# Patient Record
Sex: Female | Born: 1995 | Race: Black or African American | Hispanic: No | Marital: Single | Smoking: Former smoker
Health system: Southern US, Community
[De-identification: ages and names within clinical notes are randomized; demographics above are authoritative.]

## PROBLEM LIST (undated history)

## (undated) ENCOUNTER — Inpatient Hospital Stay: Payer: Self-pay

## (undated) DIAGNOSIS — R519 Headache, unspecified: Secondary | ICD-10-CM

## (undated) DIAGNOSIS — R109 Unspecified abdominal pain: Secondary | ICD-10-CM

## (undated) DIAGNOSIS — J45909 Unspecified asthma, uncomplicated: Secondary | ICD-10-CM

## (undated) DIAGNOSIS — K5900 Constipation, unspecified: Secondary | ICD-10-CM

## (undated) DIAGNOSIS — Z8619 Personal history of other infectious and parasitic diseases: Secondary | ICD-10-CM

## (undated) DIAGNOSIS — K602 Anal fissure, unspecified: Secondary | ICD-10-CM

## (undated) DIAGNOSIS — R51 Headache: Secondary | ICD-10-CM

## (undated) HISTORY — DX: Constipation, unspecified: K59.00

## (undated) HISTORY — DX: Personal history of other infectious and parasitic diseases: Z86.19

## (undated) HISTORY — DX: Unspecified abdominal pain: R10.9

## (undated) HISTORY — DX: Anal fissure, unspecified: K60.2

## (undated) HISTORY — PX: KNEE SURGERY: SHX244

---
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
## 2016-01-08 ENCOUNTER — Ambulatory Visit (INDEPENDENT_AMBULATORY_CARE_PROVIDER_SITE_OTHER): Payer: BLUE CROSS/BLUE SHIELD

## 2016-01-08 ENCOUNTER — Encounter: Payer: BLUE CROSS/BLUE SHIELD | Admitting: Obstetrics and Gynecology

## 2016-01-08 DIAGNOSIS — Z3492 Encounter for supervision of normal pregnancy, unspecified, second trimester: Secondary | ICD-10-CM | POA: Diagnosis not present

## 2016-01-15 ENCOUNTER — Encounter: Payer: BLUE CROSS/BLUE SHIELD | Admitting: Obstetrics and Gynecology

## 2016-01-24 ENCOUNTER — Encounter: Payer: Self-pay | Admitting: Emergency Medicine

## 2016-01-24 ENCOUNTER — Emergency Department
Admission: EM | Admit: 2016-01-24 | Discharge: 2016-01-24 | Disposition: A | Discharge status: Home / Self Care | Payer: BLUE CROSS/BLUE SHIELD | Attending: Emergency Medicine | Admitting: Emergency Medicine

## 2016-01-24 DIAGNOSIS — Z79899 Other long term (current) drug therapy: Secondary | ICD-10-CM | POA: Diagnosis not present

## 2016-01-24 DIAGNOSIS — J45909 Unspecified asthma, uncomplicated: Secondary | ICD-10-CM | POA: Diagnosis not present

## 2016-01-24 DIAGNOSIS — R55 Syncope and collapse: Secondary | ICD-10-CM | POA: Diagnosis not present

## 2016-01-24 DIAGNOSIS — M545 Low back pain: Secondary | ICD-10-CM | POA: Insufficient documentation

## 2016-01-24 DIAGNOSIS — O26892 Other specified pregnancy related conditions, second trimester: Secondary | ICD-10-CM | POA: Diagnosis present

## 2016-01-24 DIAGNOSIS — Z3A21 21 weeks gestation of pregnancy: Secondary | ICD-10-CM | POA: Insufficient documentation

## 2016-01-24 LAB — BASIC METABOLIC PANEL
Anion gap: 7 (ref 5–15)
BUN: 7 mg/dL (ref 6–20)
CHLORIDE: 104 mmol/L (ref 101–111)
CO2: 23 mmol/L (ref 22–32)
CREATININE: 0.6 mg/dL (ref 0.44–1.00)
Calcium: 9.1 mg/dL (ref 8.9–10.3)
Glucose, Bld: 113 mg/dL — ABNORMAL HIGH (ref 65–99)
POTASSIUM: 3.8 mmol/L (ref 3.5–5.1)
SODIUM: 134 mmol/L — AB (ref 135–145)

## 2016-01-24 LAB — URINALYSIS COMPLETE WITH MICROSCOPIC (ARMC ONLY)
Bacteria, UA: NONE SEEN
Bilirubin Urine: NEGATIVE
Glucose, UA: NEGATIVE mg/dL
HGB URINE DIPSTICK: NEGATIVE
KETONES UR: NEGATIVE mg/dL
LEUKOCYTES UA: NEGATIVE
NITRITE: NEGATIVE
PH: 7 (ref 5.0–8.0)
PROTEIN: NEGATIVE mg/dL
RBC / HPF: NONE SEEN RBC/hpf (ref 0–5)
SPECIFIC GRAVITY, URINE: 1.001 — AB (ref 1.005–1.030)
SQUAMOUS EPITHELIAL / LPF: NONE SEEN
WBC, UA: NONE SEEN WBC/hpf (ref 0–5)

## 2016-01-24 LAB — CBC
HCT: 33.6 % — ABNORMAL LOW (ref 35.0–47.0)
Hemoglobin: 10.8 g/dL — ABNORMAL LOW (ref 12.0–16.0)
MCH: 23.3 pg — ABNORMAL LOW (ref 26.0–34.0)
MCHC: 32.1 g/dL (ref 32.0–36.0)
MCV: 72.7 fL — AB (ref 80.0–100.0)
PLATELETS: 275 10*3/uL (ref 150–440)
RBC: 4.62 MIL/uL (ref 3.80–5.20)
RDW: 17.1 % — ABNORMAL HIGH (ref 11.5–14.5)
WBC: 13.5 10*3/uL — AB (ref 3.6–11.0)

## 2016-01-24 NOTE — Discharge Instructions (Signed)
Please drink plenty of fluids. Please eat many small snacks throughout the day. Please follow-up with your OB/GYN as soon as possible for repeat evaluation. Return to the emergency department for any further syncopal events over (passing out) or if you develop any chest pain, trouble breathing, or any other symptom personally concerning to yourself.

## 2016-01-24 NOTE — ED Provider Notes (Addendum)
[facility] Emergency Department Provider Note  Time seen: 8:18 PM  I have reviewed the triage vital signs and the nursing notes.   HISTORY  Chief Complaint Loss of Consciousness    HPI [name] [name] is a 20 y.o. female G1 P0 at 21 weeks he presents the emergency departmentafter a syncopal episode. According to the patient she was at work around 5 PM she began feeling lightheaded and dizzy. She sat down and his symptoms seemed to go away. However approximately 10 minutes later after standing she once again felt lightheaded and dizzy and had a syncopal episode. Patient denies passing out in the past. Denies any chest pain or shortness of breath at any point. Denies any abdominal pain or vaginal bleeding or fluid leakage. Denies dysuria.  Past Medical History:  Diagnosis Date  . Abdominal pain   . Anal fissure   . Asthma   . Constipation   . H/O chlamydia infection 10/30/2015    Patient Active Problem List   Diagnosis Date Noted  . Positive urine drug screen 11/19/2015  . H/O chlamydia infection 10/30/2015  . Asthma affecting pregnancy, antepartum 10/30/2015  . Bilateral lower abdominal pain   . Constipation   . Anal fissure     Past Surgical History:  Procedure Laterality Date  . KNEE SURGERY Left 2014, 2015    Prior to Admission medications   Medication Sig Start Date End Date Taking? Authorizing Provider  acetaminophen (TYLENOL) 325 MG tablet Take 2 tablets (650 mg total) by mouth every 4 (four) hours as needed. 07/02/15 07/01/16  Jami L Hagler, PA-C  albuterol (PROVENTIL HFA;VENTOLIN HFA) 108 (90 Base) MCG/ACT inhaler Inhale 1-2 puffs into the lungs every 4 (four) hours as needed for wheezing or shortness of breath. 10/30/15   Hildred LaserAnika Cherry, MD  Prenat-FeFmCb-DSS-FA-DHA w/o A (CITRANATAL HARMONY) 27-1-260 MG CAPS Take 27 mg by mouth daily. 10/30/15   Hildred LaserAnika Cherry, MD    Allergies  Allergen Reactions  . Ibuprofen Nausea Only    Family History   Problem Relation Age of Onset  . Cancer Sister   . Seizures Sister   . Migraines Mother   . Diabetes Paternal Grandmother   . Celiac disease Neg Hx   . Cholelithiasis Neg Hx   . Ulcers Neg Hx     Social History Social History  Substance Use Topics  . Smoking status: Never Smoker  . Smokeless tobacco: Never Used  . Alcohol use No    Review of Systems Constitutional: Negative for fever Cardiovascular: Negative for chest pain. Respiratory: Negative for shortness of breath. Gastrointestinal: Negative for abdominal pain Genitourinary: Negative for dysuria. Musculoskeletal: Mild right lower back pain Neurological: Negative for headache 10-point ROS otherwise negative.  ____________________________________________   PHYSICAL EXAM:  VITAL SIGNS: ED Triage Vitals  Enc Vitals Group     BP 01/24/16 1852 (!) 122/58     Pulse Rate 01/24/16 1852 84     Resp 01/24/16 1852 15     Temp 01/24/16 1852 97.8 F (36.6 C)     Temp Source 01/24/16 1852 Oral     SpO2 01/24/16 1852 100 %     Weight 01/24/16 1853 150 lb (68 kg)     Height 01/24/16 1853 5\' 3"  (1.6 m)     Head Circumference --      Peak Flow --      Pain Score 01/24/16 1852 8     Pain Loc --      Pain Edu? --  Excl. in GC? --     Constitutional: Alert and oriented. Well appearing and in no distress. Eyes: Normal exam ENT   Head: Normocephalic and atraumatic.   Mouth/Throat: Mucous membranes are moist. Cardiovascular: Normal rate, regular rhythm. No murmur Respiratory: Normal respiratory effort without tachypnea nor retractions. Breath sounds are clear  Gastrointestinal: Soft and nontender. Gravid abdomen. Musculoskeletal: Mild tenderness palpation of patient's right lower back, mild erythema to believe she hit something when she passed out. No obvious ecchymosis. Minimal tenderness. Neurologic:  Normal speech and language. No gross focal neurologic deficits Skin:  Skin is warm, dry and intact.   Psychiatric: Mood and affect are normal. Speech and behavior are normal.   ____________________________________________  EKG reviewed and interpreted by myself shows normal sinus rhythm 87 bpm, narrow QRS, normal axis, normal intervals, no concerning ST changes.  INITIAL IMPRESSION / ASSESSMENT AND PLAN / ED COURSE  Pertinent labs & imaging results that were available during my care of the patient were reviewed by me and considered in my medical decision making (see chart for details).  Patient presents the emergency department after a syncopal episode. Patient's labs are largely at her baseline. Mild anemia, but again largely unchanged. Bedside ultrasound performed by myself shows a normal fetal heart rate of 150 bpm on M-mode. Good fetal movement, overall normal exam. We will check a urinalysis. I discussed with the patient importance of adequately hydrating during her second trimester. I also discussed the importance of sitting down if she feels lightheaded or dizzy. Patient is agreeable follow up with her OB.  Urinalysis is negative. Patient will be discharged home with OB follow-up. Patient and mother are agreeable.  ____________________________________________   FINAL CLINICAL IMPRESSION(S) / ED DIAGNOSES  Syncope    Minna AntisKevin Jasiel Belisle, MD 01/24/16 2050    Minna AntisKevin James Senn, MD 01/24/16 2053

## 2016-01-24 NOTE — ED Notes (Addendum)
Pt. States while standing and feeling light headed and weak, so sat and resting about 10 minutes, felt fine, got up and about 15 minutes later had a syncopal episode with no warning prior to as in the first episode.

## 2016-01-24 NOTE — ED Triage Notes (Signed)
Pt to triage via w/c with no distress noted; reports PTA while at work had syncopal episode x 2, 15min apart; c/o generalized HA at present; denies hx of same; [redacted]wks pregnant; G1, [facility]; denies any pregnancy c/o

## 2016-01-24 NOTE — ED Notes (Signed)
Pt. Verbalizes understanding of d/c instructions and follow-up. VS stable and pt denies pain.  Pt. In NAD at time of d/c and denies further concerns regarding this visit. Pt. Stable at the time of departure from the unit, departing unit by the safest and most appropriate manner per that pt condition and limitations. Pt advised to return to the ED at any time for emergent concerns, or for new/worsening symptoms.   

## 2016-01-25 ENCOUNTER — Ambulatory Visit (INDEPENDENT_AMBULATORY_CARE_PROVIDER_SITE_OTHER): Payer: BLUE CROSS/BLUE SHIELD | Admitting: Obstetrics and Gynecology

## 2016-01-25 VITALS — BP 121/71 | HR 93 | Wt 156.6 lb

## 2016-01-25 DIAGNOSIS — R51 Headache: Secondary | ICD-10-CM

## 2016-01-25 DIAGNOSIS — Z3492 Encounter for supervision of normal pregnancy, unspecified, second trimester: Secondary | ICD-10-CM

## 2016-01-25 DIAGNOSIS — O26892 Other specified pregnancy related conditions, second trimester: Secondary | ICD-10-CM

## 2016-01-25 DIAGNOSIS — R519 Headache, unspecified: Secondary | ICD-10-CM

## 2016-01-25 LAB — POCT URINALYSIS DIPSTICK
Bilirubin, UA: NEGATIVE
Glucose, UA: NEGATIVE
Ketones, UA: NEGATIVE
Leukocytes, UA: NEGATIVE
NITRITE UA: NEGATIVE
PH UA: 6.5
RBC UA: NEGATIVE
SPEC GRAV UA: 1.015
UROBILINOGEN UA: 0.2

## 2016-01-25 MED ORDER — BUTALBITAL-APAP-CAFFEINE 50-325-40 MG PO CAPS: 1.0000 | ORAL_CAPSULE | Freq: Four times a day (QID) | ORAL | 3 refills | Status: DC | PRN

## 2016-01-25 NOTE — Progress Notes (Signed)
ROB- reports feeling better since yesterday except for persistent HA, has been having them every other day in pregnancy- RX for Fioricet given. Reviewed normal anatomy scan.

## 2016-01-25 NOTE — Patient Instructions (Signed)

## 2016-01-25 NOTE — Progress Notes (Signed)
ROB-pt passed out yesterday @ work x 2 , went to ER, she is having some headaches

## 2016-02-29 ENCOUNTER — Ambulatory Visit (INDEPENDENT_AMBULATORY_CARE_PROVIDER_SITE_OTHER): Payer: BLUE CROSS/BLUE SHIELD | Admitting: Obstetrics and Gynecology

## 2016-02-29 VITALS — BP 118/70 | HR 92 | Wt 164.2 lb

## 2016-02-29 DIAGNOSIS — Z3492 Encounter for supervision of normal pregnancy, unspecified, second trimester: Secondary | ICD-10-CM

## 2016-02-29 LAB — POCT URINALYSIS DIPSTICK
Bilirubin, UA: NEGATIVE
Blood, UA: NEGATIVE
Glucose, UA: NEGATIVE
Ketones, UA: NEGATIVE
Nitrite, UA: NEGATIVE
PH UA: 6.5
SPEC GRAV UA: 1.015
UROBILINOGEN UA: 0.2

## 2016-02-29 NOTE — Progress Notes (Signed)
ROB- pt is doing well, having some low back pain

## 2016-02-29 NOTE — Progress Notes (Signed)
ROB-pt doing well. C/O of intermittent back pain. Discussed home care measures including abdominal support, proper body mechanics, and use of OTC medications. Glucola and H/H next visit, pt aware.

## 2016-03-14 ENCOUNTER — Other Ambulatory Visit: Payer: BLUE CROSS/BLUE SHIELD

## 2016-03-14 ENCOUNTER — Ambulatory Visit (INDEPENDENT_AMBULATORY_CARE_PROVIDER_SITE_OTHER): Payer: BLUE CROSS/BLUE SHIELD | Admitting: Certified Nurse Midwife

## 2016-03-14 VITALS — BP 121/64 | HR 82 | Wt 165.4 lb

## 2016-03-14 DIAGNOSIS — Z131 Encounter for screening for diabetes mellitus: Secondary | ICD-10-CM

## 2016-03-14 DIAGNOSIS — Z23 Encounter for immunization: Secondary | ICD-10-CM | POA: Diagnosis not present

## 2016-03-14 DIAGNOSIS — Z3493 Encounter for supervision of normal pregnancy, unspecified, third trimester: Secondary | ICD-10-CM

## 2016-03-14 NOTE — Progress Notes (Signed)
ROB-Pt doing well. C/o round ligament pain. Reviewed home care measures like proper body mechanics and abdominal support. Discussed daily kick counts, danger signs, and when to call. Class schedule and cord blood banking information given. Pt for A1C and H/H today. RTC x 2 weeks or sooner if needed.

## 2016-03-14 NOTE — Addendum Note (Signed)
Addended by: Brooke DareSICK, Lenia Housley L on: 03/14/2016 10:25 AM   Modules accepted: Orders

## 2016-03-14 NOTE — Patient Instructions (Signed)
Introduction Patient Name: ________________________________________________ Patient Due Date: ____________________ What is a fetal movement count? A fetal movement count is the number of times that you feel your baby move during a certain amount of time. This may also be called a fetal kick count. A fetal movement count is recommended for every pregnant woman. You may be asked to start counting fetal movements as early as week 28 of your pregnancy. Pay attention to when your baby is most active. You may notice your baby's sleep and wake cycles. You may also notice things that make your baby move more. You should do a fetal movement count:  When your baby is normally most active.  At the same time each day. A good time to count movements is while you are resting, after having something to eat and drink. How do I count fetal movements? 1. Find a quiet, comfortable area. Sit, or lie down on your side. 2. Write down the date, the start time and stop time, and the number of movements that you felt between those two times. Take this information with you to your health care visits. 3. For 2 hours, count kicks, flutters, swishes, rolls, and jabs. You should feel at least 10 movements during 2 hours. 4. You may stop counting after you have felt 10 movements. 5. If you do not feel 10 movements in 2 hours, have something to eat and drink. Then, keep resting and counting for 1 hour. If you feel at least 4 movements during that hour, you may stop counting. Contact a health care provider if:  You feel fewer than 4 movements in 2 hours.  Your baby is not moving like he or she usually does. Date: ____________ Start time: ____________ Stop time: ____________ Movements: ____________ Date: ____________ Start time: ____________ Stop time: ____________ Movements: ____________ Date: ____________ Start time: ____________ Stop time: ____________ Movements: ____________ Date: ____________ Start time: ____________  Stop time: ____________ Movements: ____________ Date: ____________ Start time: ____________ Stop time: ____________ Movements: ____________ Date: ____________ Start time: ____________ Stop time: ____________ Movements: ____________ Date: ____________ Start time: ____________ Stop time: ____________ Movements: ____________ Date: ____________ Start time: ____________ Stop time: ____________ Movements: ____________ Date: ____________ Start time: ____________ Stop time: ____________ Movements: ____________ This information is not intended to replace advice given to you by your health care provider. Make sure you discuss any questions you have with your health care provider. Document Released: 04/02/2006 Document Revised: 10/31/2015 Document Reviewed: 04/12/2015 Elsevier Interactive Patient Education  2017 [employer]    Preterm Labor and Birth Information Pregnancy normally lasts 39-41 weeks. Preterm labor is when labor starts early. It starts before you have been pregnant for 37 whole weeks. What are the risk factors for preterm labor? Preterm labor is more likely to occur in women who:  Have an infection while pregnant.  Have a cervix that is short.  Have gone into preterm labor before.  Have had surgery on their cervix.  Are younger than age 19.  Are older than age 435.  Are African American.  Are pregnant with two or more babies.  Take street drugs while pregnant.  Smoke while pregnant.  Do not gain enough weight while pregnant.  Got pregnant right after another pregnancy. What are the symptoms of preterm labor? Symptoms of preterm labor include:  Cramps. The cramps may feel like the cramps some women get during their period. The cramps may happen with watery poop (diarrhea).  Pain in the belly (abdomen).  Pain in the lower back.  Regular contractions or tightening. It may feel like your belly is getting tighter.  Pressure in the lower belly that seems to get  stronger.  More fluid (discharge) leaking from the vagina. The fluid may be watery or bloody.  Water breaking. Why is it important to notice signs of preterm labor? Babies who are born early may not be fully developed. They have a higher chance for:  Long-term heart problems.  Long-term lung problems.  Trouble controlling body systems, like breathing.  Bleeding in the brain.  A condition called cerebral palsy.  Learning difficulties.  Death. These risks are highest for babies who are born before 34 weeks of pregnancy. How is preterm labor treated? Treatment depends on:  How long you were pregnant.  Your condition.  The health of your baby. Treatment may involve:  Having a stitch (suture) placed in your cervix. When you give birth, your cervix opens so the baby can come out. The stitch keeps the cervix from opening too soon.  Staying at the hospital.  Taking or getting medicines, such as:  Hormone medicines.  Medicines to stop contractions.  Medicines to help the baby's lungs develop.  Medicines to prevent your baby from having cerebral palsy. What should I do if I am in preterm labor? If you think you are going into labor too soon, call your doctor right away. How can I prevent preterm labor?  Do not use any tobacco products.  Examples of these are cigarettes, chewing tobacco, and e-cigarettes.  If you need help quitting, ask your doctor.  Do not use street drugs.  Do not use any medicines unless you ask your doctor if they are safe for you.  Talk with your doctor before taking any herbal supplements.  Make sure you gain enough weight.  Watch for infection. If you think you might have an infection, get it checked right away.  If you have gone into preterm labor before, tell your doctor. This information is not intended to replace advice given to you by your health care provider. Make sure you discuss any questions you have with your health care  provider. Document Released: 05/30/2008 Document Revised: 08/14/2015 Document Reviewed: 07/25/2015 Elsevier Interactive Patient Education  2017 [employer]   Third Trimester of Pregnancy The third trimester is from week 29 through week 42, months 7 through 9. This trimester is when your unborn baby (fetus) is growing very fast. At the end of the ninth month, the unborn baby is about 20 inches in length. It weighs about 6-10 pounds. Follow these instructions at home:  Avoid all smoking, herbs, and alcohol. Avoid drugs not approved by your doctor.  Do not use any tobacco products, including cigarettes, chewing tobacco, and electronic cigarettes. If you need help quitting, ask your doctor. You may get counseling or other support to help you quit.  Only take medicine as told by your doctor. Some medicines are safe and some are not during pregnancy.  Exercise only as told by your doctor. Stop exercising if you start having cramps.  Eat regular, healthy meals.  Wear a good support bra if your breasts are tender.  Do not use hot tubs, steam rooms, or saunas.  Wear your seat belt when driving.  Avoid raw meat, uncooked cheese, and liter boxes and soil used by cats.  Take your prenatal vitamins.  Take 1500-2000 milligrams of calcium daily starting at the 20th week of pregnancy until you deliver your baby.  Try taking medicine that helps you poop (  stool softener) as needed, and if your doctor approves. Eat more fiber by eating fresh fruit, vegetables, and whole grains. Drink enough fluids to keep your pee (urine) clear or pale yellow.  Take warm water baths (sitz baths) to soothe pain or discomfort caused by hemorrhoids. Use hemorrhoid cream if your doctor approves.  If you have puffy, bulging veins (varicose veins), wear support hose. Raise (elevate) your feet for 15 minutes, 3-4 times a day. Limit salt in your diet.  Avoid heavy lifting, wear low heels, and sit up straight.  Rest  with your legs raised if you have leg cramps or low back pain.  Visit your dentist if you have not gone during your pregnancy. Use a soft toothbrush to brush your teeth. Be gentle when you floss.  You can have sex (intercourse) unless your doctor tells you not to.  Do not travel far distances unless you must. Only do so with your doctor's approval.  Take prenatal classes.  Practice driving to the hospital.  Pack your hospital bag.  Prepare the baby's room.  Go to your doctor visits. Get help if:  You are not sure if you are in labor or if your water has broken.  You are dizzy.  You have mild cramps or pressure in your lower belly (abdominal).  You have a nagging pain in your belly area.  You continue to feel sick to your stomach (nauseous), throw up (vomit), or have watery poop (diarrhea).  You have bad smelling fluid coming from your vagina.  You have pain with peeing (urination). Get help right away if:  You have a fever.  You are leaking fluid from your vagina.  You are spotting or bleeding from your vagina.  You have severe belly cramping or pain.  You lose or gain weight rapidly.  You have trouble catching your breath and have chest pain.  You notice sudden or extreme puffiness (swelling) of your face, hands, ankles, feet, or legs.  You have not felt the baby move in over an hour.  You have severe headaches that do not go away with medicine.  You have vision changes. This information is not intended to replace advice given to you by your health care provider. Make sure you discuss any questions you have with your health care provider. Document Released: 05/28/2009 Document Revised: 08/09/2015 Document Reviewed: 05/04/2012 Elsevier Interactive Patient Education  2017 [employer].

## 2016-03-15 LAB — HEMOGLOBIN AND HEMATOCRIT, BLOOD
Hematocrit: 38.7 % (ref 34.0–46.6)
Hemoglobin: 13.6 g/dL (ref 11.1–15.9)

## 2016-03-15 LAB — GLUCOSE, 1 HOUR GESTATIONAL: Gestational Diabetes Screen: 111 mg/dL (ref 65–139)

## 2016-03-17 NOTE — L&D Delivery Note (Signed)
Delivery Note   Pt complete and ready to push. Coached maternal pushing efforts effective.   At 3:44 AM a viable female was delivered. Infant placed immediately to maternal abdomen. Receiving Nurse and NNP present at delivery. Delayed cord clamping, three (3) vessel cord, cord blood collected. APGAR: 8, 9; weight 8 lb 10.6 oz (3930 g). Spontaneous delivery of placenta at 0349.   Anesthesia:  Epidural Episiotomy: None Lacerations: Left labial repaired; first degree laceration hemostatic-unrepaired Suture Repair: vicryl rapide Est. Blood Loss (mL):  300  Mom to postpartum.  Baby to Couplet care / Skin to Skin.  [name] RoyalsMichelle Lawhorn 06/06/2016, 4:21 AM

## 2016-03-28 ENCOUNTER — Ambulatory Visit (INDEPENDENT_AMBULATORY_CARE_PROVIDER_SITE_OTHER): Payer: BLUE CROSS/BLUE SHIELD | Admitting: Certified Nurse Midwife

## 2016-03-28 VITALS — BP 116/56 | HR 88 | Wt 172.1 lb

## 2016-03-28 DIAGNOSIS — Z3493 Encounter for supervision of normal pregnancy, unspecified, third trimester: Secondary | ICD-10-CM

## 2016-03-28 LAB — POCT URINALYSIS DIPSTICK
Bilirubin, UA: NEGATIVE
Blood, UA: NEGATIVE
GLUCOSE UA: NEGATIVE
Ketones, UA: NEGATIVE
LEUKOCYTES UA: NEGATIVE
NITRITE UA: NEGATIVE
Protein, UA: NEGATIVE
Spec Grav, UA: 1.015
UROBILINOGEN UA: NEGATIVE
pH, UA: 5

## 2016-03-28 NOTE — Progress Notes (Signed)
ROB-Pt doing well. Work note given for frequent breaks and no prolonged standing. Reviewed danger signs and when to call. Discussed use of US in normal pregnancy. Class schedule and cord blood banking information given again. RTC x 2 wks or sooner if needed.

## 2016-03-28 NOTE — Progress Notes (Signed)
Pt has no c/o

## 2016-04-06 ENCOUNTER — Encounter: Payer: Self-pay | Admitting: *Deleted

## 2016-04-06 ENCOUNTER — Inpatient Hospital Stay
Admission: EM | Admit: 2016-04-06 | Discharge: 2016-04-07 | Disposition: A | Discharge status: Home / Self Care | Payer: BLUE CROSS/BLUE SHIELD | Attending: Obstetrics & Gynecology | Admitting: Obstetrics & Gynecology

## 2016-04-06 DIAGNOSIS — O99513 Diseases of the respiratory system complicating pregnancy, third trimester: Secondary | ICD-10-CM | POA: Diagnosis not present

## 2016-04-06 DIAGNOSIS — R1032 Left lower quadrant pain: Secondary | ICD-10-CM | POA: Insufficient documentation

## 2016-04-06 DIAGNOSIS — O26893 Other specified pregnancy related conditions, third trimester: Secondary | ICD-10-CM | POA: Insufficient documentation

## 2016-04-06 DIAGNOSIS — Z3A31 31 weeks gestation of pregnancy: Secondary | ICD-10-CM | POA: Diagnosis not present

## 2016-04-06 DIAGNOSIS — R1031 Right lower quadrant pain: Secondary | ICD-10-CM | POA: Insufficient documentation

## 2016-04-06 DIAGNOSIS — J45909 Unspecified asthma, uncomplicated: Secondary | ICD-10-CM | POA: Diagnosis not present

## 2016-04-06 DIAGNOSIS — Z8619 Personal history of other infectious and parasitic diseases: Secondary | ICD-10-CM | POA: Insufficient documentation

## 2016-04-06 DIAGNOSIS — O4703 False labor before 37 completed weeks of gestation, third trimester: Secondary | ICD-10-CM | POA: Diagnosis present

## 2016-04-06 HISTORY — DX: Headache, unspecified: R51.9

## 2016-04-06 HISTORY — DX: Headache: R51

## 2016-04-06 LAB — FETAL FIBRONECTIN: Fetal Fibronectin: NEGATIVE

## 2016-04-06 LAB — URINALYSIS, ROUTINE W REFLEX MICROSCOPIC
Bilirubin Urine: NEGATIVE
GLUCOSE, UA: NEGATIVE mg/dL
Hgb urine dipstick: NEGATIVE
KETONES UR: 5 mg/dL — AB
Nitrite: NEGATIVE
PROTEIN: NEGATIVE mg/dL
Specific Gravity, Urine: 1.002 — ABNORMAL LOW (ref 1.005–1.030)
pH: 7 (ref 5.0–8.0)

## 2016-04-06 MED ORDER — LACTATED RINGERS IV SOLN
INTRAVENOUS | Status: DC
  Administered 2016-04-06: 20:00:00 via INTRAVENOUS

## 2016-04-06 MED ORDER — TERBUTALINE SULFATE 1 MG/ML IJ SOLN
0.2500 mg | Freq: Once | INTRAMUSCULAR | Status: AC
  Administered 2016-04-06: 0.25 mg via SUBCUTANEOUS
  Filled 2016-04-06: qty 1

## 2016-04-06 NOTE — Discharge Summary (Signed)
[name] [name] is a 21 y.o. G1P0 at 6669w4d who is admitted for preterm contractions.  Estimated Date of Delivery: 06/04/16 Fetal presentation is unsure.  Length of Stay:  0 Days. Admitted 04/06/2016  Subjective: Reports no pain or cramping since tocolytics given Patient reports good fetal movement.  She reports no uterine contractions, no bleeding and no loss of fluid per vagina.  Vitals:  Blood pressure 121/64, pulse 86, temperature 98.2 F (36.8 C), temperature source Oral, resp. rate 16, height 5\' 3"  (1.6 m), weight 172 lb (78 kg), last menstrual period 08/29/2015. Physical Examination: CONSTITUTIONAL: Well-developed, well-nourished female in no acute distress.  SKIN: Skin is warm and dry. No rash noted. Not diaphoretic. No erythema. No pallor. NEUROLGIC: Alert and oriented to person, place, and time. Normal reflexes, muscle tone coordination. No cranial nerve deficit noted. PSYCHIATRIC: Normal mood and affect. Normal behavior. Normal judgment and thought content. CARDIOVASCULAR: Normal heart rate noted, regular rhythm RESPIRATORY: Effort and breath sounds normal, no problems with respiration noted MUSCULOSKELETAL: Normal range of motion. No edema and no tenderness. 2+ distal pulses. ABDOMEN: Soft, nontender, nondistended, gravid. CERVIX: Dilation: Fingertip Effacement (%): Thick Exam by:: m.Larsen Dungan cnm  Fetal monitoring: FHR: 1** bpm, Variability: moderate, Accelerations: Present, Decelerations: Absent  Uterine activity: none in last hour, previously q4-5 minutes. contractions per hour  Results for orders placed or performed during the hospital encounter of 04/06/16 (from the past 48 hour(s))  Urinalysis, Routine w reflex microscopic     Status: Abnormal   Collection Time: 04/06/16  6:33 PM  Result Value Ref Range   Color, Urine STRAW (A) YELLOW   APPearance HAZY (A) CLEAR   Specific Gravity, Urine 1.002 (L) 1.005 - 1.030   pH 7.0 5.0 - 8.0   Glucose, UA NEGATIVE NEGATIVE mg/dL   Hgb urine dipstick NEGATIVE NEGATIVE   Bilirubin Urine NEGATIVE NEGATIVE   Ketones, ur 5 (A) NEGATIVE mg/dL   Protein, ur NEGATIVE NEGATIVE mg/dL   Nitrite NEGATIVE NEGATIVE   Leukocytes, UA LARGE (A) NEGATIVE   RBC / HPF 6-30 0 - 5 RBC/hpf   WBC, UA TOO NUMEROUS TO COUNT 0 - 5 WBC/hpf   Bacteria, UA RARE (A) NONE SEEN   Squamous Epithelial / LPF 0-5 (A) NONE SEEN   Amorphous Crystal PRESENT   Fetal fibronectin     Status: Abnormal   Collection Time: 04/06/16  7:14 PM  Result Value Ref Range   Fetal Fibronectin NEGATIVE NEGATIVE   Appearance, FETFIB CLEAR (A) CLEAR    No results found.  Current scheduled medications   I have reviewed the patient's current medications.  ASSESSMENT: Patient Active Problem List   Diagnosis Date Noted  . Headache in pregnancy, antepartum, second trimester 01/25/2016  . Positive urine drug screen 11/19/2015  . H/O chlamydia infection 10/30/2015  . Asthma affecting pregnancy, antepartum 10/30/2015  . Bilateral lower abdominal pain   . Constipation   . Anal fissure     PLAN:  Continue routine antenatal  Pelvic rest until futher notice  Shawnelle Spoerl Suzan NailerN Stockton Nunley, CNM [facility]

## 2016-04-06 NOTE — OB Triage Provider Note (Signed)
Subjective:  [name] [name] is a 21 y.o. G1 P0 female with EDC 06/04/16 at 331 and 4/[redacted] weeks gestation who is being admitted for observation, Preterm labor.  Her current obstetrical history is significant for nothing to date.  Patient reports bleeding, occasional contractions and decreased appetite since nine pm last night after she got off work. noted a small amount dark blood on tissue and none since, but woke up with intermittent cramping all day. denies sex in over a week, and nothing else different..   Fetal Movement: normal.     Objective:   Vital signs in last 24 hours: Temp:  [98.3 F (36.8 C)] 98.3 F (36.8 C) (01/21 1812) Pulse Rate:  [86] 86 (01/21 1812) Resp:  [16] 16 (01/21 1812) BP: (121)/(64) 121/64 (01/21 1812) Weight:  [172 lb (78 kg)] 172 lb (78 kg) (01/21 1812)   General:   alert, cooperative and appears stated age  Skin:   normal  HEENT:  PERRLA  Lungs:   clear to auscultation bilaterally  Heart:   regular rate and rhythm, S1, S2 normal, no murmur, click, rub or gallop  Reflexes  No peripheral edema noted on exam.  Abdomen:  soft, non-tender; bowel sounds normal; no masses,  no organomegaly  Pelvis:  Vulva and vagina appear normal. Bimanual exam reveals normal uterus and adnexa. Cervix: 0.5/th/OOP  FHT:  148 BPM  Uterine Size: size equals dates  Presentations: unsure  Cervix:    Dilation: FT   Effacement: 100%   Station:  Floating   Consistency: medium   Position: middle   Lab Review  O, Rh+  AFP:NML  One hour GTT: Normal    Assessment/Plan:  31 and 4/[redacted] weeks gestation. Pre-term contractions Obstetrical history significant for none.     Risks, benefits, alternatives and possible complications have been discussed in detail with the patient.  Normal causes of pre-term contractions including UTI, vaginal infection, & dehydration discussed, will awaiting results of UA & FFN and treat accordingly/  All questions answered, all appropriate consents will be  signed at the Hospital. Admission is planned for ?Marland Kitchen.  Continue present management. and Intervention: IV fluid bolus and administer tocolytic      Cace Osorto Aura CampsShambley, CNM [facility]

## 2016-04-06 NOTE — OB Triage Note (Signed)
Patient states that she started having some maroon colored bleeding noted on toilet paper around 9pm Saturday night.  Blood not seen this morning.  Complains of lower abdominal pain that started when she noticed the blood.  She rates her pain 8/10. She took 650 mg Tylenol last night without any improvement.

## 2016-04-07 NOTE — Discharge Instructions (Signed)
Drink plenty of water, get plenty of rest. Maintain pelvic rest (nothing in vagina) until follow up appointment.

## 2016-04-07 NOTE — Progress Notes (Signed)
Bag of fluid complete and discharge orders received from provider. Patient denies pain at this time. IV removed. Discharge instructions reviewed with patient, patient verbalizes understanding of pelvic rest, will plan to be at follow up appointment Friday 04/11/2016. Patient denies needs at this time, ambulatory off unit in stable condition accompanied by mother.

## 2016-04-08 LAB — CULTURE, OB URINE

## 2016-04-11 ENCOUNTER — Ambulatory Visit (INDEPENDENT_AMBULATORY_CARE_PROVIDER_SITE_OTHER): Payer: BLUE CROSS/BLUE SHIELD | Admitting: Certified Nurse Midwife

## 2016-04-11 VITALS — BP 123/78 | HR 100 | Wt 175.7 lb

## 2016-04-11 DIAGNOSIS — Z3403 Encounter for supervision of normal first pregnancy, third trimester: Secondary | ICD-10-CM

## 2016-04-11 DIAGNOSIS — Z113 Encounter for screening for infections with a predominantly sexual mode of transmission: Secondary | ICD-10-CM

## 2016-04-11 LAB — POCT URINALYSIS DIPSTICK
Bilirubin, UA: NEGATIVE
Glucose, UA: NEGATIVE
KETONES UA: NEGATIVE
Nitrite, UA: NEGATIVE
SPEC GRAV UA: 1.01
UROBILINOGEN UA: NEGATIVE
pH, UA: 7

## 2016-04-11 NOTE — Patient Instructions (Addendum)
Sciatica Introduction Sciatica is pain, numbness, weakness, or tingling along your sciatic nerve. The sciatic nerve starts in the lower back and goes down the back of each leg. Sciatica happens when this nerve is pinched or has pressure put on it. Sciatica usually goes away on its own or with treatment. Sometimes, sciatica may keep coming back (recur). Follow these instructions at home: Medicines  Take over-the-counter and prescription medicines only as told by your doctor.  Do not drive or use heavy machinery while taking prescription pain medicine. Managing pain  If directed, put ice on the affected area.  Put ice in a plastic bag.  Place a towel between your skin and the bag.  Leave the ice on for 20 minutes, 2-3 times a day.  After icing, apply heat to the affected area before you exercise or as often as told by your doctor. Use the heat source that your doctor tells you to use, such as a moist heat pack or a heating pad.  Place a towel between your skin and the heat source.  Leave the heat on for 20-30 minutes.  Remove the heat if your skin turns bright red. This is especially important if you are unable to feel pain, heat, or cold. You may have a greater risk of getting burned. Activity  Return to your normal activities as told by your doctor. Ask your doctor what activities are safe for you.  Avoid activities that make your sciatica worse.  Take short rests during the day. Rest in a lying or standing position. This is usually better than sitting to rest.  When you rest for a long time, do some physical activity or stretching between periods of rest.  Avoid sitting for a long time without moving. Get up and move around at least one time each hour.  Exercise and stretch regularly, as told by your doctor.  Do not lift anything that is heavier than 10 lb (4.5 kg) while you have symptoms of sciatica.  Avoid lifting heavy things even when you do not have symptoms.  Avoid  lifting heavy things over and over.  When you lift objects, always lift in a way that is safe for your body. To do this, you should:  Bend your knees.  Keep the object close to your body.  Avoid twisting. General instructions  Use good posture.  Avoid leaning forward when you are sitting.  Avoid hunching over when you are standing.  Stay at a healthy weight.  Wear comfortable shoes that support your feet. Avoid wearing high heels.  Avoid sleeping on a mattress that is too soft or too hard. You might have less pain if you sleep on a mattress that is firm enough to support your back.  Keep all follow-up visits as told by your doctor. This is important. Contact a doctor if:  You have pain that:  Wakes you up when you are sleeping.  Gets worse when you lie down.  Is worse than the pain you have had in the past.  Lasts longer than 4 weeks.  You lose weight for without trying. Get help right away if:  You cannot control when you pee (urinate) or poop (have a bowel movement).  You have weakness in any of these areas and it gets worse.  Lower back.  Lower belly (pelvis).  Butt (buttocks).  Legs.  You have redness or swelling of your back.  You have a burning feeling when you pee. This information is not intended to   replace advice given to you by your health care provider. Make sure you discuss any questions you have with your health care provider. Document Released: 12/11/2007 Document Revised: 08/09/2015 Document Reviewed: 11/10/2014  2017 Elsevier  

## 2016-04-11 NOTE — Progress Notes (Signed)
ROB-Pt doing well. No contractions since Hospital visit. Possible exposure to Trich, requests STI testing. NuSwab collected. Reviewed red flag symptoms and when to call. RTC x 2 weeks.

## 2016-04-14 ENCOUNTER — Telehealth: Payer: Self-pay | Admitting: Certified Nurse Midwife

## 2016-04-14 LAB — NUSWAB VAGINITIS PLUS (VG+)
ATOPOBIUM VAGINAE: HIGH {score} — AB
CANDIDA ALBICANS, NAA: POSITIVE — AB
CANDIDA GLABRATA, NAA: NEGATIVE
Chlamydia trachomatis, NAA: NEGATIVE
MEGASPHAERA 1: HIGH {score} — AB
NEISSERIA GONORRHOEAE, NAA: NEGATIVE
Trich vag by NAA: POSITIVE — AB

## 2016-04-14 MED ORDER — METRONIDAZOLE 500 MG PO TABS: 500.0000 mg | ORAL_TABLET | Freq: Two times a day (BID) | ORAL | 0 refills | Status: AC

## 2016-04-14 MED ORDER — TERCONAZOLE 0.4 % VA CREA: 1.0000 | TOPICAL_CREAM | Freq: Every day | VAGINAL | 0 refills | Status: DC

## 2016-04-14 NOTE — Telephone Encounter (Signed)
Message left on cell phone per [employer] Release form.   NuSwab results and treatment reviewed.   Rx Flagy and Terazole.   Call with further needs, questions or concerns.    Gunnar BullaJenkins Michelle Niam Nepomuceno, CNM

## 2016-04-25 ENCOUNTER — Ambulatory Visit (INDEPENDENT_AMBULATORY_CARE_PROVIDER_SITE_OTHER): Payer: BLUE CROSS/BLUE SHIELD | Admitting: Obstetrics and Gynecology

## 2016-04-25 VITALS — BP 115/73 | HR 102 | Wt 179.9 lb

## 2016-04-25 DIAGNOSIS — Z3493 Encounter for supervision of normal pregnancy, unspecified, third trimester: Secondary | ICD-10-CM

## 2016-04-25 LAB — POCT URINALYSIS DIPSTICK
Bilirubin, UA: NEGATIVE
Blood, UA: NEGATIVE
GLUCOSE UA: NEGATIVE
KETONES UA: NEGATIVE
NITRITE UA: NEGATIVE
Spec Grav, UA: 1.02
Urobilinogen, UA: 0.2
pH, UA: 6

## 2016-04-25 NOTE — Progress Notes (Signed)
ROB-doing well, discussed asthma in labor, circumcision, postpartum BC, and breastfeeding.

## 2016-04-25 NOTE — Progress Notes (Signed)
ROB- pt is doing well, having some pelvic pressure 

## 2016-05-09 ENCOUNTER — Ambulatory Visit (INDEPENDENT_AMBULATORY_CARE_PROVIDER_SITE_OTHER): Payer: BLUE CROSS/BLUE SHIELD | Admitting: Obstetrics and Gynecology

## 2016-05-09 VITALS — BP 115/85 | HR 109 | Wt 182.2 lb

## 2016-05-09 DIAGNOSIS — Z3685 Encounter for antenatal screening for Streptococcus B: Secondary | ICD-10-CM

## 2016-05-09 DIAGNOSIS — Z113 Encounter for screening for infections with a predominantly sexual mode of transmission: Secondary | ICD-10-CM | POA: Diagnosis not present

## 2016-05-09 DIAGNOSIS — Z3493 Encounter for supervision of normal pregnancy, unspecified, third trimester: Secondary | ICD-10-CM

## 2016-05-09 LAB — POCT URINALYSIS DIPSTICK
Bilirubin, UA: NEGATIVE
Blood, UA: NEGATIVE
Glucose, UA: NEGATIVE
Ketones, UA: 40
LEUKOCYTES UA: NEGATIVE
NITRITE UA: NEGATIVE
PH UA: 6.5
Spec Grav, UA: 1.015
UROBILINOGEN UA: 0.2

## 2016-05-09 NOTE — Progress Notes (Signed)
ROB-reports onset of cold symptoms 2 days ago, taking robitussin. Denies PIH s/s, Ok to take tylenol PM and claritin and saline nasal spray.

## 2016-05-09 NOTE — Progress Notes (Signed)
ROB- cultures obtained, pts family has been sick, head stopped up, sinus drainage, coughing- non productive

## 2016-05-14 LAB — GC/CHLAMYDIA PROBE AMP
CHLAMYDIA, DNA PROBE: NEGATIVE
NEISSERIA GONORRHOEAE BY PCR: NEGATIVE

## 2016-05-15 ENCOUNTER — Other Ambulatory Visit: Payer: Self-pay | Admitting: Obstetrics and Gynecology

## 2016-05-15 LAB — STREP GP B NAA: Strep Gp B NAA: POSITIVE — AB

## 2016-05-16 ENCOUNTER — Encounter: Payer: Self-pay | Admitting: Certified Nurse Midwife

## 2016-05-16 ENCOUNTER — Ambulatory Visit (INDEPENDENT_AMBULATORY_CARE_PROVIDER_SITE_OTHER): Payer: BLUE CROSS/BLUE SHIELD | Admitting: Certified Nurse Midwife

## 2016-05-16 VITALS — BP 122/69 | HR 81 | Wt 185.4 lb

## 2016-05-16 DIAGNOSIS — Z3493 Encounter for supervision of normal pregnancy, unspecified, third trimester: Secondary | ICD-10-CM

## 2016-05-16 DIAGNOSIS — Z8619 Personal history of other infectious and parasitic diseases: Secondary | ICD-10-CM

## 2016-05-16 LAB — POCT URINALYSIS DIPSTICK
BILIRUBIN UA: NEGATIVE
Glucose, UA: NEGATIVE
KETONES UA: NEGATIVE
LEUKOCYTES UA: NEGATIVE
Nitrite, UA: NEGATIVE
PH UA: 5
PROTEIN UA: NEGATIVE
RBC UA: NEGATIVE
SPEC GRAV UA: 1.02
Urobilinogen, UA: NEGATIVE

## 2016-05-16 NOTE — Progress Notes (Signed)
ROB-Pt doing well. Reviewed 36 week cultures, GBS positive-plan of care discussed. TOC Trich collected and sent today. Reviewed s/s of labor and when to call. RTC x 1 week.

## 2016-05-16 NOTE — Patient Instructions (Signed)

## 2016-05-22 LAB — TRICHOMONAS VAGINALIS, PROBE AMP: TRICH VAG BY NAA: NEGATIVE

## 2016-05-23 ENCOUNTER — Encounter: Payer: Self-pay | Admitting: Obstetrics and Gynecology

## 2016-05-23 ENCOUNTER — Ambulatory Visit (INDEPENDENT_AMBULATORY_CARE_PROVIDER_SITE_OTHER): Payer: BLUE CROSS/BLUE SHIELD | Admitting: Obstetrics and Gynecology

## 2016-05-23 VITALS — BP 133/74 | HR 92 | Wt 187.4 lb

## 2016-05-23 DIAGNOSIS — Z3493 Encounter for supervision of normal pregnancy, unspecified, third trimester: Secondary | ICD-10-CM

## 2016-05-23 LAB — POCT URINALYSIS DIPSTICK
Bilirubin, UA: NEGATIVE
Blood, UA: NEGATIVE
GLUCOSE UA: NEGATIVE
KETONES UA: NEGATIVE
Leukocytes, UA: NEGATIVE
Nitrite, UA: NEGATIVE
Protein, UA: NEGATIVE
SPEC GRAV UA: 1.01
Urobilinogen, UA: 0.2
pH, UA: 7

## 2016-05-23 NOTE — Progress Notes (Signed)
ROB- pt is having a lot of pelvic pressure, having some contractions 

## 2016-05-23 NOTE — Progress Notes (Signed)
ROB-labor precautions discussed, note for work with start of maternity leave 05/19/16 and out for up to 12 weeks.

## 2016-05-26 ENCOUNTER — Observation Stay
Admission: EM | Admit: 2016-05-26 | Discharge: 2016-05-26 | Disposition: A | Discharge status: Home / Self Care | Payer: BLUE CROSS/BLUE SHIELD | Attending: Obstetrics and Gynecology | Admitting: Obstetrics and Gynecology

## 2016-05-26 DIAGNOSIS — Z3A38 38 weeks gestation of pregnancy: Secondary | ICD-10-CM

## 2016-05-26 DIAGNOSIS — O471 False labor at or after 37 completed weeks of gestation: Principal | ICD-10-CM | POA: Insufficient documentation

## 2016-05-26 DIAGNOSIS — O4703 False labor before 37 completed weeks of gestation, third trimester: Secondary | ICD-10-CM | POA: Diagnosis present

## 2016-05-26 NOTE — Discharge Planning (Cosign Needed)
@  LOGO@  L&D OB Triage Note  SUBJECTIVE [name] [name] is a 21 y.o. G1P0 female at 4455w5d, EDD Estimated Date of Delivery: 06/04/16 who presented to triage with complaints of contractions every 6 minutes. Denies loss of fluid, endorses positive fetal movement.  Obstetric History   G1   P0   T0   P0   A0   L0    SAB0   TAB0   Ectopic0   Multiple0   Live Births0     # Outcome Date GA Lbr Len/2nd Weight Sex Delivery Anes PTL Lv  1 Current             Obstetric Comments  LMP 08/29/2015 EDD 03/24/02018     Prescriptions Prior to Admission  Medication Sig Dispense Refill Last Dose  . acetaminophen (TYLENOL) 325 MG tablet Take 2 tablets (650 mg total) by mouth every 4 (four) hours as needed. 30 tablet 0 Past Week at Unknown time  . albuterol (PROVENTIL HFA;VENTOLIN HFA) 108 (90 Base) MCG/ACT inhaler Inhale 1-2 puffs into the lungs every 4 (four) hours as needed for wheezing or shortness of breath. 1 Inhaler 3 Past Week at Unknown time  . Butalbital-APAP-Caffeine 50-325-40 MG capsule Take 1-2 capsules by mouth every 6 (six) hours as needed for headache. 30 capsule 3 Past Week at Unknown time  . Prenat-FeFmCb-DSS-FA-DHA w/o A (CITRANATAL HARMONY) 27-1-260 MG CAPS Take 27 mg by mouth daily. 30 capsule 11 05/25/2016 at Unknown time  . terconazole (TERAZOL 7) 0.4 % vaginal cream Place 1 applicator vaginally at bedtime. (Patient not taking: Reported on 04/25/2016) 45 g 0 Not Taking     OBJECTIVE Nursing Evaluation: Ht 5\' 3"  (1.6 m)   Wt 187 lb (84.8 kg)   LMP 08/29/2015 (Approximate)   BMI 33.13 kg/m    Findings:  Irregular mild contractions. No cervical change from 05/23/16.   NST was performed and has been reviewed by Dr. Ardine BjorkeFransesco  (remote access to Epic not available)  NST INTERPRETATION:Category I  Mode: External   ASSESSMENT Impression:  1. Pregnancy:  G1P0 at 6555w5d , EDD Estimated Date of Delivery: 06/04/16 2.  reactive  PLAN 1. Reassurance given 2. Discharge home with precautions  to return to L&D or call the office if:  increased leakage or fluid, decreased fetal movement or bleeding from vaginal area, labor precautions reviewed. Comfort measures reviewed. 3. Continue routine prenatal care. Follow up at next scheduled visit.   Doreene BurkeAnnie Dontray Haberland, CNM

## 2016-05-26 NOTE — OB Triage Note (Signed)
Presents with complaint of contractions. She states she really hasnt timed them but htinks they are about every 6 minutes. No leaking fluid noted but states some spotting noticed.

## 2016-05-27 NOTE — Discharge Summary (Signed)
SUBJECTIVE [name] [name] is a 21 y.o. G1P0 female at [redacted]w[redacted]d, EDD Estimated Date of Delivery: 06/04/16 who presented to triage with complaints of contractions every 6 minutes. Denies loss of fluid, endorses positive fetal movement.              Obstetric History   G1   P0   T0   P0   A0   L0    SAB0   TAB0   Ectopic0   Multiple0   Live Births0     # Outcome Date GA Lbr Len/2nd Weight Sex Delivery Anes PTL Lv  1 Current             Obstetric Comments  LMP 08/29/2015 EDD 03/24/02018            Prescriptions Prior to Admission  Medication Sig Dispense Refill Last Dose  . acetaminophen (TYLENOL) 325 MG tablet Take 2 tablets (650 mg total) by mouth every 4 (four) hours as needed. 30 tablet 0 Past Week at Unknown time  . albuterol (PROVENTIL HFA;VENTOLIN HFA) 108 (90 Base) MCG/ACT inhaler Inhale 1-2 puffs into the lungs every 4 (four) hours as needed for wheezing or shortness of breath. 1 Inhaler 3 Past Week at Unknown time  . Butalbital-APAP-Caffeine 50-325-40 MG capsule Take 1-2 capsules by mouth every 6 (six) hours as needed for headache. 30 capsule 3 Past Week at Unknown time  . Prenat-FeFmCb-DSS-FA-DHA w/o A (CITRANATAL HARMONY) 27-1-260 MG CAPS Take 27 mg by mouth daily. 30 capsule 11 05/25/2016 at Unknown time  . terconazole (TERAZOL 7) 0.4 % vaginal cream Place 1 applicator vaginally at bedtime. (Patient not taking: Reported on 04/25/2016) 45 g 0 Not Taking     OBJECTIVE Nursing Evaluation: Ht 5\' 3"  (1.6 m)   Wt 187 lb (84.8 kg)   LMP 08/29/2015 (Approximate)   BMI 33.13 kg/m    Findings:  Irregular mild contractions. No cervical change from 05/23/16.   NST was performed and has been reviewed by Dr. Ardine BjorkeFransesco  (remote access to Epic not available)  NST INTERPRETATION:Category I  Mode: External ASSESSMENT Impression:  1. Pregnancy:  G1P0 at [redacted]w[redacted]d , EDD Estimated Date of Delivery: 06/04/16 2.  reactive  PLAN 1. Reassurance given 2. Discharge home with  precautions to return to L&D or call the office if:  increased leakage or fluid, decreased fetal movement or bleeding from vaginal area, labor precautions reviewed. Comfort measures reviewed. 3. Continue routine prenatal care. Follow up at next scheduled visit.

## 2016-05-30 ENCOUNTER — Encounter: Payer: Self-pay | Admitting: Certified Nurse Midwife

## 2016-05-30 ENCOUNTER — Ambulatory Visit (INDEPENDENT_AMBULATORY_CARE_PROVIDER_SITE_OTHER): Payer: BLUE CROSS/BLUE SHIELD | Admitting: Certified Nurse Midwife

## 2016-05-30 VITALS — BP 128/84 | HR 101 | Wt 189.3 lb

## 2016-05-30 DIAGNOSIS — Z3493 Encounter for supervision of normal pregnancy, unspecified, third trimester: Secondary | ICD-10-CM

## 2016-05-30 LAB — POCT URINALYSIS DIPSTICK
BILIRUBIN UA: NEGATIVE
GLUCOSE UA: NEGATIVE
KETONES UA: NEGATIVE
Leukocytes, UA: NEGATIVE
NITRITE UA: NEGATIVE
PH UA: 5 (ref 5.0–8.0)
Protein, UA: NEGATIVE
RBC UA: NEGATIVE
Spec Grav, UA: 1.005 (ref 1.030–1.035)
Urobilinogen, UA: NEGATIVE (ref ?–2.0)

## 2016-05-30 NOTE — Patient Instructions (Signed)

## 2016-05-30 NOTE — Progress Notes (Signed)
Pt is c/o severe headaches.She would like her membranes stripped.

## 2016-05-30 NOTE — Progress Notes (Signed)
ROB- denies LOF, Vaginal bleeding and occasional braxton hick contractions. Positive fetal movement. Membranes swept per pt. Request. Labor precautions reviewed. ROB on week.   Doreene BurkeAnnie Dannilynn Gallina, CNM

## 2016-06-01 ENCOUNTER — Inpatient Hospital Stay
Admission: EM | Admit: 2016-06-01 | Discharge: 2016-06-01 | Disposition: A | Discharge status: Home / Self Care | Payer: BLUE CROSS/BLUE SHIELD | Attending: Obstetrics and Gynecology | Admitting: Obstetrics and Gynecology

## 2016-06-01 DIAGNOSIS — G510 Bell's palsy: Secondary | ICD-10-CM | POA: Diagnosis not present

## 2016-06-01 DIAGNOSIS — Z3A39 39 weeks gestation of pregnancy: Secondary | ICD-10-CM | POA: Insufficient documentation

## 2016-06-01 DIAGNOSIS — R51 Headache: Secondary | ICD-10-CM | POA: Diagnosis present

## 2016-06-01 DIAGNOSIS — O99353 Diseases of the nervous system complicating pregnancy, third trimester: Secondary | ICD-10-CM | POA: Insufficient documentation

## 2016-06-01 LAB — URINALYSIS, ROUTINE W REFLEX MICROSCOPIC
BILIRUBIN URINE: NEGATIVE
Bacteria, UA: NONE SEEN
GLUCOSE, UA: NEGATIVE mg/dL
HGB URINE DIPSTICK: NEGATIVE
Ketones, ur: NEGATIVE mg/dL
NITRITE: NEGATIVE
PROTEIN: NEGATIVE mg/dL
Specific Gravity, Urine: 1.01 (ref 1.005–1.030)
pH: 6 (ref 5.0–8.0)

## 2016-06-01 MED ORDER — PREDNISONE 50 MG PO TABS
60.0000 mg | ORAL_TABLET | Freq: Every day | ORAL | Status: DC
  Administered 2016-06-01: 60 mg via ORAL
  Filled 2016-06-01: qty 1

## 2016-06-01 MED ORDER — ACETAMINOPHEN-CODEINE #3 300-30 MG PO TABS
1.0000 | ORAL_TABLET | Freq: Once | ORAL | Status: AC
  Administered 2016-06-01: 1 via ORAL
  Filled 2016-06-01: qty 1

## 2016-06-01 MED ORDER — PREDNISONE 20 MG PO TABS: 60.0000 mg | ORAL_TABLET | Freq: Every day | ORAL | 0 refills | Status: DC

## 2016-06-01 NOTE — Progress Notes (Signed)
   L&D OB Triage Note  SUBJECTIVE [name] [name] is a 21 y.o. G1P0 female at 4926w4d, EDD Estimated Date of Delivery: 06/04/16 who presented to triage with complaints of headache that is not going away with fioricet and left sided facial pulling and watering of her right eye. 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 Denies LOF, Vaginal bleeding, contractions. Endorses good fetal movement.   Obstetric History   G1   P0   T0   P0   A0   L0    SAB0   TAB0   Ectopic0   Multiple0   Live Births0     # Outcome Date GA Lbr Len/2nd Weight Sex Delivery Anes PTL Lv  1 Current             Obstetric Comments  LMP 08/29/2015 EDD 03/24/02018     Prescriptions Prior to Admission  Medication Sig Dispense Refill Last Dose  . acetaminophen (TYLENOL) 325 MG tablet Take 2 tablets (650 mg total) by mouth every 4 (four) hours as needed. 30 tablet 0 Past Week at Unknown time  . albuterol (PROVENTIL HFA;VENTOLIN HFA) 108 (90 Base) MCG/ACT inhaler Inhale 1-2 puffs into the lungs every 4 (four) hours as needed for wheezing or shortness of breath. 1 Inhaler 3 Past Week at Unknown time  . Butalbital-APAP-Caffeine 50-325-40 MG capsule Take 1-2 capsules by mouth every 6 (six) hours as needed for headache. 30 capsule 3 06/01/2016 at 0600  . Prenat-FeFmCb-DSS-FA-DHA w/o A (CITRANATAL HARMONY) 27-1-260 MG CAPS Take 27 mg by mouth daily. 30 capsule 11 06/01/2016 at 1100     OBJECTIVE  Nursing Evaluation:   BP 119/78   Pulse (!) 106   Temp 98.2 F (36.8 C) (Oral)   Resp 16   LMP 08/29/2015 (Approximate)    Findings: Facial exam reveals right eye brow that is lower than the left and drooping of the right corner of her mouth with   smiling.    Abdomen is soft , no contractions , positive fetal movement.    Urine negative  NST was performed and has been reviewed by me.  NST INTERPRETATION: Category I  Mode: External Baseline Rate (A): 145 bpm Variability: Moderate Accelerations: 15 x  15 Decelerations: None     Contraction Frequency (min): x2 during NST  ASSESSMENT Impression:  1. Pregnancy:  G1P0 at 5826w4d , EDD Estimated Date of Delivery: 06/04/16 2.  Reactive NST  3. Bells Palsy   PLAN 1. Reassurance given 2. Discharge home with precautions to return to L&D or call the office if:  decreased fetal movement, bleeding from vaginal area or labor contractions. leaking or rupture of membranes.  3. Continue routine prenatal care. Follow up in office tomorrow for referral to ENT.  4.Prescription for prednisone 60 mg, PO, daily.  5. Consulted with Dr. Greggory Keenefrancesco regarding plan of care  Doreene BurkeAnnie Traylon Schimming, CNM

## 2016-06-01 NOTE — Discharge Instructions (Signed)
Bell Palsy Bell palsy is a condition in which the muscles on one side of the face become paralyzed. This often causes one side of the face to droop. It is a common condition and most people recover completely. RISK FACTORS Risk factors for Bell palsy include:  Pregnancy.  Diabetes.  An infection by a virus, such as infections that cause cold sores. CAUSES  Bell palsy is caused by damage to or inflammation of a nerve in your face. It is unclear why this happens, but an infection by a virus may lead to it. Most of the time the reason it happens is unknown. SIGNS AND SYMPTOMS  Symptoms can range from mild to severe and can take place over a number of hours. Symptoms may include:  Being unable to:  Raise one or both eyebrows.  Close one or both eyes.  Feel parts of your face (facial numbness).  Drooping of the eyelid and corner of the mouth.  Weakness in the face.  Paralysis of half your face.  Loss of taste.  Sensitivity to loud noises.  Difficulty chewing.  Tearing up of the affected eye.  Dryness in the affected eye.  Drooling.  Pain behind one ear. DIAGNOSIS  Diagnosis of Bell palsy may include:  A medical history and physical exam.  An MRI.  A CT scan.  Electromyography (EMG). This is a test that checks how your nerves are working. TREATMENT  Treatment may include antiviral medicine to help shorten the length of the condition. Sometimes treatment is not needed and the symptoms go away on their own. HOME CARE INSTRUCTIONS   Take medicines only as directed by your health care provider.  Do facial massages and exercises as directed by your health care provider.  If your eye is affected:  Use moisturizing eye drops to prevent drying of your eye as directed by your health care provider.  Protect your eye as directed by your health care provider. SEEK MEDICAL CARE IF:  Your symptoms do not get better or get worse.  You are drooling.  Your eye is red,  irritated, or hurts. SEEK IMMEDIATE MEDICAL CARE IF:   Another part of your body feels weak or numb.  You have difficulty swallowing.  You have a fever along with symptoms of Bell palsy.  You develop neck pain. MAKE SURE YOU:   Understand these instructions.  Will watch your condition.  Will get help right away if you are not doing well or get worse. This information is not intended to replace advice given to you by your health care provider. Make sure you discuss any questions you have with your health care provider. Document Released: 03/03/2005 Document Revised: 11/22/2014 Document Reviewed: 06/10/2013 Elsevier Interactive Patient Education  2017 Elsevier Inc.  

## 2016-06-01 NOTE — OB Triage Note (Addendum)
10 years ago was diagnosed  with bells palsy which has been bothering her today pulling on left side and eye watering on right side. She states that she has a occipital headache which extends down back of neck  and blurry vision for the past week unrelieved by fioricet Baby has also not been moving like normal today since early this morning.

## 2016-06-02 ENCOUNTER — Ambulatory Visit (INDEPENDENT_AMBULATORY_CARE_PROVIDER_SITE_OTHER): Payer: BLUE CROSS/BLUE SHIELD | Admitting: Certified Nurse Midwife

## 2016-06-02 VITALS — BP 128/72 | HR 86 | Wt 190.8 lb

## 2016-06-02 DIAGNOSIS — Z3493 Encounter for supervision of normal pregnancy, unspecified, third trimester: Secondary | ICD-10-CM

## 2016-06-02 LAB — POCT URINALYSIS DIPSTICK
BILIRUBIN UA: NEGATIVE
Blood, UA: NEGATIVE
Glucose, UA: NEGATIVE
Ketones, UA: NEGATIVE
LEUKOCYTES UA: NEGATIVE
NITRITE UA: NEGATIVE
PH UA: 6 (ref 5.0–8.0)
PROTEIN UA: NEGATIVE
Spec Grav, UA: 1.01 (ref 1.030–1.035)
Urobilinogen, UA: 0.2 (ref ?–2.0)

## 2016-06-02 MED ORDER — ACETAMINOPHEN-CODEINE #3 300-30 MG PO TABS: 1.0000 | ORAL_TABLET | Freq: Four times a day (QID) | ORAL | 0 refills | Status: DC | PRN

## 2016-06-02 NOTE — Progress Notes (Signed)
Pt presents today for follow up. Was seen in the hospital 06/01/16 for Bells Palsy on her right side.  Referral to neurologist. She was given a tylenol # 3 in the hospital for Migraine headache that was not relieved by the Butalbital. She states that it did help. She was given a prescription for 4 additional tablets with instructions to use only if migraine is severe. She endorse positive fetal movement, irregular mild contractions. No LOF or vaginal bleeding. Membranes swept per pts request. Instructions on patching her eye and use of saline drops if Bells Palsy progresses . She will return on Friday as scheduled.   Doreene BurkeAnnie Meleane Selinger, CNM

## 2016-06-02 NOTE — Patient Instructions (Signed)
bBell Palsy Bell palsy is a condition in which the muscles on one side of the face become paralyzed. This often causes one side of the face to droop. It is a common condition and most people recover completely. RISK FACTORS Risk factors for Bell palsy include:  Pregnancy.  Diabetes.  An infection by a virus, such as infections that cause cold sores. CAUSES  Bell palsy is caused by damage to or inflammation of a nerve in your face. It is unclear why this happens, but an infection by a virus may lead to it. Most of the time the reason it happens is unknown. SIGNS AND SYMPTOMS  Symptoms can range from mild to severe and can take place over a number of hours. Symptoms may include:  Being unable to:  Raise one or both eyebrows.  Close one or both eyes.  Feel parts of your face (facial numbness).  Drooping of the eyelid and corner of the mouth.  Weakness in the face.  Paralysis of half your face.  Loss of taste.  Sensitivity to loud noises.  Difficulty chewing.  Tearing up of the affected eye.  Dryness in the affected eye.  Drooling.  Pain behind one ear. DIAGNOSIS  Diagnosis of Bell palsy may include:  A medical history and physical exam.  An MRI.  A CT scan.  Electromyography (EMG). This is a test that checks how your nerves are working. TREATMENT  Treatment may include antiviral medicine to help shorten the length of the condition. Sometimes treatment is not needed and the symptoms go away on their own. HOME CARE INSTRUCTIONS   Take medicines only as directed by your health care provider.  Do facial massages and exercises as directed by your health care provider.  If your eye is affected:  Use moisturizing eye drops to prevent drying of your eye as directed by your health care provider.  Protect your eye as directed by your health care provider. SEEK MEDICAL CARE IF:  Your symptoms do not get better or get worse.  You are drooling.  Your eye is red,  irritated, or hurts. SEEK IMMEDIATE MEDICAL CARE IF:   Another part of your body feels weak or numb.  You have difficulty swallowing.  You have a fever along with symptoms of Bell palsy.  You develop neck pain. MAKE SURE YOU:   Understand these instructions.  Will watch your condition.  Will get help right away if you are not doing well or get worse. This information is not intended to replace advice given to you by your health care provider. Make sure you discuss any questions you have with your health care provider. Document Released: 03/03/2005 Document Revised: 11/22/2014 Document Reviewed: 06/10/2013 Elsevier Interactive Patient Education  2017 [employer]

## 2016-06-02 NOTE — Progress Notes (Signed)
OB WORK IN- pt was seen in the ER 06/01/16, dx with Bell's Palsy, pt just isnt feeling well at all

## 2016-06-04 ENCOUNTER — Observation Stay
Admission: EM | Admit: 2016-06-04 | Discharge: 2016-06-04 | Disposition: A | Discharge status: Home / Self Care | Payer: BLUE CROSS/BLUE SHIELD | Source: Home / Self Care | Admitting: Obstetrics and Gynecology

## 2016-06-04 ENCOUNTER — Encounter: Payer: Self-pay | Admitting: *Deleted

## 2016-06-04 DIAGNOSIS — O480 Post-term pregnancy: Secondary | ICD-10-CM | POA: Insufficient documentation

## 2016-06-04 DIAGNOSIS — Z7952 Long term (current) use of systemic steroids: Secondary | ICD-10-CM

## 2016-06-04 DIAGNOSIS — O99824 Streptococcus B carrier state complicating childbirth: Secondary | ICD-10-CM | POA: Diagnosis not present

## 2016-06-04 DIAGNOSIS — O4292 Full-term premature rupture of membranes, unspecified as to length of time between rupture and onset of labor: Secondary | ICD-10-CM | POA: Diagnosis not present

## 2016-06-04 DIAGNOSIS — Z412 Encounter for routine and ritual male circumcision: Secondary | ICD-10-CM | POA: Diagnosis not present

## 2016-06-04 DIAGNOSIS — Z23 Encounter for immunization: Secondary | ICD-10-CM | POA: Diagnosis not present

## 2016-06-04 DIAGNOSIS — Z7951 Long term (current) use of inhaled steroids: Secondary | ICD-10-CM | POA: Insufficient documentation

## 2016-06-04 DIAGNOSIS — D649 Anemia, unspecified: Secondary | ICD-10-CM | POA: Diagnosis not present

## 2016-06-04 DIAGNOSIS — O99354 Diseases of the nervous system complicating childbirth: Secondary | ICD-10-CM | POA: Diagnosis not present

## 2016-06-04 DIAGNOSIS — O471 False labor at or after 37 completed weeks of gestation: Secondary | ICD-10-CM | POA: Diagnosis not present

## 2016-06-04 DIAGNOSIS — Z3A40 40 weeks gestation of pregnancy: Secondary | ICD-10-CM

## 2016-06-04 DIAGNOSIS — O9081 Anemia of the puerperium: Secondary | ICD-10-CM | POA: Diagnosis not present

## 2016-06-04 DIAGNOSIS — Z3493 Encounter for supervision of normal pregnancy, unspecified, third trimester: Secondary | ICD-10-CM | POA: Diagnosis not present

## 2016-06-04 DIAGNOSIS — G510 Bell's palsy: Secondary | ICD-10-CM | POA: Diagnosis not present

## 2016-06-04 NOTE — Discharge Instructions (Signed)
Come back if:  Big gush of fluids Temp over 100.4 Decreased fetal movement Heavy vaginal bleeding Contractions every 3-5 min lasting at least one hour  Get plenty of rest and stay well hydrated!

## 2016-06-04 NOTE — [facility] Provider Note (Signed)
L&D OB Triage Note  Subjective  [name] [name] is a 21 y.o. G1P0 female at 2086w0d, EDD Estimated Date of Delivery: 06/04/16 who presented to triage with complaints of contractions since 0800, now three minutes apart.    Pt is walking, talking, and texting during contractions.   Objective:   Temp:  [98.4 F (36.9 C)] 98.4 F (36.9 C) (03/21 1812) Pulse Rate:  [94] 94 (03/21 1812) Resp:  [16] 16 (03/21 1812) BP: (138)/(64) 138/64 (03/21 1812) Weight:  [190 lb 12.8 oz (86.5 kg)] 190 lb 12.8 oz (86.5 kg) (03/21 1822)  NST INTERPRETATION:  Mode: External Baseline Rate (A): 155 bpm Variability: Moderate Accelerations: 15 x 15 Decelerations: Variable  Contraction Frequency (min): 3-6  SVE: 1.5/50/-3; recheck 2/50-60/-2, vtx  Assessment:  Term pregnancy Braxton Hicks Contractions Not in labor (False Labor) Reactive NST  Plan:   Discharge home with labor precautions and kick counts Encourage home measures including oral hydration, tub, and Benadryl as needed Continue prenatal care, f/u with office Friday as previously scheduled.    Serafina RoyalsMichelle Jaylon Boylen, CNM

## 2016-06-04 NOTE — OB Triage Note (Signed)
Ctx since 0800 this am. Pt. Reports ctx coming every 3 minutes. Denies any bleeding or LOF. Reports good fetal movement. [name] [name], Takeru Bose S

## 2016-06-05 ENCOUNTER — Inpatient Hospital Stay
Admission: EM | Admit: 2016-06-05 | Discharge: 2016-06-08 | DRG: 775 | Disposition: A | Discharge status: Home / Self Care | Payer: BLUE CROSS/BLUE SHIELD | Attending: Obstetrics and Gynecology | Admitting: Obstetrics and Gynecology

## 2016-06-05 DIAGNOSIS — O4292 Full-term premature rupture of membranes, unspecified as to length of time between rupture and onset of labor: Secondary | ICD-10-CM | POA: Diagnosis present

## 2016-06-05 DIAGNOSIS — Z3490 Encounter for supervision of normal pregnancy, unspecified, unspecified trimester: Secondary | ICD-10-CM

## 2016-06-05 DIAGNOSIS — O9081 Anemia of the puerperium: Secondary | ICD-10-CM | POA: Diagnosis present

## 2016-06-05 DIAGNOSIS — O99824 Streptococcus B carrier state complicating childbirth: Secondary | ICD-10-CM | POA: Diagnosis present

## 2016-06-05 DIAGNOSIS — D649 Anemia, unspecified: Secondary | ICD-10-CM | POA: Diagnosis present

## 2016-06-05 DIAGNOSIS — Z3A40 40 weeks gestation of pregnancy: Secondary | ICD-10-CM

## 2016-06-05 DIAGNOSIS — G510 Bell's palsy: Secondary | ICD-10-CM | POA: Diagnosis present

## 2016-06-05 DIAGNOSIS — O99354 Diseases of the nervous system complicating childbirth: Secondary | ICD-10-CM | POA: Diagnosis present

## 2016-06-05 NOTE — OB Triage Note (Signed)
Patient came in c/o ctrx since yesterday when she left the hospital after being evaluated. As the day progressed patient reports ctrx are closer in stronger. Since 2100 ctrxs per patient are every 2-3 mins rating pain 9 out of 10. Reports scant bleeding when wiping and scant discharge. Reports positive fetal movement.

## 2016-06-05 NOTE — Discharge Summary (Signed)
    OB Discharge Summary     Patient Name: [name] [name] DOB: 12/24/1995 MRN: [id number]  Date of admission: 06/04/2016  Date of discharge: 06/04/2016  Admitting diagnosis: 40 wks preg contractions      Discharge diagnosis: Braxton Hicks Contractions, False Labor                                                                                                Hospital course:  [facility] of [facility]. NST found to be reactive. Pt encouraged to ambulate for one hour. No significant change in cervical dilation and no increase in frequency, duration, or strength of contractions. Pt send home with labor precautions and kick counts. Advised to keep previously scheduled office appointment.   Physical exam  Vitals:   06/04/16 1812 06/04/16 1822  BP: 138/64   Pulse: 94   Resp: 16   Temp: 98.4 F (36.9 C)   TempSrc: Oral   Weight:  190 lb 12.8 oz (86.5 kg)  Height:  5\' 3"  (1.6 m)   General: alert, cooperative and no distress  Discharge instruction: per After Visit Summary   After visit meds:  Allergies as of 06/04/2016      Reactions   Ibuprofen Nausea Only      Medication List    TAKE these medications   acetaminophen 325 MG tablet Commonly known as:  TYLENOL Take 2 tablets (650 mg total) by mouth every 4 (four) hours as needed.   acetaminophen-codeine 300-30 MG tablet Commonly known as:  TYLENOL #3 Take 1 tablet by mouth every 6 (six) hours as needed for moderate pain or severe pain.   albuterol 108 (90 Base) MCG/ACT inhaler Commonly known as:  PROVENTIL HFA;VENTOLIN HFA Inhale 1-2 puffs into the lungs every 4 (four) hours as needed for wheezing or shortness of breath.   Butalbital-APAP-Caffeine 50-325-40 MG capsule Take 1-2 capsules by mouth every 6 (six) hours as needed for headache.   CITRANATAL HARMONY 27-1-260 MG Caps Take 27 mg by mouth daily.   predniSONE 20 MG tablet Commonly known as:  DELTASONE Take 3 tablets (60 mg total) by mouth  daily with breakfast.      Diet: routine diet  Activity: Advance as tolerated.   Follow up Appt:Future Appointments Date Time Provider Department Center  06/06/2016 2:45 PM Doreene Burkennie Thompson, CNM [facility] None    06/05/2016 Serafina RoyalsMichelle Shuntell Foody, CNM

## 2016-06-06 ENCOUNTER — Encounter: Payer: BLUE CROSS/BLUE SHIELD | Admitting: Certified Nurse Midwife

## 2016-06-06 ENCOUNTER — Inpatient Hospital Stay: Payer: BLUE CROSS/BLUE SHIELD | Admitting: Anesthesiology

## 2016-06-06 DIAGNOSIS — D649 Anemia, unspecified: Secondary | ICD-10-CM | POA: Diagnosis present

## 2016-06-06 DIAGNOSIS — O9081 Anemia of the puerperium: Secondary | ICD-10-CM | POA: Diagnosis present

## 2016-06-06 DIAGNOSIS — O99354 Diseases of the nervous system complicating childbirth: Secondary | ICD-10-CM | POA: Diagnosis present

## 2016-06-06 DIAGNOSIS — Z412 Encounter for routine and ritual male circumcision: Secondary | ICD-10-CM | POA: Diagnosis not present

## 2016-06-06 DIAGNOSIS — Z3A40 40 weeks gestation of pregnancy: Secondary | ICD-10-CM | POA: Diagnosis not present

## 2016-06-06 DIAGNOSIS — Z3493 Encounter for supervision of normal pregnancy, unspecified, third trimester: Secondary | ICD-10-CM | POA: Diagnosis present

## 2016-06-06 DIAGNOSIS — O99824 Streptococcus B carrier state complicating childbirth: Secondary | ICD-10-CM | POA: Diagnosis present

## 2016-06-06 DIAGNOSIS — G510 Bell's palsy: Secondary | ICD-10-CM | POA: Diagnosis present

## 2016-06-06 DIAGNOSIS — O4292 Full-term premature rupture of membranes, unspecified as to length of time between rupture and onset of labor: Secondary | ICD-10-CM | POA: Diagnosis present

## 2016-06-06 LAB — CBC
HCT: 27.6 % — ABNORMAL LOW (ref 35.0–47.0)
HCT: 30 % — ABNORMAL LOW (ref 35.0–47.0)
Hemoglobin: 8.8 g/dL — ABNORMAL LOW (ref 12.0–16.0)
Hemoglobin: 9.8 g/dL — ABNORMAL LOW (ref 12.0–16.0)
MCH: 21.1 pg — ABNORMAL LOW (ref 26.0–34.0)
MCH: 21.5 pg — AB (ref 26.0–34.0)
MCHC: 32.1 g/dL (ref 32.0–36.0)
MCHC: 32.8 g/dL (ref 32.0–36.0)
MCV: 65.7 fL — AB (ref 80.0–100.0)
MCV: 65.7 fL — ABNORMAL LOW (ref 80.0–100.0)
PLATELETS: 228 10*3/uL (ref 150–440)
PLATELETS: 247 10*3/uL (ref 150–440)
RBC: 4.19 MIL/uL (ref 3.80–5.20)
RBC: 4.57 MIL/uL (ref 3.80–5.20)
RDW: 18.8 % — AB (ref 11.5–14.5)
RDW: 19.1 % — ABNORMAL HIGH (ref 11.5–14.5)
WBC: 18.7 10*3/uL — ABNORMAL HIGH (ref 3.6–11.0)
WBC: 19.2 10*3/uL — ABNORMAL HIGH (ref 3.6–11.0)

## 2016-06-06 LAB — CHLAMYDIA/NGC RT PCR (ARMC ONLY)
Chlamydia Tr: NOT DETECTED
N gonorrhoeae: NOT DETECTED

## 2016-06-06 LAB — TYPE AND SCREEN
ABO/RH(D): O POS
Antibody Screen: NEGATIVE

## 2016-06-06 MED ORDER — SOD CITRATE-CITRIC ACID 500-334 MG/5ML PO SOLN: 30.0000 mL | ORAL | Status: DC | PRN

## 2016-06-06 MED ORDER — DIBUCAINE 1 % RE OINT: 1.0000 "application " | TOPICAL_OINTMENT | RECTAL | Status: DC | PRN

## 2016-06-06 MED ORDER — PENICILLIN G POT IN DEXTROSE 60000 UNIT/ML IV SOLN
3.0000 10*6.[IU] | INTRAVENOUS | Status: DC
Start: 2016-06-06 — End: 2016-06-06
  Filled 2016-06-06 (×3): qty 50

## 2016-06-06 MED ORDER — ACETAMINOPHEN 325 MG PO TABS: 650.0000 mg | ORAL_TABLET | ORAL | Status: DC | PRN

## 2016-06-06 MED ORDER — MISOPROSTOL 200 MCG PO TABS
ORAL_TABLET | ORAL | Status: AC
  Filled 2016-06-06: qty 4

## 2016-06-06 MED ORDER — WITCH HAZEL-GLYCERIN EX PADS: 1.0000 "application " | MEDICATED_PAD | CUTANEOUS | Status: DC | PRN

## 2016-06-06 MED ORDER — ALBUTEROL SULFATE (2.5 MG/3ML) 0.083% IN NEBU: 3.0000 mL | INHALATION_SOLUTION | RESPIRATORY_TRACT | Status: DC | PRN

## 2016-06-06 MED ORDER — AMMONIA AROMATIC IN INHA
RESPIRATORY_TRACT | Status: AC
  Filled 2016-06-06: qty 10

## 2016-06-06 MED ORDER — LIDOCAINE HCL (PF) 1 % IJ SOLN
INTRAMUSCULAR | Status: DC | PRN
  Administered 2016-06-06: 1 mL via INTRADERMAL

## 2016-06-06 MED ORDER — DEXTROSE 5 % IV SOLN
5.0000 10*6.[IU] | Freq: Once | INTRAVENOUS | Status: AC
  Administered 2016-06-06: 5 10*6.[IU] via INTRAVENOUS
  Filled 2016-06-06: qty 5

## 2016-06-06 MED ORDER — LACTATED RINGERS IV SOLN: 500.0000 mL | INTRAVENOUS | Status: DC | PRN

## 2016-06-06 MED ORDER — PREDNISONE 20 MG PO TABS
60.0000 mg | ORAL_TABLET | Freq: Every day | ORAL | Status: DC
Start: 2016-06-06 — End: 2016-06-08
  Administered 2016-06-06 – 2016-06-08 (×3): 60 mg via ORAL
  Filled 2016-06-06 (×3): qty 3

## 2016-06-06 MED ORDER — ONDANSETRON HCL 4 MG/2ML IJ SOLN: 4.0000 mg | Freq: Four times a day (QID) | INTRAMUSCULAR | Status: DC | PRN

## 2016-06-06 MED ORDER — OXYCODONE-ACETAMINOPHEN 5-325 MG PO TABS: 1.0000 | ORAL_TABLET | ORAL | Status: DC | PRN

## 2016-06-06 MED ORDER — LIDOCAINE HCL (PF) 1 % IJ SOLN: 30.0000 mL | INTRAMUSCULAR | Status: DC | PRN

## 2016-06-06 MED ORDER — OXYTOCIN 10 UNIT/ML IJ SOLN
INTRAMUSCULAR | Status: AC
  Filled 2016-06-06: qty 2

## 2016-06-06 MED ORDER — SENNOSIDES-DOCUSATE SODIUM 8.6-50 MG PO TABS: 2.0000 | ORAL_TABLET | ORAL | Status: DC

## 2016-06-06 MED ORDER — OXYTOCIN 40 UNITS IN LACTATED RINGERS INFUSION - SIMPLE MED: 2.5000 [IU]/h | INTRAVENOUS | Status: DC

## 2016-06-06 MED ORDER — OXYCODONE-ACETAMINOPHEN 5-325 MG PO TABS: 2.0000 | ORAL_TABLET | ORAL | Status: DC | PRN

## 2016-06-06 MED ORDER — FENTANYL 2.5 MCG/ML W/ROPIVACAINE 0.2% IN NS 100 ML EPIDURAL INFUSION (ARMC-ANES)
EPIDURAL | Status: DC | PRN
  Administered 2016-06-06: 10 mL/h via EPIDURAL

## 2016-06-06 MED ORDER — DIPHENHYDRAMINE HCL 25 MG PO CAPS: 25.0000 mg | ORAL_CAPSULE | Freq: Four times a day (QID) | ORAL | Status: DC | PRN

## 2016-06-06 MED ORDER — ACETAMINOPHEN 325 MG PO TABS
650.0000 mg | ORAL_TABLET | ORAL | Status: DC | PRN
  Administered 2016-06-08: 650 mg via ORAL
  Filled 2016-06-06: qty 2

## 2016-06-06 MED ORDER — PRENATAL MULTIVITAMIN CH
1.0000 | ORAL_TABLET | Freq: Every day | ORAL | Status: DC
  Administered 2016-06-06 – 2016-06-08 (×3): 1 via ORAL
  Filled 2016-06-06 (×3): qty 1

## 2016-06-06 MED ORDER — LACTATED RINGERS IV SOLN
INTRAVENOUS | Status: DC
  Administered 2016-06-06: 01:00:00 via INTRAVENOUS

## 2016-06-06 MED ORDER — BENZOCAINE-MENTHOL 20-0.5 % EX AERO: 1.0000 "application " | INHALATION_SPRAY | CUTANEOUS | Status: DC | PRN

## 2016-06-06 MED ORDER — MINERAL OIL PO OIL
TOPICAL_OIL | Freq: Once | ORAL | Status: DC
  Filled 2016-06-06 (×2): qty 30

## 2016-06-06 MED ORDER — OXYCODONE-ACETAMINOPHEN 5-325 MG PO TABS
2.0000 | ORAL_TABLET | ORAL | Status: DC | PRN
  Administered 2016-06-06: 2 via ORAL
  Filled 2016-06-06 (×4): qty 2

## 2016-06-06 MED ORDER — ZOLPIDEM TARTRATE 5 MG PO TABS: 5.0000 mg | ORAL_TABLET | Freq: Every evening | ORAL | Status: DC | PRN

## 2016-06-06 MED ORDER — ONDANSETRON HCL 4 MG PO TABS
4.0000 mg | ORAL_TABLET | ORAL | Status: DC | PRN
Start: 2016-06-06 — End: 2016-06-08

## 2016-06-06 MED ORDER — LIDOCAINE-EPINEPHRINE (PF) 1.5 %-1:200000 IJ SOLN
INTRAMUSCULAR | Status: DC | PRN
  Administered 2016-06-06: 3 mL via EPIDURAL

## 2016-06-06 MED ORDER — OXYTOCIN BOLUS FROM INFUSION
500.0000 mL | Freq: Once | INTRAVENOUS | Status: AC
Start: 2016-06-06 — End: 2016-06-06
  Administered 2016-06-06: 500 mL via INTRAVENOUS

## 2016-06-06 MED ORDER — SODIUM CHLORIDE 0.9 % IV SOLN
INTRAVENOUS | Status: DC | PRN
  Administered 2016-06-06 (×2): 5 mL via EPIDURAL

## 2016-06-06 MED ORDER — SENNOSIDES-DOCUSATE SODIUM 8.6-50 MG PO TABS
2.0000 | ORAL_TABLET | ORAL | Status: DC
  Administered 2016-06-07 – 2016-06-08 (×2): 2 via ORAL
  Filled 2016-06-06 (×2): qty 2

## 2016-06-06 MED ORDER — TETANUS-DIPHTH-ACELL PERTUSSIS 5-2.5-18.5 LF-MCG/0.5 IM SUSP: 0.5000 mL | Freq: Once | INTRAMUSCULAR | Status: DC

## 2016-06-06 MED ORDER — OXYTOCIN 40 UNITS IN LACTATED RINGERS INFUSION - SIMPLE MED
INTRAVENOUS | Status: AC
  Administered 2016-06-06: 500 mL via INTRAVENOUS
  Filled 2016-06-06: qty 1000

## 2016-06-06 MED ORDER — FENTANYL 2.5 MCG/ML W/ROPIVACAINE 0.2% IN NS 100 ML EPIDURAL INFUSION (ARMC-ANES)
EPIDURAL | Status: AC
  Filled 2016-06-06: qty 100

## 2016-06-06 MED ORDER — ONDANSETRON HCL 4 MG/2ML IJ SOLN: 4.0000 mg | INTRAMUSCULAR | Status: DC | PRN

## 2016-06-06 MED ORDER — LIDOCAINE HCL (PF) 1 % IJ SOLN
INTRAMUSCULAR | Status: AC
  Filled 2016-06-06: qty 30

## 2016-06-06 MED ORDER — COCONUT OIL OIL
1.0000 "application " | TOPICAL_OIL | Status: DC | PRN
  Filled 2016-06-06: qty 120

## 2016-06-06 MED ORDER — OXYCODONE-ACETAMINOPHEN 5-325 MG PO TABS
1.0000 | ORAL_TABLET | ORAL | Status: DC | PRN
  Administered 2016-06-06 – 2016-06-07 (×5): 1 via ORAL
  Filled 2016-06-06 (×2): qty 1

## 2016-06-06 MED ORDER — BUTORPHANOL TARTRATE 1 MG/ML IJ SOLN
1.0000 mg | INTRAMUSCULAR | Status: DC | PRN
  Administered 2016-06-06: 1 mg via INTRAVENOUS
  Filled 2016-06-06: qty 1

## 2016-06-06 MED ORDER — SIMETHICONE 80 MG PO CHEW: 80.0000 mg | CHEWABLE_TABLET | ORAL | Status: DC | PRN

## 2016-06-06 NOTE — Progress Notes (Signed)
[name] [name] is a 21 y.o. G1P0 at 3628w2d by ultrasound admitted for active labor  Subjective:  Pt in bed reporting intermittent pelvic pressure with the urge to push.   Denies difficulty breathing or respiratory distress, chest pain, and leg pain or swelling.   Objective:  BP 131/79 (BP Location: Left Arm)   Pulse 90   Temp 98.2 F (36.8 C) (Oral)   Resp 19   Ht 5\' 3"  (1.6 m)   Wt 190 lb (86.2 kg)   LMP 08/29/2015 (Approximate)   BMI 33.66 kg/m    FHT:  FHR: 135 bpm, variability: moderate,  accelerations:  Present,  decelerations:  Present early, late UC:   regular, every one (1) to (2) minutes SVE:   Dilation: 10 Effacement (%): 100 Station: +1 Exam by:: ALogan Bores. Evans, RN  Labs: Lab Results  Component Value Date   WBC 19.2 (H) 06/06/2016   HGB 9.8 (L) 06/06/2016   HCT 30.0 (L) 06/06/2016   MCV 65.7 (L) 06/06/2016   PLT 247 06/06/2016    Assessment:  Spontaneous labor, progressing normally  GBS positive Epidural Bells palsy  Plan:  Continue orders as written Room prepared for second stage of labor Anticipate vaginal birth Receiving nurse and NNP notified of impending birth Dr Valentino Saxonherry notified and on her way   Gunnar BullaJenkins Michelle Ramces Shomaker, CNM 06/06/2016, 3:10 AM

## 2016-06-06 NOTE — H&P (Signed)
Obstetric History and Physical  [name] [name] is a 21 y.o. G1P0 with IUP at 7226w2d presenting with contractions. Patient states she has been having  regular, contractions every two (2) to three (3) minutes, none vaginal bleeding, intact membranes, with active fetal movement.    [facility] total [facility].  Pregnancy complications or risks: GBS positive, Bells Palsy  Prenatal labs and studies: ABO, Rh: --/--/O POS (03/23 0041) Antibody: NEG (03/23 0041) Rubella: 3.67 (08/29 1003) RPR: Non Reactive (08/29 1003)  HBsAg: Negative (08/29 1003)  HIV: Non Reactive (08/29 1003)  WUJ:WJXBJYNWGBS:Positive (02/23 0826) 1 hr Glucola: 111 Genetic screening normal Anatomy US normal  Past Medical History:  Diagnosis Date  . Abdominal pain   . Anal fissure   . Asthma   . Constipation   . H/O chlamydia infection 10/30/2015  . Headache     Past Surgical History:  Procedure Laterality Date  . KNEE SURGERY Left 2014, 2015    OB History  Gravida Para Term Preterm AB Living  1            SAB TAB Ectopic Multiple Live Births               # Outcome Date GA Lbr Len/2nd Weight Sex Delivery Anes PTL Lv  1 Current             Obstetric Comments  LMP 08/29/2015 EDD 03/24/02018     Social History   Social History  . Marital status: Single    Spouse name: N/A  . Number of children: N/A  . Years of education: N/A   Social History Main Topics  . Smoking status: Never Smoker  . Smokeless tobacco: Never Used  . Alcohol use No  . Drug use: No  . Sexual activity: Yes    Partners: Male    Birth control/ protection: Condom   Other Topics Concern  . None   Social History Narrative   11th grade 2014-2015    Family History  Problem Relation Age of Onset  . Cancer Sister   . Seizures Sister   . Migraines Mother   . Diabetes Paternal Grandmother   . Celiac disease Neg Hx   . Cholelithiasis Neg Hx   . Ulcers Neg Hx     Prescriptions Prior to Admission   Medication Sig Dispense Refill Last Dose  . albuterol (PROVENTIL HFA;VENTOLIN HFA) 108 (90 Base) MCG/ACT inhaler Inhale 1-2 puffs into the lungs every 4 (four) hours as needed for wheezing or shortness of breath. 1 Inhaler 3 Past Week at Unknown time  . predniSONE (DELTASONE) 20 MG tablet Take 3 tablets (60 mg total) by mouth daily with breakfast. 18 tablet 0 06/05/2016 at Unknown time  . Prenat-FeFmCb-DSS-FA-DHA w/o A (CITRANATAL HARMONY) 27-1-260 MG CAPS Take 27 mg by mouth daily. 30 capsule 11 06/05/2016 at Unknown time  . acetaminophen (TYLENOL) 325 MG tablet Take 2 tablets (650 mg total) by mouth every 4 (four) hours as needed. (Patient not taking: Reported on 06/04/2016) 30 tablet 0 Not Taking at Unknown time  . acetaminophen-codeine (TYLENOL #3) 300-30 MG tablet Take 1 tablet by mouth every 6 (six) hours as needed for moderate pain or severe pain. 4 tablet 0 06/03/2016 at Unknown time  . Butalbital-APAP-Caffeine 50-325-40 MG capsule Take 1-2 capsules by mouth every 6 (six) hours as needed for headache. (Patient not taking: Reported on 06/04/2016) 30 capsule 3 Not Taking at Unknown time    Allergies  Allergen Reactions  . Ibuprofen Nausea Only    Review of Systems: Negative except for what is mentioned in HPI.  Physical Exam: BP 131/79 (BP Location: Left Arm)   Pulse 90   Temp 98.2 F (36.8 C) (Oral)   Resp 19   Ht 5\' 3"  (1.6 m)   Wt 190 lb (86.2 kg)   LMP 08/29/2015 (Approximate)   BMI 33.66 kg/m    GENERAL: Well-developed, well-nourished female in no acute distress.   LUNGS: Clear to auscultation bilaterally.   HEART: Regular rate and rhythm.  ABDOMEN: Soft, nontender, nondistended, gravid.  EXTREMITIES: Nontender, no edema, 2+ distal pulses.  Cervical Exam: 7/80/0, vtx by RN. SROM at 0100  FHT:  Baseline rate 130 bpm    Variability moderate   Accelerations present    Decelerations early Contractions: Every one (1) to three (3) mins   Pertinent Labs/Studies:    Results for orders placed or performed during the hospital encounter of 06/05/16 (from the past 24 hour(s))  CBC     Status: Abnormal   Collection Time: 06/06/16 12:41 AM  Result Value Ref Range   WBC 19.2 (H) 3.6 - 11.0 K/uL   RBC 4.57 3.80 - 5.20 MIL/uL   Hemoglobin 9.8 (L) 12.0 - 16.0 g/dL   HCT 16.1 (L) 09.6 - 04.5 %   MCV 65.7 (L) 80.0 - 100.0 fL   MCH 21.5 (L) 26.0 - 34.0 pg   MCHC 32.8 32.0 - 36.0 g/dL   RDW 40.9 (H) 81.1 - 91.4 %   Platelets 247 150 - 440 K/uL  Type and screen [facility]     Status: None   Collection Time: 06/06/16 12:41 AM  Result Value Ref Range   ABO/RH(D) O POS    Antibody Screen NEG    Sample Expiration 06/09/2016     Assessment :  Term labor SROM GBS positive  O positive Epidural FHR Category I Formula feeding  Plan:  Expectant management Labor and Delivery Admission order initiated including GBS protocol Delivery plan: Hopeful for vaginal delivery  Dr. Valentino Saxon notified of admission and plan of care.    [name], CNM [facility], [facility]

## 2016-06-06 NOTE — Anesthesia Procedure Notes (Signed)
Epidural Patient location during procedure: OB Start time: 06/06/2016 2:00 AM End time: 06/06/2016 2:03 AM  Staffing Anesthesiologist: Margorie JohnPISCITELLO, JOSEPH K Performed: anesthesiologist   Preanesthetic Checklist Completed: patient identified, site marked, surgical consent, pre-op evaluation, timeout performed, IV checked, risks and benefits discussed and monitors and equipment checked  Epidural Patient position: sitting Prep: Betadine Patient monitoring: heart rate, continuous pulse ox and blood pressure Approach: midline Location: L3-L4 Injection technique: LOR saline  Needle:  Needle type: Tuohy  Needle gauge: 17 G Needle length: 9 cm and 9 Needle insertion depth: 6 cm Catheter type: closed end flexible Catheter size: 19 Gauge Catheter at skin depth: 10 cm Test dose: negative and 1.5% lidocaine with Epi 1:200 K  Assessment Sensory level: T10 Events: blood not aspirated, injection not painful, no injection resistance, negative IV test and no paresthesia  Additional Notes Pt. Evaluated and documentation done after procedure finished. Patient identified. Risks/Benefits/Options discussed with patient including but not limited to bleeding, infection, nerve damage, paralysis, failed block, incomplete pain control, headache, blood pressure changes, nausea, vomiting, reactions to medication both or allergic, itching and postpartum back pain. Confirmed with bedside nurse the patient's most recent platelet count. Confirmed with patient that they are not currently taking any anticoagulation, have any bleeding history or any family history of bleeding disorders. Patient expressed understanding and wished to proceed. All questions were answered. Sterile technique was used throughout the entire procedure. Please see nursing notes for vital signs. Test dose was given through epidural catheter and negative prior to continuing to dose epidural or start infusion. Warning signs of high block given to  the patient including shortness of breath, tingling/numbness in hands, complete motor block, or any concerning symptoms with instructions to call for help. Patient was given instructions on fall risk and not to get out of bed. All questions and concerns addressed with instructions to call with any issues or inadequate analgesia.   Patient tolerated the insertion well without immediate complications.Reason for block:procedure for pain

## 2016-06-06 NOTE — Anesthesia Preprocedure Evaluation (Signed)
Anesthesia Evaluation  Patient identified by MRN, date of birth, ID band Patient awake    Reviewed: Allergy & Precautions, H&P , NPO status , Patient's Chart, lab work & pertinent test results  History of Anesthesia Complications Negative for: history of anesthetic complications  Airway Mallampati: III  TM Distance: >3 FB Neck ROM: full    Dental  (+) Poor Dentition   Pulmonary asthma ,    Pulmonary exam normal breath sounds clear to auscultation       Cardiovascular Exercise Tolerance: Good (-) hypertensionnegative cardio ROS Normal cardiovascular exam Rhythm:regular Rate:Normal     Neuro/Psych  Headaches,  Neuromuscular disease    GI/Hepatic negative GI ROS,   Endo/Other    Renal/GU   negative genitourinary   Musculoskeletal   Abdominal   Peds  Hematology negative hematology ROS (+)   Anesthesia Other Findings Past Medical History: No date: Abdominal pain No date: Anal fissure No date: Asthma No date: Constipation 10/30/2015: H/O chlamydia infection No date: Headache  Past Surgical History: 2014, 2015: KNEE SURGERY Left  BMI    Body Mass Index:  33.66 kg/m      Reproductive/Obstetrics (+) Pregnancy                             Anesthesia Physical Anesthesia Plan  ASA: III  Anesthesia Plan: Epidural   Post-op Pain Management:    Induction:   Airway Management Planned:   Additional Equipment:   Intra-op Plan:   Post-operative Plan:   Informed Consent: I have reviewed the patients History and Physical, chart, labs and discussed the procedure including the risks, benefits and alternatives for the proposed anesthesia with the patient or authorized representative who has indicated his/her understanding and acceptance.     Plan Discussed with: Anesthesiologist  Anesthesia Plan Comments:         Anesthesia Quick Evaluation

## 2016-06-07 LAB — RPR: RPR: NONREACTIVE

## 2016-06-07 MED ORDER — VALACYCLOVIR HCL 500 MG PO TABS
1000.0000 mg | ORAL_TABLET | Freq: Three times a day (TID) | ORAL | Status: DC
  Administered 2016-06-07 – 2016-06-08 (×4): 1000 mg via ORAL
  Filled 2016-06-07 (×6): qty 2

## 2016-06-07 MED ORDER — NAPROXEN 250 MG PO TABS
250.0000 mg | ORAL_TABLET | Freq: Three times a day (TID) | ORAL | Status: DC
  Administered 2016-06-07: 250 mg via ORAL
  Filled 2016-06-07 (×5): qty 1

## 2016-06-07 NOTE — Discharge Instructions (Signed)

## 2016-06-07 NOTE — Progress Notes (Signed)
SNS teaching done with patient.  Teach back provided.

## 2016-06-07 NOTE — Anesthesia Postprocedure Evaluation (Signed)
Anesthesia Post Note  Patient: [name] [name]  Procedure(s) Performed: * No procedures listed *  Patient location during evaluation: Mother Baby Anesthesia Type: Epidural Level of consciousness: awake and alert Pain management: pain level controlled Vital Signs Assessment: post-procedure vital signs reviewed and stable Respiratory status: spontaneous breathing, nonlabored ventilation and respiratory function stable Cardiovascular status: stable Postop Assessment: no headache, no backache and epidural receding Anesthetic complications: no     Last Vitals:  Vitals:   06/06/16 2319 06/07/16 0252  BP: 133/70 117/60  Pulse: 89 94  Resp: 18 18  Temp: 36.4 C 36.7 C    Last Pain:  Vitals:   06/07/16 0252  TempSrc: Oral  PainSc:                  KEPHART,WILLIAM K

## 2016-06-07 NOTE — Progress Notes (Signed)
Post Partum Day 1  Subjective:  Pt doing well, up walking around room after morning shower. [name] [name] and infant at beside. Reports back and knee soreness.   Last night, she decided to start breastfeeding and reports two successful nursing sessions. Requesting Rx for breast pump.   Prior to labor, [name] [name] visited a neurologist for her Bells Palsy and was started on Valacyclovir 1000mg  PO TID and would like to continue that medication while she is here in the hospital.   Pt is allergic to Motrin, but reports taking Aleve in the past with no reactive.   Denies difficulty breathing or respiratory distress, chest pain, abdominal pain or cramping and leg pain or swelling.   Objective:  Temp:  [97.6 F (36.4 C)-98.6 F (37 C)] 98.1 F (36.7 C) (03/24 0252) Pulse Rate:  [80-94] 94 (03/24 0252) Resp:  [18] 18 (03/24 0252) BP: (116-133)/(57-91) 117/60 (03/24 0252) SpO2:  [98 %-99 %] 99 % (03/24 0252)  Physical Exam:   General: alert and cooperative, no apparent distress.   Lochia: appropriate.  Uterine Fundus: firm.  Incision: healing well.  DVT Evaluation: No evidence of DVT seen on physical exam. Negative Homan's sign. No significant calf/ankle edema.   Recent Labs  06/06/16 0041 06/06/16 0924  HGB 9.8* 8.8*  HCT 30.0* 27.6*    Assessment:  Postpartum Day 1, s/p NSVD Breastfeeding O positive Bells Palsy   Plan:  Routine postpartum teaching including: breastfeeding techniques and breast care, uterine involution and lochia progression, Baby blues, signs and symptoms of postpartum depression and psychosis, and red flag symptoms and when to call.  Encouraged to separate labia and apply Vaseline as need to promote healing.   Advised OTC salicylic acid as hormonal acne management.   Discussed postpartum pregnancy prevention options including: Implant, IUD, mini pill, and depo shot. Pt desires mini pill as pregnancy prevention.   Will complete breast pump form and fax to  company.   Start Naproxen for postpartum cramping, see orders.   Resume valacyclovir for Bells Palsy, see orders.   Plan for discharge tomorrow due to infant with insufficient GBS treatment.    LOS: 1 day   [name] [name] [name] [name] [name] [name] [name] [name] [name] [name], CNM 06/07/2016, 8:58 AM

## 2016-06-08 MED ORDER — NAPROXEN 250 MG PO TABS: 250.0000 mg | ORAL_TABLET | Freq: Three times a day (TID) | ORAL | 0 refills | Status: DC

## 2016-06-08 NOTE — Discharge Summary (Signed)
Obstetric Discharge Summary  Patient ID: [name] [name] MRN: [id number] DOB/AGE: 20/07/1995 20 y.o.  Date of Admission: 06/05/2016 [name] [name] Markesia [name], CNM [facility]([name] [name])  Date of Discharge: 06/08/2016 [name] [name] Tayveon Lombardo, CNM [facility]([name] [name])  Admitting Diagnosis: Onset of Labor at 2015w2d  Secondary Diagnosis: Anemia in pregnancy and GBS positive and Bells Palsy  Mode of Delivery: normal spontaneous vaginal delivery     Discharge Diagnosis: s/p NSVD, Postpartum anemia   Intrapartum Procedures: epidural and GBS prophylaxis   Post partum procedures: N/A  Complications: labial laceration repaired   Brief Hospital Course   [name] [name] is a G1P0 who had a SVD on 06/06/2016;  for further details of this birth, please refer to the delivey note.  Patient had an uncomplicated postpartum course.  By time of discharge on PPD#2, her pain was controlled on oral pain medications; she had appropriate lochia and was ambulating, voiding without difficulty and tolerating regular diet.  She was deemed stable for discharge to home.    Labs: CBC Latest Ref Rng & Units 06/06/2016 06/06/2016 03/14/2016  WBC 3.6 - 11.0 K/uL 18.7(H) 19.2(H) -  Hemoglobin 12.0 - 16.0 g/dL 4.0(J8.8(L) 8.1(X9.8(L) -  Hematocrit 35.0 - 47.0 % 27.6(L) 30.0(L) 38.7  Platelets 150 - 440 K/uL 228 247 -   O POS  Physical exam:   Blood pressure 124/61, pulse 80, temperature 98.2 F (36.8 C), temperature source Oral, resp. rate 17, height 5\' 3"  (1.6 m), weight 190 lb (86.2 kg), last menstrual period 08/29/2015, SpO2 100 %, unknown if currently breastfeeding.  General: alert and no distress  Breast: free from bleeding and cracking  Lochia: appropriate  Abdomen: soft, NT  Uterine Fundus: firm  Laceration: healing well, no significant erythema  Extremities: No evidence of DVT seen on physical exam. No lower extremity edema.  Discharge Instructions: Per After Visit Summary.  Activity: Advance as tolerated. Pelvic rest for 6 weeks.  Also  refer to After Visit Summary  Diet: Regular  Medications: Allergies as of 06/08/2016      Reactions   Ibuprofen Nausea Only      Medication List    STOP taking these medications   acetaminophen-codeine 300-30 MG tablet Commonly known as:  TYLENOL #3   Butalbital-APAP-Caffeine 50-325-40 MG capsule   predniSONE 20 MG tablet Commonly known as:  DELTASONE     TAKE these medications   acetaminophen 325 MG tablet Commonly known as:  TYLENOL Take 2 tablets (650 mg total) by mouth every 4 (four) hours as needed.   albuterol 108 (90 Base) MCG/ACT inhaler Commonly known as:  PROVENTIL HFA;VENTOLIN HFA Inhale 1-2 puffs into the lungs every 4 (four) hours as needed for wheezing or shortness of breath.   CITRANATAL HARMONY 27-1-260 MG Caps Take 27 mg by mouth daily.   naproxen 250 MG tablet Commonly known as:  NAPROSYN Take 1 tablet (250 mg total) by mouth 3 (three) times daily with meals.      Outpatient follow up:  Follow-up Information    [name] [name], CNM. Schedule an appointment as soon as possible for a visit in 6 week(s).   Specialties:  Certified Nurse Midwife, Obstetrics and Gynecology, Radiology Why:  please call [facility] and make your 6 week follow up appointment Contact information: [address] [place] [id number] [phone]          Postpartum contraception: oral progesterone-only contraceptive  Discharged Condition: good  Discharged to: home   Newborn Data: Disposition:home with mother  Apgars: APGAR (1 MIN): 8  APGAR (5 MINS): 9   APGAR (10 MINS):    Baby Feeding: Breast and supplementation as needed   [name] [name], CNM 06/08/2016 1025

## 2016-06-08 NOTE — Progress Notes (Signed)
Pt discharged home with infant.  Discharge instructions and follow up appointment given to and reviewed with pt.  Pt verbalized understanding.  Escorted by staff. 

## 2016-06-09 ENCOUNTER — Other Ambulatory Visit: Payer: Self-pay | Admitting: Certified Nurse Midwife

## 2016-06-09 MED ORDER — NORETHINDRONE 0.35 MG PO TABS: 1.0000 | ORAL_TABLET | Freq: Every day | ORAL | 4 refills | Status: DC

## 2016-06-09 MED ORDER — FERROUS SULFATE 325 (65 FE) MG PO TABS: 325.0000 mg | ORAL_TABLET | Freq: Two times a day (BID) | ORAL | 3 refills | Status: DC

## 2016-06-10 ENCOUNTER — Telehealth: Payer: Self-pay | Admitting: *Deleted

## 2016-06-10 NOTE — Telephone Encounter (Addendum)
Patient came in office today and  is interested in a breast pump. Patient has [employer] and [employer]. Patient is wondering if MIchelle can write a RX for her. She states you can give her a call back at [phone]. Thank you ou

## 2016-06-10 NOTE — Telephone Encounter (Signed)
Pt informed the JML sent in the paper work for her breast pump yesterday, am. Also informed she was prescribed Camila with start date and take iron, OTC. Pt did state picked up her updated FMLA paperwork and copy of lab results.

## 2016-06-12 DIAGNOSIS — E559 Vitamin D deficiency, unspecified: Secondary | ICD-10-CM | POA: Diagnosis not present

## 2016-06-12 DIAGNOSIS — H538 Other visual disturbances: Secondary | ICD-10-CM | POA: Diagnosis not present

## 2016-06-12 DIAGNOSIS — G500 Trigeminal neuralgia: Secondary | ICD-10-CM | POA: Diagnosis not present

## 2016-06-12 DIAGNOSIS — R201 Hypoesthesia of skin: Secondary | ICD-10-CM | POA: Diagnosis not present

## 2016-06-12 DIAGNOSIS — Z79899 Other long term (current) drug therapy: Secondary | ICD-10-CM | POA: Diagnosis not present

## 2016-06-12 DIAGNOSIS — E538 Deficiency of other specified B group vitamins: Secondary | ICD-10-CM | POA: Diagnosis not present

## 2016-06-12 DIAGNOSIS — G510 Bell's palsy: Secondary | ICD-10-CM | POA: Diagnosis not present

## 2016-06-19 DIAGNOSIS — Z3483 Encounter for supervision of other normal pregnancy, third trimester: Secondary | ICD-10-CM | POA: Diagnosis not present

## 2016-06-19 DIAGNOSIS — Z3482 Encounter for supervision of other normal pregnancy, second trimester: Secondary | ICD-10-CM | POA: Diagnosis not present

## 2016-06-27 ENCOUNTER — Telehealth: Payer: Self-pay

## 2016-06-27 ENCOUNTER — Ambulatory Visit (INDEPENDENT_AMBULATORY_CARE_PROVIDER_SITE_OTHER): Payer: BLUE CROSS/BLUE SHIELD | Admitting: Certified Nurse Midwife

## 2016-06-27 VITALS — BP 125/78 | HR 103 | Temp 98.0°F | Resp 18 | Wt 176.0 lb

## 2016-06-27 DIAGNOSIS — N611 Abscess of the breast and nipple: Secondary | ICD-10-CM | POA: Diagnosis not present

## 2016-06-27 DIAGNOSIS — N61 Mastitis without abscess: Secondary | ICD-10-CM

## 2016-06-27 MED ORDER — SULFAMETHOXAZOLE-TRIMETHOPRIM 800-160 MG PO TABS: 1.0000 | ORAL_TABLET | Freq: Two times a day (BID) | ORAL | 0 refills | Status: DC

## 2016-06-27 NOTE — Patient Instructions (Signed)
Breastfeeding and Mastitis  Mastitis is inflammation of the breast tissue. It can occur in women who are breastfeeding. This can make breastfeeding painful. Mastitis will sometimes go away on its own, especially if it is not caused by an infection (non-infectious mastitis). Your health care provider will help determine if medical treatment is needed. Treatment may be needed if the condition is caused by a bacterial infection (infectious mastitis).  What are the causes?  This condition is often associated with a blocked milkduct, which can happen when too much milk builds up in the breast. Causes of excess milk in the breast can include:  · Poor latch-on. If your baby is not latched onto the breast properly, he or she may not empty your breast completely while breastfeeding.  · Allowing too much time to pass between feedings.  · Wearing a bra or other clothing that is too tight. This puts extra pressure on the milk ducts so milk does not flow through them as it should.  · Milk remaining in the breast because it is overfilled (engorged).  · Stress and fatigue.    Mastitis can also be caused by a bacterial infection. Bacteria may enter the breast tissue through cuts, cracks, or openings in the skin near the nipple area. Cracks in the skin are often caused when your baby does not latch on properly to the breast.  What are the signs or symptoms?  Symptoms of this condition include:  · Swelling, redness, tenderness, and pain in an area of the breast. This usually affects the upper part of the breast, toward the armpit region. In most cases, it affects only one breast. In some cases, it may occur on both breasts at the same time and affect a larger portion of breast tissue.  · Swelling of the glands under the arm on the same side.  · Fatigue, headache, and flu-like muscle aches.  · Fever.  · Rapid pulse.    Symptoms usually last 2 to 5 days. Breast pain and redness are at their worst on day 2 and day 3, and they usually go  away by day 5. If an infection is left to progress, a collection of pus (abscess) may develop.  How is this diagnosed?  This condition can be diagnosed based on your symptoms and a physical exam. You may also have tests, such as:  · Blood tests to determine if your body is fighting a bacterial infection.  · Mammogram or ultrasound tests to rule out other problems or diseases.  · Fluid tests. If an abscess has developed, the fluid in the abscess may be removed with a needle. The fluid may be analyzed to determine if bacteria are present.  · Breast milk may be cultured and tested for bacteria.    How is this treated?  This condition will sometimes go away on its own. Your health care provider may choose to wait 24 hours after first seeing you to decide whether treatment is needed. If treatment is needed, it may include:  · Strategies to manage breastfeeding. This includes continuing to breastfeed or pump in order to allow adequate milk flow, using breast massage, and applying heat or cold to the affected area.  · Self-care such as rest and increased fluid intake.  · Medicine for pain.  · Antibiotic medicine to treat a bacterial infection. This is usually taken by mouth.  · If an abscess has developed, it may be treated by removing fluid with a needle.      Follow these instructions at home:  Medicines  · Take over-the-counter and prescription medicines only as told by your health care provider.  · If you were prescribed an antibiotic medicine, take it as told by your health care provider. Do not stop taking the antibiotic even if you start to feel better.  General instructions  · Do not wear a tight or underwire bra. Wear a soft, supportive bra.  · Increase your fluid intake, especially if you have a fever.  · Get plenty of rest.  For breastfeeding:  · Continue to empty your breasts as often as possible, either by breastfeeding or using an electric breast pump. This will lower the pressure and the pain that comes with  it. Ask your health care provider if changes need to be made to your breastfeeding or pumping routine.  · Keep your nipples clean and dry.  · During breastfeeding, empty the first breast completely before going to the other breast. If your baby is not emptying your breasts completely, use a breast pump to empty your breasts.  · Use breast massage during feeding or pumping sessions.  · If directed, apply moist heat to the affected area of your breast right before breastfeeding or pumping. Use the heat source that your health care provider recommends.  · If directed, put ice on the affected area of your breast right after breastfeeding or pumping:  ? Put ice in a plastic bag.  ? Place a towel between your skin and the bag.  ? Leave the ice on for 20 minutes.  · If you go back to work, pump your breasts while at work to stay in time with your nursing schedule.  · Do not allow your breasts to become engorged.  Contact a health care provider if:  · You have pus-like discharge from the breast.  · You have a fever.  · Your symptoms do not improve within 2 days of starting treatment.  · Your symptoms return after you have recovered from a breast infection.  Get help right away if:  · Your pain and swelling are getting worse.  · You have pain that is not controlled with medicine.  · You have a red line extending from the breast toward your armpit.  Summary  · Mastitis is inflammation of the breast tissue. It is often caused by a blocked milk duct or bacteria.  · This condition may be treated with hot and cold compresses, medicines, self-care, and certain breastfeeding strategies.  · If you were prescribed an antibiotic medicine, take it as told by your health care provider. Do not stop taking the antibiotic even if you start to feel better.  · Continue to empty your breasts as often as possible either by breastfeeding or using an electric breast pump.  This information is not intended to replace advice given to you by your  health care provider. Make sure you discuss any questions you have with your health care provider.  Document Released: 06/28/2004 Document Revised: 03/04/2016 Document Reviewed: 03/04/2016  Elsevier Interactive Patient Education © 2017 [employer]

## 2016-06-27 NOTE — Telephone Encounter (Signed)
Pt mother calls of which she states her daughter called yesterday but no message in chart. I said maybe she accidentally called wrong place and mother stated her daughter is 21 yo and should be aware of who she called. She did not know who she spoke with, she did not recognize the voices. Mother did state she had been on hold x 30 min when I picked up phone and was coming to the office at this time and I informed pt to wait and let me see what we could do. Pt had been to the neurologist and pediatrian and baby diagnosised with staff infection. Mother states pt has green pus coming out of left side of nipple, which scabs over, unsure of fever-pt c/o being hot and cold.  The pediatrician said pt had mastitis and the nuerologist ordered her Keflex. I had pt mother on hold for about , off/on telling her I was checking with a provider and that they were seeing pts.  I spoke with A.Janee Morn, CNM first and if pt was close by to come to office or ER. She needed the discharge cultured before started ATB.  Pt's mother said she wasn't sitting in the ER for 6hrs and was home in [place]. Mother wanting to speak with A.Janee Morn and took her number to call her back.  In the mean time M.Lawhorn, CNM asked about this and she said for her to come on to office, okay if after 5pm and she would see her.  I called "Lorene Dy" pt's mother and she is to bring her to office.

## 2016-06-29 NOTE — Progress Notes (Signed)
GYN ENCOUNTER NOTE  Subjective:       [name] [name] is a 21 y.o. G1P1 female here for evaluation of mastitis.   She is three (3) weeks postpartum after a NSVD on 06/06/2016 at [facility]. She has been breastfeeding and pumping without difficulty. She has been seen by lactation.   She reports "flu-like symptoms" and a red dime sized open sore with yellow drainage on her left breast for the last week.   Her son was seen by his pediatrician earlier today and diagnosed with MRSA. The sore on her breast is believed to be the source.   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 She has not started the Keflex.   Denies difficulty breathing or respiratory distress, chest pain, abdominal pain, and leg pain or swelling.    Gynecologic History  LMP: 08/29/2015. Pt is currently breastfeeding.   Contraception: oral progesterone-only contraceptive   Obstetric History OB History  Gravida Para Term Preterm AB Living  SAB TAB Ectopic Multiple Live Births          1    # Outcome Date GA Lbr Len/2nd Weight Sex Delivery Anes PTL Lv  1 Term 06/06/16 [redacted]w[redacted]d   M Vag-Spont EPI  LIV    Obstetric Comments  LMP 08/29/2015 EDD 03/24/02018     Past Medical History:  Diagnosis Date  . Abdominal pain   . Anal fissure   . Asthma   . Constipation   . H/O chlamydia infection 10/30/2015  . Headache     Past Surgical History:  Procedure Laterality Date  . KNEE SURGERY Left 2014, 2015    Current Outpatient Prescriptions on File Prior to Visit  Medication Sig Dispense Refill  . albuterol (PROVENTIL HFA;VENTOLIN HFA) 108 (90 Base) MCG/ACT inhaler Inhale 1-2 puffs into the lungs every 4 (four) hours as needed for wheezing or shortness of breath. 1 Inhaler 3  . ferrous sulfate 325 (65 FE) MG tablet Take 1 tablet (325 mg total) by mouth 2 (two) times daily with a meal. 60 tablet 3  . naproxen (NAPROSYN) 250  MG tablet Take 1 tablet (250 mg total) by mouth 3 (three) times daily with meals. 100 tablet 0  . norethindrone (MICRONOR,CAMILA,ERRIN) 0.35 MG tablet Take 1 tablet (0.35 mg total) by mouth daily. May start four (4) weeks postpartum, no sooner that 07/04/2016. (Patient not taking: Reported on 07/02/2016) 3 Package 4  . Prenat-FeFmCb-DSS-FA-DHA w/o A (CITRANATAL HARMONY) 27-1-260 MG CAPS Take 27 mg by mouth daily. (Patient not taking: Reported on 07/02/2016) 30 capsule 11   No current facility-administered medications on file prior to visit.     Allergies  Allergen Reactions  . Ibuprofen Nausea Only    Social History   Social History  . Marital status: Single    Spouse name: N/A  . Number of children: N/A  . Years of education: N/A   Occupational History  . Not on file.   Social History Main Topics  . Smoking status: Never Smoker  . Smokeless tobacco: Never Used  . Alcohol use No  . Drug use: No  . Sexual activity: Not Currently    Partners: Male    Birth control/ protection: Condom   Other Topics Concern  . Not on file   Social History Narrative   11th grade 2014-2015    Family History  Problem Relation  Age of Onset  . Cancer Sister   . Seizures Sister   . Migraines Mother   . Diabetes Paternal Grandmother   . Celiac disease Neg Hx   . Cholelithiasis Neg Hx   . Ulcers Neg Hx     The following portions of the patient's history were reviewed and updated as appropriate: allergies, current medications, past family history, past medical history, past social history, past surgical history and problem list.  Review of Systems  Review of Systems - Negative except as noted above History obtained from the patient  Objective:   BP 125/78   Pulse (!) 103   Temp 98 F (36.7 C)   Resp 18   Wt 176 lb (79.8 kg)   Breastfeeding? Yes   BMI 31.18 kg/m    BREASTS:   Dime sized open abscess to the 3 oclock portion of the left breast leaking purulent fluid, tender to  touch, erythema present.   Right breast WNL  Assessment:   1. Breast abscess  2. Mastitis, right, acute  Plan:   Abscess and breast milk cultures collected.   Left breast abscess cleaned and irrigated. Wet to dry dressing placed on top.   Advised pumping with larger flange, good hand hygiene, warm compresses, less sugar and fruit, more water, and 1000 mg Tylenol every four hours as needed.   Rx Bactrim DS, see orders.   Reviewed red flag symptoms and when to call.   RTC x 3 days for wound check.    [name], CNM

## 2016-06-30 ENCOUNTER — Other Ambulatory Visit: Payer: Self-pay | Admitting: Certified Nurse Midwife

## 2016-06-30 ENCOUNTER — Telehealth: Payer: Self-pay | Admitting: Certified Nurse Midwife

## 2016-06-30 DIAGNOSIS — N611 Abscess of the breast and nipple: Secondary | ICD-10-CM | POA: Diagnosis not present

## 2016-06-30 DIAGNOSIS — N61 Mastitis without abscess: Secondary | ICD-10-CM | POA: Diagnosis not present

## 2016-06-30 MED ORDER — CLINDAMYCIN HCL 300 MG PO CAPS: 300.0000 mg | ORAL_CAPSULE | Freq: Four times a day (QID) | ORAL | 0 refills | Status: AC

## 2016-06-30 MED ORDER — FLUCONAZOLE 150 MG PO TABS
150.0000 mg | ORAL_TABLET | Freq: Once | ORAL | 0 refills | Status: AC
Start: 2016-06-30 — End: 2016-06-30

## 2016-06-30 NOTE — Telephone Encounter (Signed)
Patient called stating the medication you gave her on Friday made her break out in hives, she would like something else. Thanks

## 2016-06-30 NOTE — Telephone Encounter (Signed)
Notified pt of results 

## 2016-06-30 NOTE — Telephone Encounter (Signed)
Amy,   Would you please contact the patient and let her know to stop taking her Bactrim. She probably had a reaction to the sulfa in the antibiotic.   I sent in a prescription for clindamycin 300 mg PO QID x 10 days. I also sent in two (2) Diflucan 150 mg, she should take one (1) now and one (1) when she finishes the antibiotic.   [name], CNM

## 2016-07-02 ENCOUNTER — Encounter: Payer: Self-pay | Admitting: Certified Nurse Midwife

## 2016-07-02 ENCOUNTER — Ambulatory Visit (INDEPENDENT_AMBULATORY_CARE_PROVIDER_SITE_OTHER): Payer: BLUE CROSS/BLUE SHIELD | Admitting: Certified Nurse Midwife

## 2016-07-02 VITALS — BP 116/80 | HR 77 | Ht 63.0 in | Wt 174.4 lb

## 2016-07-02 DIAGNOSIS — O9122 Nonpurulent mastitis associated with the puerperium: Secondary | ICD-10-CM

## 2016-07-02 DIAGNOSIS — Z09 Encounter for follow-up examination after completed treatment for conditions other than malignant neoplasm: Secondary | ICD-10-CM

## 2016-07-02 DIAGNOSIS — O9112 Abscess of breast associated with the puerperium: Secondary | ICD-10-CM | POA: Diagnosis not present

## 2016-07-04 LAB — ANAEROBIC AND AEROBIC CULTURE

## 2016-07-05 LAB — ANAEROBIC AND AEROBIC CULTURE

## 2016-07-07 ENCOUNTER — Other Ambulatory Visit: Payer: Self-pay | Admitting: Certified Nurse Midwife

## 2016-07-09 ENCOUNTER — Encounter: Payer: Self-pay | Admitting: Certified Nurse Midwife

## 2016-07-16 NOTE — Progress Notes (Signed)
GYN ENCOUNTER NOTE  Subjective:       [name] [name] is a 21 y.o. G21P1001 female here for follow up examination.   Previously seen on 06/27/2016 and diagnosed with acute mastitis and left breast abscess.   Oral antibiotic was changed from Bactrim DS to clindamycin after allergic reaction. [name] [name] is tolerating the clindamycin and reports moderate relief of symptoms.   She continues daily wound care regimen as well as practices good hand hygiene.   Denies difficulty breathing or respiratory distress, chest pain, fever, abdominal pain, excessive vaginal bleeding, and leg pain or swelling.    Gynecologic History  LMP: 08/29/2015. Postpartum, currently breastfeeding.   Contraception: abstinence and oral progesterone-only contraceptive   Last Pap: N/A.   Obstetric History OB History  Gravida Para Term Preterm AB Living  SAB TAB Ectopic Multiple Live Births          1    # Outcome Date GA Lbr Len/2nd Weight Sex Delivery Anes PTL Lv  1 Term 06/06/16 [redacted]w[redacted]d   M Vag-Spont EPI  LIV    Obstetric Comments  LMP 08/29/2015 EDD 03/24/02018     Past Medical History:  Diagnosis Date  . Abdominal pain   . Anal fissure   . Asthma   . Constipation   . H/O chlamydia infection 10/30/2015  . Headache     Past Surgical History:  Procedure Laterality Date  . KNEE SURGERY Left 2014, 2015    Current Outpatient Prescriptions on File Prior to Visit  Medication Sig Dispense Refill  . albuterol (PROVENTIL HFA;VENTOLIN HFA) 108 (90 Base) MCG/ACT inhaler Inhale 1-2 puffs into the lungs every 4 (four) hours as needed for wheezing or shortness of breath. 1 Inhaler 3  . ferrous sulfate 325 (65 FE) MG tablet Take 1 tablet (325 mg total) by mouth 2 (two) times daily with a meal. 60 tablet 3  . naproxen (NAPROSYN) 250 MG tablet Take 1 tablet (250 mg total) by mouth 3 (three) times daily with meals. 100 tablet 0  . norethindrone (MICRONOR,CAMILA,ERRIN) 0.35 MG tablet Take 1 tablet (0.35 mg total)  by mouth daily. May start four (4) weeks postpartum, no sooner that 07/04/2016. (Patient not taking: Reported on 07/02/2016) 3 Package 4  . Prenat-FeFmCb-DSS-FA-DHA w/o A (CITRANATAL HARMONY) 27-1-260 MG CAPS Take 27 mg by mouth daily. (Patient not taking: Reported on 07/02/2016) 30 capsule 11   No current facility-administered medications on file prior to visit.     Allergies  Allergen Reactions  . Ibuprofen Nausea Only    Social History   Social History  . Marital status: Single    Spouse name: N/A  . Number of children: N/A  . Years of education: N/A   Occupational History  . Not on file.   Social History Main Topics  . Smoking status: Never Smoker  . Smokeless tobacco: Never Used  . Alcohol use No  . Drug use: No  . Sexual activity: Not Currently    Partners: Male    Birth control/ protection: Condom   Other Topics Concern  . Not on file   Social History Narrative   11th grade 2014-2015    Family History  Problem Relation Age of Onset  . Cancer Sister   . Seizures Sister   . Migraines Mother   . Diabetes Paternal Grandmother   . Celiac disease Neg Hx   . Cholelithiasis Neg Hx   . Ulcers Neg Hx  The following portions of the patient's history were reviewed and updated as appropriate: allergies, current medications, past family history, past medical history, past social history, past surgical history and problem list.  Review of Systems  Review of Systems - Negative except as noted above.  History obtained from the patient.   Objective:   BP 116/80   Pulse 77   Ht  (1.6 m)   Wt 174 lb 6.4 oz (79.1 kg)   Breastfeeding? Yes   BMI 30.89 kg/m    GENERAL: Alert and oriented x 4, no apparent distress  BREASTS: Left breast abscess open, but healing with clear drainage present; non-tender. Right breast WNL.   Anaerobic and aerobic cultures of milk and breast abscess obtained 06/27/2016 positive for MRSA  Assessment:   1. Abscess of breast,  postpartum  2. Follow-up examination  3. Mastitis, obstetric, postpartum condition  Plan:   Continue Clindamycin for 14 days, see orders.   Continue wound care and mastitis management/prevention instructions.   Reviewed red flag symptoms and when to call.   Scheduled six (6) week postpartum visit.    [name] [name], CNM

## 2016-07-16 NOTE — Patient Instructions (Signed)
Breastfeeding and Mastitis  Mastitis is inflammation of the breast tissue. It can occur in women who are breastfeeding. This can make breastfeeding painful. Mastitis will sometimes go away on its own, especially if it is not caused by an infection (non-infectious mastitis). Your health care provider will help determine if medical treatment is needed. Treatment may be needed if the condition is caused by a bacterial infection (infectious mastitis).  What are the causes?  This condition is often associated with a blocked milkduct, which can happen when too much milk builds up in the breast. Causes of excess milk in the breast can include:  · Poor latch-on. If your baby is not latched onto the breast properly, he or she may not empty your breast completely while breastfeeding.  · Allowing too much time to pass between feedings.  · Wearing a bra or other clothing that is too tight. This puts extra pressure on the milk ducts so milk does not flow through them as it should.  · Milk remaining in the breast because it is overfilled (engorged).  · Stress and fatigue.    Mastitis can also be caused by a bacterial infection. Bacteria may enter the breast tissue through cuts, cracks, or openings in the skin near the nipple area. Cracks in the skin are often caused when your baby does not latch on properly to the breast.  What are the signs or symptoms?  Symptoms of this condition include:  · Swelling, redness, tenderness, and pain in an area of the breast. This usually affects the upper part of the breast, toward the armpit region. In most cases, it affects only one breast. In some cases, it may occur on both breasts at the same time and affect a larger portion of breast tissue.  · Swelling of the glands under the arm on the same side.  · Fatigue, headache, and flu-like muscle aches.  · Fever.  · Rapid pulse.    Symptoms usually last 2 to 5 days. Breast pain and redness are at their worst on day 2 and day 3, and they usually go  away by day 5. If an infection is left to progress, a collection of pus (abscess) may develop.  How is this diagnosed?  This condition can be diagnosed based on your symptoms and a physical exam. You may also have tests, such as:  · Blood tests to determine if your body is fighting a bacterial infection.  · Mammogram or ultrasound tests to rule out other problems or diseases.  · Fluid tests. If an abscess has developed, the fluid in the abscess may be removed with a needle. The fluid may be analyzed to determine if bacteria are present.  · Breast milk may be cultured and tested for bacteria.    How is this treated?  This condition will sometimes go away on its own. Your health care provider may choose to wait 24 hours after first seeing you to decide whether treatment is needed. If treatment is needed, it may include:  · Strategies to manage breastfeeding. This includes continuing to breastfeed or pump in order to allow adequate milk flow, using breast massage, and applying heat or cold to the affected area.  · Self-care such as rest and increased fluid intake.  · Medicine for pain.  · Antibiotic medicine to treat a bacterial infection. This is usually taken by mouth.  · If an abscess has developed, it may be treated by removing fluid with a needle.      Follow these instructions at home:  Medicines  · Take over-the-counter and prescription medicines only as told by your health care provider.  · If you were prescribed an antibiotic medicine, take it as told by your health care provider. Do not stop taking the antibiotic even if you start to feel better.  General instructions  · Do not wear a tight or underwire bra. Wear a soft, supportive bra.  · Increase your fluid intake, especially if you have a fever.  · Get plenty of rest.  For breastfeeding:  · Continue to empty your breasts as often as possible, either by breastfeeding or using an electric breast pump. This will lower the pressure and the pain that comes with  it. Ask your health care provider if changes need to be made to your breastfeeding or pumping routine.  · Keep your nipples clean and dry.  · During breastfeeding, empty the first breast completely before going to the other breast. If your baby is not emptying your breasts completely, use a breast pump to empty your breasts.  · Use breast massage during feeding or pumping sessions.  · If directed, apply moist heat to the affected area of your breast right before breastfeeding or pumping. Use the heat source that your health care provider recommends.  · If directed, put ice on the affected area of your breast right after breastfeeding or pumping:  ? Put ice in a plastic bag.  ? Place a towel between your skin and the bag.  ? Leave the ice on for 20 minutes.  · If you go back to work, pump your breasts while at work to stay in time with your nursing schedule.  · Do not allow your breasts to become engorged.  Contact a health care provider if:  · You have pus-like discharge from the breast.  · You have a fever.  · Your symptoms do not improve within 2 days of starting treatment.  · Your symptoms return after you have recovered from a breast infection.  Get help right away if:  · Your pain and swelling are getting worse.  · You have pain that is not controlled with medicine.  · You have a red line extending from the breast toward your armpit.  Summary  · Mastitis is inflammation of the breast tissue. It is often caused by a blocked milk duct or bacteria.  · This condition may be treated with hot and cold compresses, medicines, self-care, and certain breastfeeding strategies.  · If you were prescribed an antibiotic medicine, take it as told by your health care provider. Do not stop taking the antibiotic even if you start to feel better.  · Continue to empty your breasts as often as possible either by breastfeeding or using an electric breast pump.  This information is not intended to replace advice given to you by your  health care provider. Make sure you discuss any questions you have with your health care provider.  Document Released: 06/28/2004 Document Revised: 03/04/2016 Document Reviewed: 03/04/2016  Elsevier Interactive Patient Education © 2017 [employer]

## 2016-07-18 ENCOUNTER — Ambulatory Visit (INDEPENDENT_AMBULATORY_CARE_PROVIDER_SITE_OTHER): Payer: BLUE CROSS/BLUE SHIELD | Admitting: Certified Nurse Midwife

## 2016-07-18 ENCOUNTER — Encounter: Payer: Self-pay | Admitting: Certified Nurse Midwife

## 2016-07-18 VITALS — BP 121/65 | HR 73 | Ht 63.0 in | Wt 176.2 lb

## 2016-07-18 DIAGNOSIS — F53 Postpartum depression: Secondary | ICD-10-CM

## 2016-07-18 DIAGNOSIS — O99345 Other mental disorders complicating the puerperium: Principal | ICD-10-CM

## 2016-07-18 DIAGNOSIS — Z30011 Encounter for initial prescription of contraceptive pills: Secondary | ICD-10-CM

## 2016-07-18 DIAGNOSIS — Z8669 Personal history of other diseases of the nervous system and sense organs: Secondary | ICD-10-CM

## 2016-07-18 MED ORDER — NORETHIN ACE-ETH ESTRAD-FE 1-20 MG-MCG PO TABS: 1.0000 | ORAL_TABLET | Freq: Every day | ORAL | 11 refills | Status: DC

## 2016-07-18 MED ORDER — ESCITALOPRAM OXALATE 10 MG PO TABS: 10.0000 mg | ORAL_TABLET | Freq: Every day | ORAL | 1 refills | Status: DC

## 2016-07-18 MED ORDER — VALACYCLOVIR HCL 1 G PO TABS: 1000.0000 mg | ORAL_TABLET | Freq: Three times a day (TID) | ORAL | 0 refills | Status: AC

## 2016-07-18 NOTE — Patient Instructions (Addendum)
Thank you for enrolling in MyChart. Please follow the instructions below to securely access your online medical record. MyChart allows you to send messages to your doctor, view your test results, renew your prescriptions, schedule appointments, and more.  How Do I Sign Up? 1. In your Internet browser, go to [email or web address] 2. Click on the New  User? link in the Sign In box.  3. Enter your MyChart Access Code exactly as it appears below. You will not need to use this code after you have completed the sign-up process. If you do not sign up before the expiration date, you must request a new code. MyChart Access Code: Activation code not generated Current MyChart Status: Active  4. Enter the last four digits of your Social Security Number (xxxx) and Date of Birth (mm/dd/yyyy) as indicated and click Next. You will be taken to the next sign-up page. 5. Create a MyChart ID. This will be your MyChart login ID and cannot be changed, so think of one that is secure and easy to remember. 6. Create a MyChart password. You can change your password at any time. 7. Enter your Password Reset Question and Answer and click Next. This can be used at a later time if you forget your password.  8. Select your communication preference, and if applicable enter your e-mail address. You will receive e-mail notification when new information is available in MyChart by choosing to receive e-mail notifications and filling in your e-mail. 9. Click Sign In. You can now view your medical record.   Additional Information If you have questions, you can email [email or web address] or call [phone] to talk to our MyChart staff. Remember, MyChart is NOT to be used for urgent needs. For medical emergencies, dial 911.   Ethinyl Estradiol; Norethindrone Acetate tablets (contraception) What is this medicine? ETHINYL ESTRADIOL; NORETHINDRONE ACETATE (ETH in il es tra DYE ole; nor eth IN drone AS e  tate) is an oral contraceptive. The products combine two types of female hormones, an estrogen and a progestin. They are used to prevent ovulation and pregnancy. This medicine may be used for other purposes; ask your health care provider or pharmacist if you have questions. COMMON BRAND NAME(S): Rogelia Mire 1.5/30, Junel 1/20, LARIN, Loestrin 1.5/30, Loestrin 1/20, Microgestin 1.5/30, Microgestin 1/20 What should I tell my health care provider before I take this medicine? They need to know if you have or ever had any of these conditions: -abnormal vaginal bleeding -blood vessel disease or blood clots -breast, cervical, endometrial, ovarian, liver, or uterine cancer -diabetes -gallbladder disease -heart disease or recent heart attack -high blood pressure -high cholesterol -kidney disease -liver disease -migraine headaches -stroke -systemic lupus erythematosus (SLE) -tobacco smoker -an unusual or allergic reaction to estrogens, progestins, other medicines, foods, dyes, or preservatives -pregnant or trying to get pregnant -breast-feeding How should I use this medicine? Take this medicine by mouth. To reduce nausea, this medicine may be taken with food. Follow the directions on the prescription label. Take this medicine at the same time each day and in the order directed on the package. Do not take your medicine more often than directed. Contact your pediatrician regarding the use of this medicine in children. Special care may be needed. This medicine has been used in female children who have started having menstrual periods. A patient package insert for the product will be given with each prescription and refill. Read this sheet carefully each time. The sheet may change frequently. Overdosage: If  you think you have taken too much of this medicine contact a poison control center or emergency room at once. NOTE: This medicine is only for you. Do not share this medicine with others. What if  I miss a dose? If you miss a dose, refer to the patient information sheet you received with your medicine for direction. If you miss more than one pill, this medicine may not be as effective and you may need to use another form of birth control. What may interact with this medicine? Do not take this medicine with the following medication: -dasabuvir; ombitasvir; paritaprevir; ritonavir -ombitasvir; paritaprevir; ritonavir This medicine may also interact with the following medications: -acetaminophen -antibiotics or medicines for infections, especially rifampin, rifabutin, rifapentine, and griseofulvin, and possibly penicillins or tetracyclines -aprepitant -ascorbic acid (vitamin C) -atorvastatin -barbiturate medicines, such as phenobarbital -bosentan -carbamazepine -caffeine -clofibrate -cyclosporine -dantrolene -doxercalciferol -felbamate -grapefruit juice -hydrocortisone -medicines for anxiety or sleeping problems, such as diazepam or temazepam -medicines for diabetes, including pioglitazone -mineral oil -modafinil -mycophenolate -nefazodone -oxcarbazepine -phenytoin -prednisolone -ritonavir or other medicines for HIV infection or AIDS -rosuvastatin -selegiline -soy isoflavones supplements -St. John's wort -tamoxifen or raloxifene -theophylline -thyroid hormones -topiramate -warfarin This list may not describe all possible interactions. Give your health care provider a list of all the medicines, herbs, non-prescription drugs, or dietary supplements you use. Also tell them if you smoke, drink alcohol, or use illegal drugs. Some items may interact with your medicine. What should I watch for while using this medicine? Visit your doctor or health care professional for regular checks on your progress. You will need a regular breast and pelvic exam and Pap smear while on this medicine. Use an additional method of contraception during the first cycle that you take these  tablets. If you have any reason to think you are pregnant, stop taking this medicine right away and contact your doctor or health care professional. If you are taking this medicine for hormone related problems, it may take several cycles of use to see improvement in your condition. Smoking increases the risk of getting a blood clot or having a stroke while you are taking birth control pills, especially if you are more than 10723 years old. You are strongly advised not to smoke. This medicine can make your body retain fluid, making your fingers, hands, or ankles swell. Your blood pressure can go up. Contact your doctor or health care professional if you feel you are retaining fluid. This medicine can make you more sensitive to the sun. Keep out of the sun. If you cannot avoid being in the sun, wear protective clothing and use sunscreen. Do not use sun lamps or tanning beds/booths. If you wear contact lenses and notice visual changes, or if the lenses begin to feel uncomfortable, consult your eye care specialist. In some women, tenderness, swelling, or minor bleeding of the gums may occur. Notify your dentist if this happens. Brushing and flossing your teeth regularly may help limit this. See your dentist regularly and inform your dentist of the medicines you are taking. If you are going to have elective surgery, you may need to stop taking this medicine before the surgery. Consult your health care professional for advice. This medicine does not protect you against HIV infection (AIDS) or any other sexually transmitted diseases. What side effects may I notice from receiving this medicine? Side effects that you should report to your doctor or health care professional as soon as possible: -breast tissue changes or discharge -changes in vaginal  bleeding during your period or between your periods -chest pain -coughing up blood -dizziness or fainting spells -headaches or migraines -leg, arm or groin  pain -severe or sudden headaches -stomach pain (severe) -sudden shortness of breath -sudden loss of coordination, especially on one side of the body -speech problems -symptoms of vaginal infection like itching, irritation or unusual discharge -tenderness in the upper abdomen -vomiting -weakness or numbness in the arms or legs, especially on one side of the body -yellowing of the eyes or skin Side effects that usually do not require medical attention (report to your doctor or health care professional if they continue or are bothersome): -breakthrough bleeding and spotting that continues beyond the 3 initial cycles of pills -breast tenderness -mood changes, anxiety, depression, frustration, anger, or emotional outbursts -increased sensitivity to sun or ultraviolet light -nausea -skin rash, acne, or brown spots on the skin -weight gain (slight) This list may not describe all possible side effects. Call your doctor for medical advice about side effects. You may report side effects to FDA at 1-800-FDA-1088. Where should I keep my medicine? Keep out of the reach of children. Store at room temperature between 15 and 30 degrees C (59 and 86 degrees F). Throw away any unused medicine after the expiration date. NOTE: This sheet is a summary. It may not cover all possible information. If you have questions about this medicine, talk to your doctor, pharmacist, or health care provider.  2018 Elsevier/Gold Standard (2015-11-12 08:02:50) Escitalopram tablets What is this medicine? ESCITALOPRAM (es sye TAL oh pram) is used to treat depression and certain types of anxiety. This medicine may be used for other purposes; ask your health care provider or pharmacist if you have questions. COMMON BRAND NAME(S): Lexapro What should I tell my health care provider before I take this medicine? They need to know if you have any of these conditions: -bipolar disorder or a family history of bipolar  disorder -diabetes -glaucoma -heart disease -kidney or liver disease -receiving electroconvulsive therapy -seizures (convulsions) -suicidal thoughts, plans, or attempt by you or a family member -an unusual or allergic reaction to escitalopram, the related drug citalopram, other medicines, foods, dyes, or preservatives -pregnant or trying to become pregnant -breast-feeding How should I use this medicine? Take this medicine by mouth with a glass of water. Follow the directions on the prescription label. You can take it with or without food. If it upsets your stomach, take it with food. Take your medicine at regular intervals. Do not take it more often than directed. Do not stop taking this medicine suddenly except upon the advice of your doctor. Stopping this medicine too quickly may cause serious side effects or your condition may worsen. A special MedGuide will be given to you by the pharmacist with each prescription and refill. Be sure to read this information carefully each time. Talk to your pediatrician regarding the use of this medicine in children. Special care may be needed. Overdosage: If you think you have taken too much of this medicine contact a poison control center or emergency room at once. NOTE: This medicine is only for you. Do not share this medicine with others. What if I miss a dose? If you miss a dose, take it as soon as you can. If it is almost time for your next dose, take only that dose. Do not take double or extra doses. What may interact with this medicine? Do not take this medicine with any of the following medications: -certain medicines for fungal  infections like fluconazole, itraconazole, ketoconazole, posaconazole, voriconazole -cisapride -citalopram -dofetilide -dronedarone -linezolid -MAOIs like Carbex, Eldepryl, Marplan, Nardil, and Parnate -methylene blue (injected into a vein) -pimozide -thioridazine -ziprasidone This medicine may also interact with  the following medications: -alcohol -amphetamines -aspirin and aspirin-like medicines -carbamazepine -certain medicines for depression, anxiety, or psychotic disturbances -certain medicines for migraine headache like almotriptan, eletriptan, frovatriptan, naratriptan, rizatriptan, sumatriptan, zolmitriptan -certain medicines for sleep -certain medicines that treat or prevent blood clots like warfarin, enoxaparin, dalteparin -cimetidine -diuretics -fentanyl -furazolidone -isoniazid -lithium -metoprolol -NSAIDs, medicines for pain and inflammation, like ibuprofen or naproxen -other medicines that prolong the QT interval (cause an abnormal heart rhythm) -procarbazine -rasagiline -supplements like St. John's wort, kava kava, valerian -tramadol -tryptophan This list may not describe all possible interactions. Give your health care provider a list of all the medicines, herbs, non-prescription drugs, or dietary supplements you use. Also tell them if you smoke, drink alcohol, or use illegal drugs. Some items may interact with your medicine. What should I watch for while using this medicine? Tell your doctor if your symptoms do not get better or if they get worse. Visit your doctor or health care professional for regular checks on your progress. Because it may take several weeks to see the full effects of this medicine, it is important to continue your treatment as prescribed by your doctor. Patients and their families should watch out for new or worsening thoughts of suicide or depression. Also watch out for sudden changes in feelings such as feeling anxious, agitated, panicky, irritable, hostile, aggressive, impulsive, severely restless, overly excited and hyperactive, or not being able to sleep. If this happens, especially at the beginning of treatment or after a change in dose, call your health care professional. Bonita Quin may get drowsy or dizzy. Do not drive, use machinery, or do anything that  needs mental alertness until you know how this medicine affects you. Do not stand or sit up quickly, especially if you are an older patient. This reduces the risk of dizzy or fainting spells. Alcohol may interfere with the effect of this medicine. Avoid alcoholic drinks. Your mouth may get dry. Chewing sugarless gum or sucking hard candy, and drinking plenty of water may help. Contact your doctor if the problem does not go away or is severe. What side effects may I notice from receiving this medicine? Side effects that you should report to your doctor or health care professional as soon as possible: -allergic reactions like skin rash, itching or hives, swelling of the face, lips, or tongue -anxious -black, tarry stools -changes in vision -confusion -elevated mood, decreased need for sleep, racing thoughts, impulsive behavior -eye pain -fast, irregular heartbeat -feeling faint or lightheaded, falls -feeling agitated, angry, or irritable -hallucination, loss of contact with reality -loss of balance or coordination -loss of memory -painful or prolonged erections -restlessness, pacing, inability to keep still -seizures -stiff muscles -suicidal thoughts or other mood changes -trouble sleeping -unusual bleeding or bruising -unusually weak or tired -vomiting Side effects that usually do not require medical attention (report to your doctor or health care professional if they continue or are bothersome): -changes in appetite -change in sex drive or performance -headache -increased sweating -indigestion, nausea -tremors This list may not describe all possible side effects. Call your doctor for medical advice about side effects. You may report side effects to FDA at 1-800-FDA-1088. Where should I keep my medicine? Keep out of reach of children. Store at room temperature between 15 and 30 degrees C (59  and 86 degrees F). Throw away any unused medicine after the expiration date. NOTE: This  sheet is a summary. It may not cover all possible information. If you have questions about this medicine, talk to your doctor, pharmacist, or health care provider.  2018 Elsevier/Gold Standard (2015-08-06 13:20:23)

## 2016-07-20 NOTE — Progress Notes (Signed)
Subjective:    [name] [name] is a 21 y.o. [id number] African American female who presents for a postpartum visit. She is 6 weeks postpartum following a spontaneous vaginal delivery at 6471w2d gestational weeks. Anesthesia: epidural.   I have fully reviewed the prenatal and intrapartum course. Postpartum course has been complicated by mastitis and left breast abscess requiring antibiotic treatment for MRSA. Baby's course has been complicated by antibiotic treatment for MRSA as well.   Baby is feeding by formula. Bleeding stopped two (2) weeks. Bowel function is normal. Bladder function is normal. Patient is sexually active. Last sexual activity: last week. Contraception method is OCP (estrogen/progesterone). Postpartum depression screening: positive. Score 10, PHQ.  Last pap: N/A.  The following portions of the patient's history were reviewed and updated as appropriate: allergies, current medications, past medical history, past surgical history and problem list.  Review of Systems Pertinent items are noted in HPI.   Vitals:   07/18/16 1549  BP: 121/65  Pulse: 73  Weight: 176 lb 3.2 oz (79.9 kg)  Height: 5\' 3"  (1.6 m)   Patient's last menstrual period was 07/11/2016.  Objective:   General:  alert, cooperative and no distress   Breasts:  deferred, no complaints  Lungs: clear to auscultation bilaterally  Heart:  regular rate and rhythm  Abdomen: soft, nontender   Vulva: normal  Vagina: normal vagina  Cervix:  closed  Corpus: Well-involuted  Adnexa:  Non-palpable        Assessment:   Postpartum exam  Six (6) wks s/p vaginal birth  Formula feeding  Depression screening  Contraception counseling   Plan:   Discussed PPD treatment including medications and therapy. Contact information given for postpartum counselor [place] in [place], [place].   Rx: Lexapro and Junel Fe, see orders.   Reviewed red flag symptoms and when to call.   RTC x 5-6 weeks for medication check or sooner if  needed.   Gunnar BullaJenkins Michelle Brittian Renaldo, CNM

## 2016-07-23 ENCOUNTER — Encounter: Payer: Self-pay | Admitting: Certified Nurse Midwife

## 2016-08-12 ENCOUNTER — Encounter: Payer: Self-pay | Admitting: Certified Nurse Midwife

## 2016-08-22 ENCOUNTER — Ambulatory Visit (INDEPENDENT_AMBULATORY_CARE_PROVIDER_SITE_OTHER): Payer: BLUE CROSS/BLUE SHIELD | Admitting: Certified Nurse Midwife

## 2016-08-22 VITALS — BP 129/64 | HR 80 | Ht 62.0 in | Wt 172.0 lb

## 2016-08-22 DIAGNOSIS — F419 Anxiety disorder, unspecified: Secondary | ICD-10-CM | POA: Diagnosis not present

## 2016-08-22 DIAGNOSIS — Z30016 Encounter for initial prescription of transdermal patch hormonal contraceptive device: Secondary | ICD-10-CM

## 2016-08-22 DIAGNOSIS — F329 Major depressive disorder, single episode, unspecified: Secondary | ICD-10-CM | POA: Diagnosis not present

## 2016-08-22 DIAGNOSIS — F32A Depression, unspecified: Secondary | ICD-10-CM

## 2016-08-22 MED ORDER — ESCITALOPRAM OXALATE 10 MG PO TABS: 10.0000 mg | ORAL_TABLET | Freq: Every day | ORAL | 5 refills | Status: DC

## 2016-08-22 MED ORDER — NORELGESTROMIN-ETH ESTRADIOL 150-35 MCG/24HR TD PTWK: 1.0000 | MEDICATED_PATCH | TRANSDERMAL | 12 refills | Status: DC

## 2016-08-22 NOTE — Patient Instructions (Signed)
Escitalopram oral solution  What is this medicine?  ESCITALOPRAM (es sye TAL oh pram) is used to treat depression and certain types of anxiety.  This medicine may be used for other purposes; ask your health care provider or pharmacist if you have questions.  COMMON BRAND NAME(S): Lexapro  What should I tell my health care provider before I take this medicine?  They need to know if you have any of these conditions:  -bipolar disorder or a family history of bipolar disorder  -diabetes  -glaucoma  -heart disease  -kidney or liver disease  -receiving electroconvulsive therapy  -seizures (convulsions)  -suicidal thoughts, plans, or attempt by you or a family member  -an unusual or allergic reaction to escitalopram, citalopram, other medicines, foods, dyes, or preservatives  -pregnant or trying to become pregnant  -breast-feeding  How should I use this medicine?  Take this medicine by mouth. Follow the directions on the prescription label. Use a specially marked spoon or container to measure your medicine. Ask your pharmacist if you do not have one. Household spoons are not accurate. This medicine can be taken with or without food. Take your medicine at regular intervals. Do not take it more often than directed. Do not stop taking this medicine suddenly except upon the advice of your doctor. Stopping this medicine too quickly may cause serious side effects or your condition may worsen.  A special MedGuide will be given to you by the pharmacist with each prescription and refill. Be sure to read this information carefully each time.  Talk to your pediatrician regarding the use of this medicine in children. Special care may be needed.  Overdosage: If you think you have taken too much of this medicine contact a poison control center or emergency room at once.  NOTE: This medicine is only for you. Do not share this medicine with others.  What if I miss a dose?  If you miss a dose, take it as soon as you can. If it is almost  time for your next dose, take only that dose. Do not take double or extra doses.  What may interact with this medicine?  Do not take this medicine with any of the following medications:  -certain medicines for fungal infections like fluconazole, itraconazole, ketoconazole, posaconazole, voriconazole  -cisapride  -citalopram  -dofetilide  -dronedarone  -linezolid  -MAOIs like Carbex, Eldepryl, Marplan, Nardil, and Parnate  -methylene blue (injected into a vein)  -pimozide  -thioridazine  -ziprasidone  This medicine may also interact with the following medications:  -alcohol  -amphetamines  -aspirin and aspirin-like medicines  -carbamazepine  -certain medicines for depression, anxiety, or psychotic disturbances  -certain medicines for migraine headache like almotriptan, eletriptan, frovatriptan, naratriptan, rizatriptan, sumatriptan, zolmitriptan  -certain medicines for sleep  -certain medicines that treat or prevent blood clots like warfarin, enoxaparin, and dalteparin  -cimetidine  -diuretics  -fentanyl  -furazolidone  -isoniazid  -lithium  -metoprolol  -NSAIDs, medicines for pain and inflammation, like ibuprofen or naproxen  -other medicines that prolong the QT interval (cause an abnormal heart rhythm)  -procarbazine  -rasagiline  -supplements like St. John's wort, kava kava, valerian  -tramadol  -tryptophan  This list may not describe all possible interactions. Give your health care provider a list of all the medicines, herbs, non-prescription drugs, or dietary supplements you use. Also tell them if you smoke, drink alcohol, or use illegal drugs. Some items may interact with your medicine.  What should I watch for while using this medicine?    or worsening thoughts of suicide or depression. Also watch out for sudden changes in feelings such as feeling anxious, agitated, panicky, irritable, hostile, aggressive, impulsive, severely restless, overly excited and hyperactive, or not being able to sleep. If this happens, especially at the beginning of treatment or after a change in dose, call your health care professional. Bonita QuinYou may get drowsy or dizzy. Do not drive, use machinery, or do anything that needs mental alertness until you know how this medicine affects you. Do not stand or sit up quickly, especially if you are an older patient. This reduces the risk of dizzy or fainting spells. Alcohol may interfere with the effect of this medicine. Avoid alcoholic drinks. Your mouth may get dry. Chewing sugarless gum or sucking hard candy, and drinking plenty of water may help. Contact your doctor if the problem does not go away or is severe. What side effects may I notice from receiving this medicine? Side effects that you should report to your doctor or health care professional as soon as possible: -allergic reactions like skin rash, itching or hives, swelling of the face, lips, or tongue -anxious -black, tarry stools -changes in vision -confusion -elevated mood, decreased need for sleep, racing thoughts, impulsive behavior -eye pain -fast, irregular heartbeat -feeling faint or lightheaded, falls -feeling agitated, angry, or irritable -hallucination, loss of contact with reality -loss of balance or coordination -loss of memory -restlessness, pacing, inability to keep still -seizures -stiff muscles -suicidal thoughts or other mood changes -trouble sleeping -unusual bleeding or bruising -unusually weak or tired -vomiting Side effects that usually do not require medical attention (report to your doctor or health care professional if they continue or are  bothersome): -changes in appetite -change in sex drive or performance -headache -increased sweating -indigestion, nausea -tremors Ths list may not describe all possible side effects. Call your doctor for medical advice about side effects. You may report side effects to FDA at 1-800-FDA-1088. This list may not describe all possible side effects. Call your doctor for medical advice about side effects. You may report side effects to FDA at 1-800-FDA-1088. Where should I keep my medicine? Keep out of reach of children. Store at room temperature between 15 and 30 degrees C (59 and 86 degrees F). Throw away any unused medicine after the expiration date. NOTE: This sheet is a summary. It may not cover all possible information. If you have questions about this medicine, talk to your doctor, pharmacist, or health care provider.  2018 Elsevier/Gold Standard (2015-08-04 15:26:08) Ethinyl Estradiol; Norelgestromin skin patches What is this medicine? ETHINYL ESTRADIOL;NORELGESTROMIN (ETH in il es tra DYE ole; nor el JES troe min) skin patch is used as a contraceptive (birth control method). This medicine combines two types of female hormones, an estrogen and a progestin. This patch is used to prevent ovulation and pregnancy. This medicine may be used for other purposes; ask your health care provider or pharmacist if you have questions. COMMON BRAND NAME(S): Ortho Christianne BorrowEvra, Xulane What should I tell my health care provider before I take this medicine? They need to know if you have or ever had any of these conditions: -abnormal vaginal bleeding -blood vessel disease or blood clots -breast, cervical, endometrial, ovarian, liver, or uterine cancer -diabetes -gallbladder disease -heart disease or recent heart attack -high blood pressure -high cholesterol -kidney disease -liver disease -migraine headaches -stroke -systemic lupus erythematosus (SLE) -tobacco smoker -an unusual or allergic reaction to  estrogens, progestins, other medicines, foods, dyes, or preservatives -pregnant or trying to get  pregnant -breast-feeding How should I use this medicine? This patch is applied to the skin. Follow the directions on the prescription label. Apply to clean, dry, healthy skin on the buttock, abdomen, upper outer arm or upper torso, in a place where it will not be rubbed by tight clothing. Do not use lotions or other cosmetics on the site where the patch will go. Press the patch firmly in place for 10 seconds to ensure good contact with the skin. Change the patch every 7 days on the same day of the week for 3 weeks. You will then have a break from the patch for 1 week, after which you will apply a new patch. Do not use your medicine more often than directed. Contact your pediatrician regarding the use of this medicine in children. Special care may be needed. This medicine has been used in female children who have started having menstrual periods. A patient package insert for the product will be given with each prescription and refill. Read this sheet carefully each time. The sheet may change frequently. Overdosage: If you think you have taken too much of this medicine contact a poison control center or emergency room at once. NOTE: This medicine is only for you. Do not share this medicine with others. What if I miss a dose? You will need to replace your patch once a week as directed. If your patch is lost or falls off, contact your health care professional for advice. You may need to use another form of birth control if your patch has been off for more than 1 day. What may interact with this medicine? Do not take this medicine with the following medication: -dasabuvir; ombitasvir; paritaprevir; ritonavir -ombitasvir; paritaprevir; ritonavir This medicine may also interact with the following medications: -acetaminophen -antibiotics or medicines for infections, especially rifampin, rifabutin, rifapentine,  and griseofulvin, and possibly penicillins or tetracyclines -aprepitant -ascorbic acid (vitamin C) -atorvastatin -barbiturate medicines, such as phenobarbital -bosentan -carbamazepine -caffeine -clofibrate -cyclosporine -dantrolene -doxercalciferol -felbamate -grapefruit juice -hydrocortisone -medicines for anxiety or sleeping problems, such as diazepam or temazepam -medicines for diabetes, including pioglitazone -modafinil -mycophenolate -nefazodone -oxcarbazepine -phenytoin -prednisolone -ritonavir or other medicines for HIV infection or AIDS -rosuvastatin -selegiline -soy isoflavones supplements -St. John's wort -tamoxifen or raloxifene -theophylline -thyroid hormones -topiramate -warfarin This list may not describe all possible interactions. Give your health care provider a list of all the medicines, herbs, non-prescription drugs, or dietary supplements you use. Also tell them if you smoke, drink alcohol, or use illegal drugs. Some items may interact with your medicine. What should I watch for while using this medicine? Visit your doctor or health care professional for regular checks on your progress. You will need a regular breast and pelvic exam and Pap smear while on this medicine. Use an additional method of contraception during the first cycle that you use this patch. If you have any reason to think you are pregnant, stop using this medicine right away and contact your doctor or health care professional. If you are using this medicine for hormone related problems, it may take several cycles of use to see improvement in your condition. Smoking increases the risk of getting a blood clot or having a stroke while you are using hormonal birth control, especially if you are more than 20 years old. You are strongly advised not to smoke. This medicine can make your body retain fluid, making your fingers, hands, or ankles swell. Your blood pressure can go up. Contact your  doctor or health care  professional if you feel you are retaining fluid. This medicine can make you more sensitive to the sun. Keep out of the sun. If you cannot avoid being in the sun, wear protective clothing and use sunscreen. Do not use sun lamps or tanning beds/booths. If you wear contact lenses and notice visual changes, or if the lenses begin to feel uncomfortable, consult your eye care specialist. In some women, tenderness, swelling, or minor bleeding of the gums may occur. Notify your dentist if this happens. Brushing and flossing your teeth regularly may help limit this. See your dentist regularly and inform your dentist of the medicines you are taking. If you are going to have elective surgery or a MRI, you may need to stop using this medicine before the surgery or MRI. Consult your health care professional for advice. This medicine does not protect you against HIV infection (AIDS) or any other sexually transmitted diseases. What side effects may I notice from receiving this medicine? Side effects that you should report to your doctor or health care professional as soon as possible: -breast tissue changes or discharge -changes in vaginal bleeding during your period or between your periods -chest pain -coughing up blood -dizziness or fainting spells -headaches or migraines -leg, arm or groin pain -severe or sudden headaches -stomach pain (severe) -sudden shortness of breath -sudden loss of coordination, especially on one side of the body -speech problems -symptoms of vaginal infection like itching, irritation or unusual discharge -tenderness in the upper abdomen -vomiting -weakness or numbness in the arms or legs, especially on one side of the body -yellowing of the eyes or skin Side effects that usually do not require medical attention (report to your doctor or health care professional if they continue or are bothersome): -breakthrough bleeding and spotting that continues beyond  the 3 initial cycles of pills -breast tenderness -mood changes, anxiety, depression, frustration, anger, or emotional outbursts -increased sensitivity to sun or ultraviolet light -nausea -skin rash, acne, or brown spots on the skin -weight gain (slight) This list may not describe all possible side effects. Call your doctor for medical advice about side effects. You may report side effects to FDA at 1-800-FDA-1088. Where should I keep my medicine? Keep out of the reach of children. Store at room temperature between 15 and 30 degrees C (59 and 86 degrees F). Keep the patch in its pouch until time of use. Throw away any unused medicine after the expiration date. Dispose of used patches properly. Since a used patch may still contain active hormones, fold the patch in half so that it sticks to itself prior to disposal. Throw away in a place where children or pets cannot reach. NOTE: This sheet is a summary. It may not cover all possible information. If you have questions about this medicine, talk to your doctor, pharmacist, or health care provider.  2018 Elsevier/Gold Standard (2015-11-12 07:59:03)

## 2016-08-24 ENCOUNTER — Encounter: Payer: Self-pay | Admitting: Certified Nurse Midwife

## 2016-08-24 DIAGNOSIS — Z8659 Personal history of other mental and behavioral disorders: Secondary | ICD-10-CM | POA: Insufficient documentation

## 2016-08-24 DIAGNOSIS — Z8759 Personal history of other complications of pregnancy, childbirth and the puerperium: Secondary | ICD-10-CM

## 2016-08-24 NOTE — Progress Notes (Signed)
GYN ENCOUNTER NOTE  Subjective:       [name] [name] is a 21 y.o. G61P1001 female here for medication management.   She started Lexapro 10 mg on  07/18/2016 as treatment for postpartum depression. [name] [name] states that she is not feeling "as bad" and would like to continue the medication at the current dose. Denies suicidal and homicidal ideations.   She reports she juggling work and motherhood to the best of her capabilites. [name] [name] would like to start school in August and plans on moving out of her parents' house at that time.   She denies any "drama" with the mothers of her FOB's other children.   She does request a different form of pregnancy prevention since she is having difficulty remembering to take a pill at the same time every day. She is two (2) pills into her current pack.   Denies difficulty breathing or respiratory distress, chest pain, abdominal pain, unexplained vaginal bleeding, and leg pain or swelling.    Gynecologic History  Patient's last menstrual period was 07/22/2016.   Contraception: OCP (estrogen/progesterone)   Last Pap: N/A.   Obstetric History  OB History  Gravida Para Term Preterm AB Living  1 1 1     1   SAB TAB Ectopic Multiple Live Births          1    # Outcome Date GA Lbr Len/2nd Weight Sex Delivery Anes PTL Lv  1 Term 06/06/16 [redacted]w[redacted]d   M Vag-Spont EPI  LIV      Past Medical History:  Diagnosis Date  . Abdominal pain   . Anal fissure   . Asthma   . Constipation   . H/O chlamydia infection 10/30/2015  . Headache     Past Surgical History:  Procedure Laterality Date  . KNEE SURGERY Left 2014, 2015    Current Outpatient Prescriptions on File Prior to Visit  Medication Sig Dispense Refill  . albuterol (PROVENTIL HFA;VENTOLIN HFA) 108 (90 Base) MCG/ACT inhaler Inhale 1-2 puffs into the lungs every 4 (four) hours as needed for wheezing or shortness of breath. 1 Inhaler 3  . ferrous sulfate 325 (65 FE) MG tablet Take 1 tablet (325 mg total) by mouth  2 (two) times daily with a meal. 60 tablet 3  . norethindrone-ethinyl estradiol (JUNEL FE,GILDESS FE,LOESTRIN FE) 1-20 MG-MCG tablet Take 1 tablet by mouth daily. 1 Package 11   No current facility-administered medications on file prior to visit.     Allergies  Allergen Reactions  . Ibuprofen Nausea Only  . Naproxen Itching and Nausea Only    Social History   Social History  . Marital status: Single    Spouse name: N/A  . Number of children: N/A  . Years of education: N/A   Occupational History  . Not on file.   Social History Main Topics  . Smoking status: Never Smoker  . Smokeless tobacco: Never Used  . Alcohol use No  . Drug use: No  . Sexual activity: Not Currently    Partners: Male    Birth control/ protection: Condom, Pill   Other Topics Concern  . Not on file   Social History Narrative  . No narrative on file    Family History  Problem Relation Age of Onset  . Cancer Sister   . Seizures Sister   . Migraines Mother   . Diabetes Paternal Grandmother   . Celiac disease Neg Hx   . Cholelithiasis Neg Hx   . Ulcers Neg Hx  The following portions of the patient's history were reviewed and updated as appropriate: allergies, current medications, past family history, past medical history, past social history, past surgical history and problem list.  Review of Systems  Review of Systems - Negative except as noted above.  History obtained from the patient.  Objective:   BP 129/64 (BP Location: Left Arm, Patient Position: Sitting, Cuff Size: Normal)   Pulse 80   Ht 5\' 2"  (1.575 m)   Wt 172 lb (78 kg)   LMP 07/22/2016   Breastfeeding? No   BMI 31.46 kg/m    Alert and oriented, no apparent distress  Physical exam: not indicated.  Assessment:   1. Anxiety and depression  - escitalopram (LEXAPRO) 10 MG tablet; Take 1 tablet (10 mg total) by mouth daily.  Dispense: 30 tablet; Refill: 5  2. Encounter for initial prescription of transdermal patch  hormonal contraceptive device  - norelgestromin-ethinyl estradiol (ORTHO EVRA) 150-35 MCG/24HR transdermal patch; Place 1 patch onto the skin once a week.  Dispense: 3 patch; Refill: 12  Plan:   Continue Lexapro as prescribed, see orders.   Rx Burr MedicoXulane, see orders.   Reviewed red flag symptoms and when to call.   RTC as needed.    Gunnar BullaJenkins Michelle Lucas Exline, CNM

## 2016-09-24 ENCOUNTER — Encounter: Payer: Self-pay | Admitting: Certified Nurse Midwife

## 2016-10-14 DIAGNOSIS — R1010 Upper abdominal pain, unspecified: Secondary | ICD-10-CM | POA: Diagnosis not present

## 2016-10-27 ENCOUNTER — Encounter: Payer: Self-pay | Admitting: Certified Nurse Midwife

## 2016-10-28 ENCOUNTER — Encounter: Payer: Self-pay | Admitting: Certified Nurse Midwife

## 2016-10-28 ENCOUNTER — Ambulatory Visit (INDEPENDENT_AMBULATORY_CARE_PROVIDER_SITE_OTHER): Payer: BLUE CROSS/BLUE SHIELD | Admitting: Certified Nurse Midwife

## 2016-10-28 DIAGNOSIS — N926 Irregular menstruation, unspecified: Secondary | ICD-10-CM | POA: Diagnosis not present

## 2016-10-28 DIAGNOSIS — Z113 Encounter for screening for infections with a predominantly sexual mode of transmission: Secondary | ICD-10-CM

## 2016-10-28 DIAGNOSIS — Z1331 Encounter for screening for depression: Secondary | ICD-10-CM

## 2016-10-28 DIAGNOSIS — Z1389 Encounter for screening for other disorder: Secondary | ICD-10-CM

## 2016-10-28 LAB — POCT URINE PREGNANCY: Preg Test, Ur: NEGATIVE

## 2016-10-28 NOTE — Progress Notes (Signed)
Patient ID: [name] [name], female   DOB: 06/13/1995, 21 y.o.   MRN: [id number] B/P-116/68 P-80 WT.166.6 LB LMP-09/25/2016 Took lexapro about 1 month and feels like she doesn't need it anymore.

## 2016-10-28 NOTE — Patient Instructions (Signed)

## 2016-10-30 LAB — CHLAMYDIA/GONOCOCCUS/TRICHOMONAS, NAA
CHLAMYDIA BY NAA: NEGATIVE
Gonococcus by NAA: NEGATIVE
Trich vag by NAA: NEGATIVE

## 2016-11-02 NOTE — Progress Notes (Signed)
GYN ENCOUNTER NOTE  Subjective:       [name] [name] is a 21 y.o. G85P1001 female here for STI screening and evaluation of missed menses. Patient's on again, off again boyfriend has another girl friend and a baby on the way. She would like to be screened for STIs.   Denies difficulty breathing or respiratory distress, chest pain, abdominal pain, vaginal bleeding, and leg pain or swelling.    Gynecologic History  LMP: 09/25/2016.   Contraception: Xulane transdermal patch   Last Pap: N/A.   Obstetric History  OB History  Gravida Para Term Preterm AB Living  1 1 1     1   SAB TAB Ectopic Multiple Live Births          1    # Outcome Date GA Lbr Len/2nd Weight Sex Delivery Anes PTL Lv  1 Term 06/06/16 [redacted]w[redacted]d   M Vag-Spont EPI  LIV      Past Medical History:  Diagnosis Date  . Abdominal pain   . Anal fissure   . Asthma   . Constipation   . H/O chlamydia infection 10/30/2015  . Headache     Past Surgical History:  Procedure Laterality Date  . KNEE SURGERY Left 2014, 2015    Current Outpatient Prescriptions on File Prior to Visit  Medication Sig Dispense Refill  . albuterol (PROVENTIL HFA;VENTOLIN HFA) 108 (90 Base) MCG/ACT inhaler Inhale 1-2 puffs into the lungs every 4 (four) hours as needed for wheezing or shortness of breath. 1 Inhaler 3  . escitalopram (LEXAPRO) 10 MG tablet Take 1 tablet (10 mg total) by mouth daily. (Patient not taking: Reported on 10/28/2016) 30 tablet 5  . ferrous sulfate 325 (65 FE) MG tablet Take 1 tablet (325 mg total) by mouth 2 (two) times daily with a meal. (Patient not taking: Reported on 10/28/2016) 60 tablet 3  . norelgestromin-ethinyl estradiol (ORTHO EVRA) 150-35 MCG/24HR transdermal patch Place 1 patch onto the skin once a week. 3 patch 12   No current facility-administered medications on file prior to visit.     Allergies  Allergen Reactions  . Ibuprofen Nausea Only  . Naproxen Itching and Nausea Only    Social History   Social  History  . Marital status: Single    Spouse name: N/A  . Number of children: N/A  . Years of education: N/A   Occupational History  . Not on file.   Social History Main Topics  . Smoking status: Never Smoker  . Smokeless tobacco: Never Used  . Alcohol use No  . Drug use: No  . Sexual activity: Not Currently    Partners: Male    Birth control/ protection: Xulane   Other Topics Concern  . Not on file   Social History Narrative  . No narrative on file    Family History  Problem Relation Age of Onset  . Cancer Sister   . Seizures Sister   . Migraines Mother   . Diabetes Paternal Grandmother   . Celiac disease Neg Hx   . Cholelithiasis Neg Hx   . Ulcers Neg Hx     The following portions of the patient's history were reviewed and updated as appropriate: allergies, current medications, past family history, past medical history, past social history, past surgical history and problem list.  Review of Systems  Review of Systems - Negative except as noted above.  History obtained from the patient.   Objective:   BP: 116/68, Pulse: 80, Weight: 166.6 pounds.  General: Alert and oriented x 4, no apparent distress.   Physical exam: not indicated.  UPT negative.   Depression screen [facility] 2/9 10/28/2016  Decreased Interest 0  Down, Depressed, Hopeless 0  PHQ - 2 Score 0  Altered sleeping 0  Tired, decreased energy 1  Change in appetite 1  Feeling bad or failure about yourself  0  Trouble concentrating 0  Moving slowly or fidgety/restless 0  Suicidal thoughts 0  PHQ-9 Score 2  Difficult doing work/chores Not difficult at all    Assessment:   1. Routine screening for STI (sexually transmitted infection)  - Chlamydia/Gonococcus/Trichomonas, NAA  2. Missed menses  - POCT urine pregnancy  3. Depression screening  Plan:   Discussed STI screening options. Patient would like to be checked for chlamydia, gonorrhea and trich only.   NuSwab self collected by  patient. Will contact via MyChart with results.   Education regarding STI prevention.   Reviewed red flag symptoms and when to call.   RTC as needed.    [name], CNM

## 2016-12-15 ENCOUNTER — Ambulatory Visit (INDEPENDENT_AMBULATORY_CARE_PROVIDER_SITE_OTHER): Payer: BLUE CROSS/BLUE SHIELD | Admitting: Certified Nurse Midwife

## 2016-12-15 ENCOUNTER — Encounter: Payer: Self-pay | Admitting: Certified Nurse Midwife

## 2016-12-15 VITALS — BP 109/71 | HR 87 | Ht 63.0 in | Wt 166.1 lb

## 2016-12-15 DIAGNOSIS — O219 Vomiting of pregnancy, unspecified: Secondary | ICD-10-CM | POA: Diagnosis not present

## 2016-12-15 DIAGNOSIS — N926 Irregular menstruation, unspecified: Secondary | ICD-10-CM

## 2016-12-15 DIAGNOSIS — Z3687 Encounter for antenatal screening for uncertain dates: Secondary | ICD-10-CM | POA: Diagnosis not present

## 2016-12-15 LAB — POCT URINE PREGNANCY: Preg Test, Ur: POSITIVE — AB

## 2016-12-15 MED ORDER — DOXYLAMINE-PYRIDOXINE 10-10 MG PO TBEC: 10.0000 mg | DELAYED_RELEASE_TABLET | Freq: Every day | ORAL | 1 refills | Status: DC

## 2016-12-15 NOTE — Patient Instructions (Signed)
Common Medications Safe in Pregnancy  Acne:      Constipation:  Benzoyl Peroxide     Colace  Clindamycin      Dulcolax Suppository  Topica Erythromycin     Fibercon  Salicylic Acid      Metamucil         Miralax AVOID:        Senakot   Accutane    Cough:  Retin-A       Cough Drops  Tetracycline      Phenergan w/ Codeine if Rx  Minocycline      Robitussin (Plain & DM)  Antibiotics:     Crabs/Lice:  Ceclor       RID  Cephalosporins    AVOID:  E-Mycins      Kwell  Keflex  Macrobid/Macrodantin   Diarrhea:  Penicillin      Kao-Pectate  Zithromax      Imodium AD         PUSH FLUIDS AVOID:       Cipro     Fever:  Tetracycline      Tylenol (Regular or Extra  Minocycline       Strength)  Levaquin      Extra Strength-Do not          Exceed 8 tabs/24 hrs Caffeine:        '200mg'$ /day (equiv. To 1 cup of coffee or  approx. 3 12 oz sodas)         Gas: Cold/Hayfever:       Gas-X  Benadryl      Mylicon  Claritin       Phazyme  **Claritin-D        Chlor-Trimeton    Headaches:  Dimetapp      ASA-Free Excedrin  Drixoral-Non-Drowsy     Cold Compress  Mucinex (Guaifenasin)     Tylenol (Regular or Extra  Sudafed/Sudafed-12 Hour     Strength)  **Sudafed PE Pseudoephedrine   Tylenol Cold & Sinus     Vicks Vapor Rub  Zyrtec  **AVOID if Problems With Blood Pressure         Heartburn: Avoid lying down for at least 1 hour after meals  Aciphex      Maalox     Rash:  Milk of Magnesia     Benadryl    Mylanta       1% Hydrocortisone Cream  Pepcid  Pepcid Complete   Sleep Aids:  Prevacid      Ambien   Prilosec       Benadryl  Rolaids       Chamomile Tea  Tums (Limit 4/day)     Unisom  Zantac       Tylenol PM         Warm milk-add vanilla or  Hemorrhoids:       Sugar for taste  Anusol/Anusol H.C.  (RX: Analapram 2.5%)  Sugar Substitutes:  Hydrocortisone OTC     Ok in moderation  Preparation H      Tucks        Vaseline lotion applied to tissue with  wiping    Herpes:     Throat:  Acyclovir      Oragel  Famvir  Valtrex     Vaccines:         Flu Shot Leg Cramps:       *Gardasil  Benadryl      Hepatitis A         Hepatitis  B Nasal Spray:       Pneumovax  Saline Nasal Spray     Polio Booster         Tetanus Nausea:       Tuberculosis test or PPD  Vitamin B6 25 mg TID   AVOID:    Dramamine      *Gardasil  Emetrol       Live Poliovirus  Ginger Root 250 mg QID    MMR (measles, mumps &  High Complex Carbs @ Bedtime    rebella)  Sea Bands-Accupressure    Varicella (Chickenpox)  Unisom 1/2 tab TID     *No known complications           If received before Pain:         Known pregnancy;   Darvocet       Resume series after  Lortab        Delivery  Percocet    Yeast:   Tramadol      Femstat  Tylenol 3      Gyne-lotrimin  Ultram       Monistat  Vicodin           MISC:         All Sunscreens           Hair Coloring/highlights          Insect Repellant's          (Including DEET)         Mystic Tans Morning Sickness Morning sickness is when you feel sick to your stomach (nauseous) during pregnancy. You may feel sick to your stomach and throw up (vomit). You may feel sick in the morning, but you can feel this way any time of day. Some women feel very sick to their stomach and cannot stop throwing up (hyperemesis gravidarum). Follow these instructions at home:  Only take medicines as told by your doctor.  Take multivitamins as told by your doctor. Taking multivitamins before getting pregnant can stop or lessen the harshness of morning sickness.  Eat dry toast or unsalted crackers before getting out of bed.  Eat 5 to 6 small meals a day.  Eat dry and bland foods like rice and baked potatoes.  Do not drink liquids with meals. Drink between meals.  Do not eat greasy, fatty, or spicy foods.  Have someone cook for you if the smell of food causes you to feel sick or throw up.  If you feel sick to your stomach after taking prenatal  vitamins, take them at night or with a snack.  Eat protein when you need a snack (nuts, yogurt, cheese).  Eat unsweetened gelatins for dessert.  Wear a bracelet used for sea sickness (acupressure wristband).  Go to a doctor that puts thin needles into certain body points (acupuncture) to improve how you feel.  Do not smoke.  Use a humidifier to keep the air in your house free of odors.  Get lots of fresh air. Contact a doctor if:  You need medicine to feel better.  You feel dizzy or lightheaded.  You are losing weight. Get help right away if:  You feel very sick to your stomach and cannot stop throwing up.  You pass out (faint). This information is not intended to replace advice given to you by your health care provider. Make sure you discuss any questions you have with your health care provider. Document Released: 04/10/2004 Document Revised: 08/09/2015 Document Reviewed: 08/18/2012 Elsevier Interactive Patient Education  2017 [place].

## 2016-12-15 NOTE — Progress Notes (Signed)
GYN ENCOUNTER NOTE  Subjective:       [name] [name] is a 21 y.o. G26P1001 female here for pregnancy confirmation.   Endorses nausea without vomiting, lower abdominal cramping, and breast tenderness.   Denies difficulty breathing or respiratory distress, chest pain, abdominal pain, vaginal bleeding, dysuria, and leg pain or swelling.    Gynecologic History  Patient's last menstrual period was 10/27/2016 (approximate).  Contraception: Ortho-Evra patches weekly; stopped going to pharmacy to pickup.   Last Pap: N/A.   Obstetric History  OB History  Gravida Para Term Preterm AB Living  SAB TAB Ectopic Multiple Live Births          1    # Outcome Date GA Lbr Len/2nd Weight Sex Delivery Anes PTL Lv  2 Current           1 Term 06/06/16 [redacted]w[redacted]d   M Vag-Spont EPI  LIV      Past Medical History:  Diagnosis Date  . Abdominal pain   . Anal fissure   . Asthma   . Constipation   . H/O chlamydia infection 10/30/2015  . Headache     Past Surgical History:  Procedure Laterality Date  . KNEE SURGERY Left 2014, 2015    No current outpatient prescriptions on file prior to visit.   No current facility-administered medications on file prior to visit.     Allergies  Allergen Reactions  . Ibuprofen Nausea Only  . Naproxen Itching and Nausea Only    Social History   Social History  . Marital status: Single    Spouse name: N/A  . Number of children: N/A  . Years of education: N/A   Occupational History  . Not on file.   Social History Main Topics  . Smoking status: Never Smoker  . Smokeless tobacco: Never Used  . Alcohol use No  . Drug use: No  . Sexual activity: Yes    Partners: Male   Other Topics Concern  . Not on file   Social History Narrative  . No narrative on file    Family History  Problem Relation Age of Onset  . Cancer Sister   . Seizures Sister   . Migraines Mother   . Diabetes Paternal Grandmother   . Celiac disease Neg Hx   .  Cholelithiasis Neg Hx   . Ulcers Neg Hx     The following portions of the patient's history were reviewed and updated as appropriate: allergies, current medications, past family history, past medical history, past social history, past surgical history and problem list.  Review of Systems  Review of Systems - Negative except as noted above.  History obtained from the patient.   Objective:   BP 109/71   Pulse 87   Ht  (1.6 m)   Wt 166 lb 1.6 oz (75.3 kg)   LMP 10/27/2016 (Approximate)   Breastfeeding? No   BMI 29.42 kg/m   Alert and oriented x 4, no apparent distress.   Positive UPT  Physical exam: not indicated.   Assessment:   1. Missed menses  - POCT urine pregnancy  2. Unsure of LMP (last menstrual period) as reason for ultrasound scan  - US OB Comp Less 14 Wks; Future - US OB Transvaginal; Future  3. Nausea and vomiting in pregnancy   Plan:   Rx: Diclegis, see orders.   Reviewed red flag symptoms and when to call.   RTC x 2 weeks  for dating scan and nurse intake  RTC x  5 weeks for NOB PE   [name], CNM

## 2016-12-20 ENCOUNTER — Other Ambulatory Visit: Payer: Self-pay | Admitting: Certified Nurse Midwife

## 2016-12-20 MED ORDER — CITRANATAL B-CALM 20-1 MG & 2 X 25 MG PO MISC: 1.0000 | Freq: Every day | ORAL | 3 refills | Status: DC

## 2016-12-20 NOTE — Progress Notes (Signed)
Diclegis declined by insurance at this time. Citranatal B-calm ordered.    [name], CNM

## 2016-12-24 ENCOUNTER — Telehealth: Payer: Self-pay | Admitting: Certified Nurse Midwife

## 2016-12-24 NOTE — Telephone Encounter (Signed)
Will do PA for pt

## 2016-12-24 NOTE — Telephone Encounter (Signed)
Patient is calling about the nausea medication and the insurance needs prior authorization - the [facility] pharmacy sent over a request and they haven't heard back from [place] - patient states that she needs her medicine asap   Please call

## 2016-12-29 ENCOUNTER — Ambulatory Visit (INDEPENDENT_AMBULATORY_CARE_PROVIDER_SITE_OTHER): Payer: BLUE CROSS/BLUE SHIELD | Admitting: Certified Nurse Midwife

## 2016-12-29 ENCOUNTER — Ambulatory Visit (INDEPENDENT_AMBULATORY_CARE_PROVIDER_SITE_OTHER): Payer: BLUE CROSS/BLUE SHIELD

## 2016-12-29 VITALS — BP 115/71 | HR 76 | Ht 63.0 in | Wt 166.1 lb

## 2016-12-29 DIAGNOSIS — Z1389 Encounter for screening for other disorder: Secondary | ICD-10-CM | POA: Diagnosis not present

## 2016-12-29 DIAGNOSIS — Z3687 Encounter for antenatal screening for uncertain dates: Secondary | ICD-10-CM

## 2016-12-29 DIAGNOSIS — Z3481 Encounter for supervision of other normal pregnancy, first trimester: Secondary | ICD-10-CM | POA: Diagnosis not present

## 2016-12-29 DIAGNOSIS — Z113 Encounter for screening for infections with a predominantly sexual mode of transmission: Secondary | ICD-10-CM | POA: Diagnosis not present

## 2016-12-29 LAB — POCT URINALYSIS DIPSTICK
Bilirubin, UA: NEGATIVE
Glucose, UA: NEGATIVE
Ketones, UA: NEGATIVE
LEUKOCYTES UA: NEGATIVE
NITRITE UA: NEGATIVE
PH UA: 7.5 (ref 5.0–8.0)
PROTEIN UA: NEGATIVE
Spec Grav, UA: <=1.005 — AB (ref 1.010–1.025)
UROBILINOGEN UA: 0.2 U/dL

## 2016-12-29 NOTE — Progress Notes (Signed)
I have reviewed the record and concur with patient management and plan.    Daxen Lanum Michelle Tjuana Vickrey, CNM [facility] 

## 2016-12-29 NOTE — Progress Notes (Signed)
[name] [name] presents for NOB nurse interview visit. Pregnancy confirmation done [facility]_____.  G- .2  P- .1 0 0 1 Pregnancy education material explained and given. _0__ cats in the home. NOB labs ordered. (TSH/HbgA1c due to Increased BMI), (sickle cell). HIV labs and Drug screen were explained optional and she did not decline. Drug screen ordered. PNV encouraged. Genetic screening options discussed. Genetic testing: Unsure.  Pt may discuss with provider. Pt. To follow up with provider in _4_ weeks for NOB physical.  All questions answered.

## 2016-12-30 LAB — ANTIBODY SCREEN: Antibody Screen: NEGATIVE

## 2016-12-30 LAB — CBC WITH DIFFERENTIAL/PLATELET
BASOS ABS: 0 10*3/uL (ref 0.0–0.2)
BASOS: 0 %
EOS (ABSOLUTE): 0.1 10*3/uL (ref 0.0–0.4)
EOS: 1 %
HEMATOCRIT: 36.8 % (ref 34.0–46.6)
HEMOGLOBIN: 12.1 g/dL (ref 11.1–15.9)
IMMATURE GRANS (ABS): 0 10*3/uL (ref 0.0–0.1)
Immature Granulocytes: 0 %
LYMPHS: 21 %
Lymphocytes Absolute: 1.8 10*3/uL (ref 0.7–3.1)
MCH: 25 pg — AB (ref 26.6–33.0)
MCHC: 32.9 g/dL (ref 31.5–35.7)
MCV: 76 fL — AB (ref 79–97)
MONOCYTES: 5 %
Monocytes Absolute: 0.4 10*3/uL (ref 0.1–0.9)
NEUTROS ABS: 6 10*3/uL (ref 1.4–7.0)
Neutrophils: 73 %
Platelets: 313 10*3/uL (ref 150–379)
RBC: 4.84 x10E6/uL (ref 3.77–5.28)
RDW: 16.3 % — ABNORMAL HIGH (ref 12.3–15.4)
WBC: 8.3 10*3/uL (ref 3.4–10.8)

## 2016-12-30 LAB — HIV ANTIBODY (ROUTINE TESTING W REFLEX): HIV Screen 4th Generation wRfx: NONREACTIVE

## 2016-12-30 LAB — HEPATITIS B SURFACE ANTIGEN: HEP B S AG: NEGATIVE

## 2016-12-30 LAB — RPR: RPR: NONREACTIVE

## 2016-12-30 LAB — RUBELLA SCREEN: RUBELLA: 6.48 {index} (ref >0.99)

## 2016-12-30 LAB — ABO

## 2016-12-30 LAB — VARICELLA ZOSTER ANTIBODY, IGG: VARICELLA: 341 {index} (ref >165)

## 2016-12-30 LAB — RH TYPE: Rh Factor: POSITIVE

## 2016-12-31 LAB — URINE CULTURE, OB REFLEX

## 2016-12-31 LAB — CULTURE, OB URINE

## 2017-01-02 LAB — MONITOR DRUG PROFILE 14(MW)
AMPHETAMINE SCREEN URINE: NEGATIVE ng/mL
BARBITURATE SCREEN URINE: NEGATIVE ng/mL
BENZODIAZEPINE SCREEN, URINE: NEGATIVE ng/mL
Buprenorphine, Urine: NEGATIVE ng/mL
Cocaine (Metab) Scrn, Ur: NEGATIVE ng/mL
Creatinine(Crt), U: 66.7 mg/dL (ref 20.0–300.0)
FENTANYL, URINE: NEGATIVE pg/mL
METHADONE SCREEN, URINE: NEGATIVE ng/mL
Meperidine Screen, Urine: NEGATIVE ng/mL
OXYCODONE+OXYMORPHONE UR QL SCN: NEGATIVE ng/mL
Opiate Scrn, Ur: NEGATIVE ng/mL
PH UR, DRUG SCRN: 6.8 (ref 4.5–8.9)
PHENCYCLIDINE QUANTITATIVE URINE: NEGATIVE ng/mL
Propoxyphene Scrn, Ur: NEGATIVE ng/mL
SPECIFIC GRAVITY: 1.009
Tramadol Screen, Urine: NEGATIVE ng/mL

## 2017-01-02 LAB — URINALYSIS, ROUTINE W REFLEX MICROSCOPIC
Bilirubin, UA: NEGATIVE
GLUCOSE, UA: NEGATIVE
Ketones, UA: NEGATIVE
Nitrite, UA: NEGATIVE
Protein, UA: NEGATIVE
Specific Gravity, UA: 1.009 (ref 1.005–1.030)
Urobilinogen, Ur: 0.2 mg/dL (ref 0.2–1.0)
pH, UA: 7 (ref 5.0–7.5)

## 2017-01-02 LAB — MICROSCOPIC EXAMINATION: CASTS: NONE SEEN /LPF

## 2017-01-02 LAB — NICOTINE SCREEN, URINE: COTININE UR QL SCN: NEGATIVE ng/mL

## 2017-01-02 LAB — CANNABINOID (GC/MS), URINE
CARBOXY THC UR: 34 ng/mL
Cannabinoid: POSITIVE — AB

## 2017-01-15 IMAGING — US US OB TRANSVAGINAL
1 series · 13 of 28 positions shown · non-contrast
Comparison: None.

CLINICAL DATA: Pregnant patient in first-trimester pregnancy with
vaginal bleeding for 1 day.



[Series 1: us ob transvaginal · 0.14mm/px · 13 of 101 slices shown]
[im 4/101]
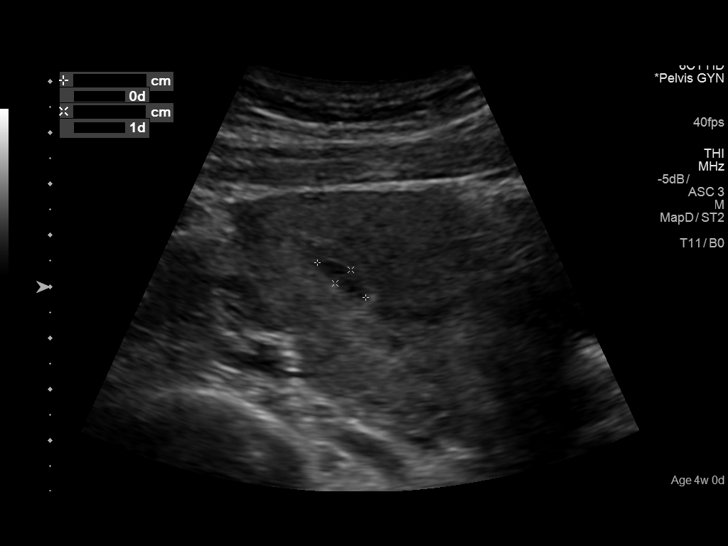
[im 12/101]
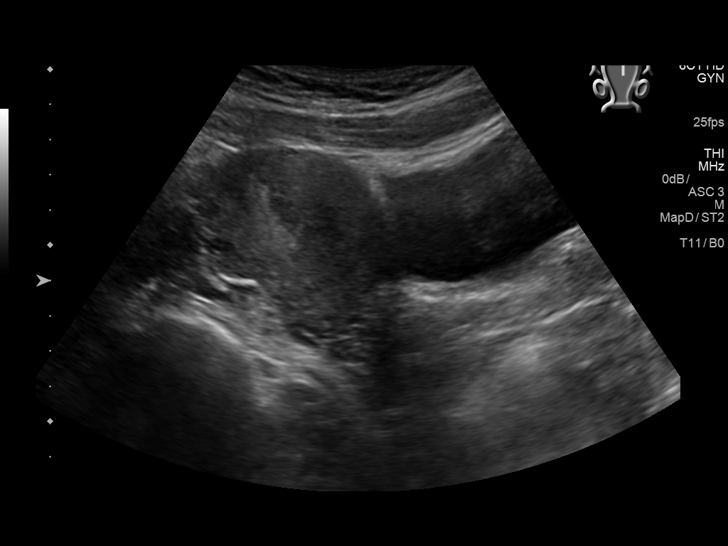
[im 19/101]
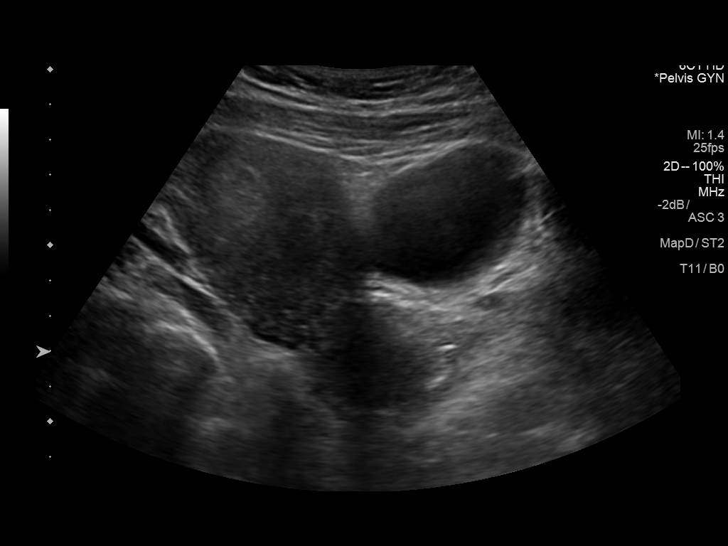
[im 26/101]
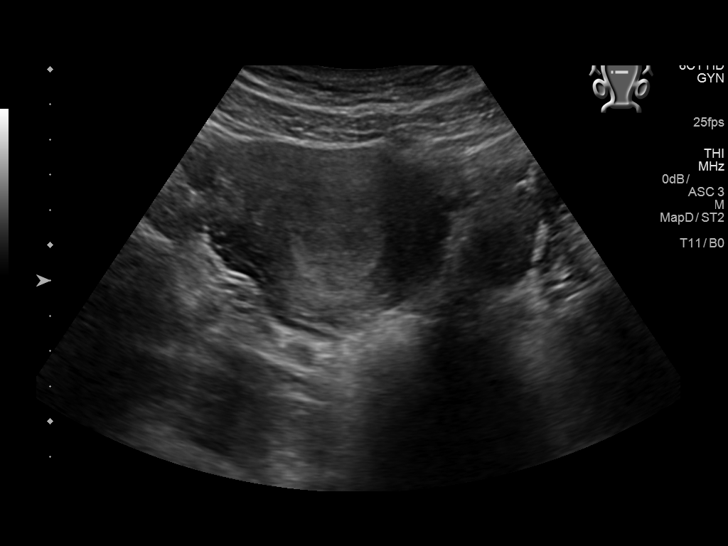
[im 34/101]
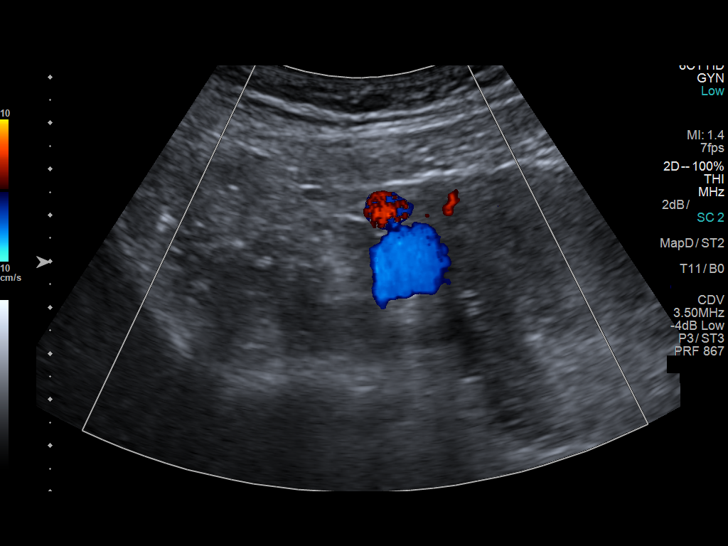
[im 41/101]
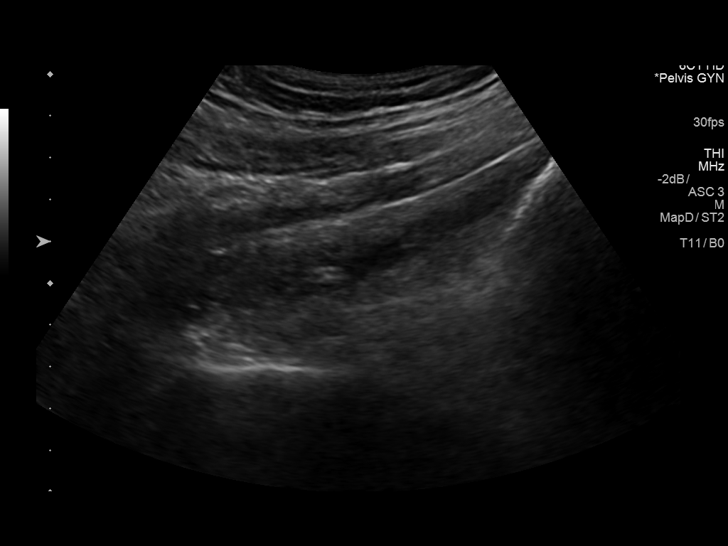
[im 52/101]
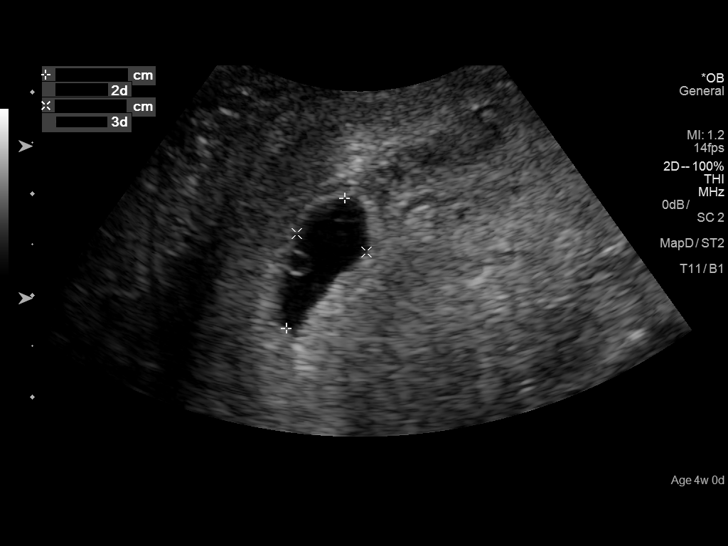
[im 60/101]
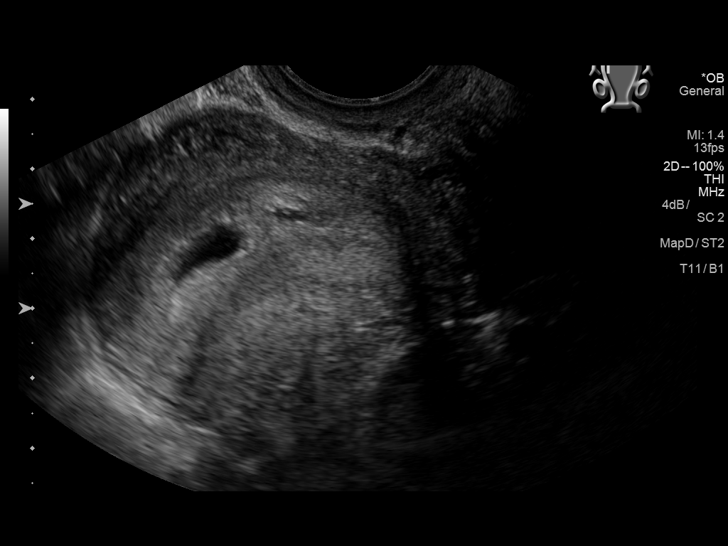
[im 67/101]
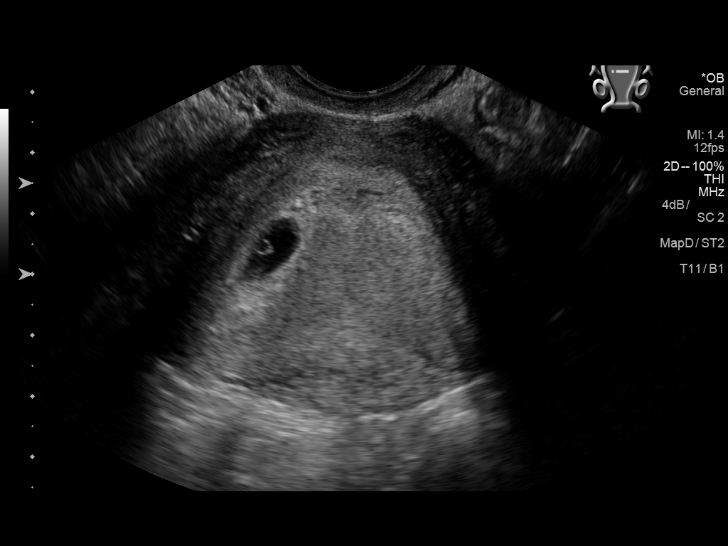
[im 75/101]
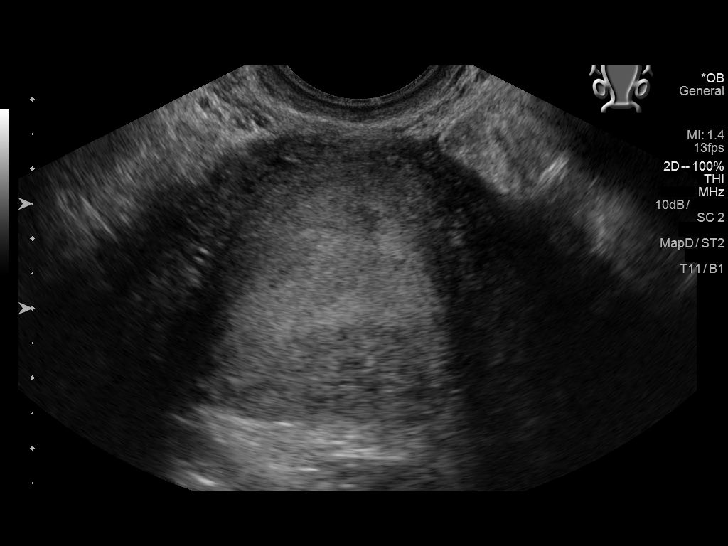
[im 82/101]
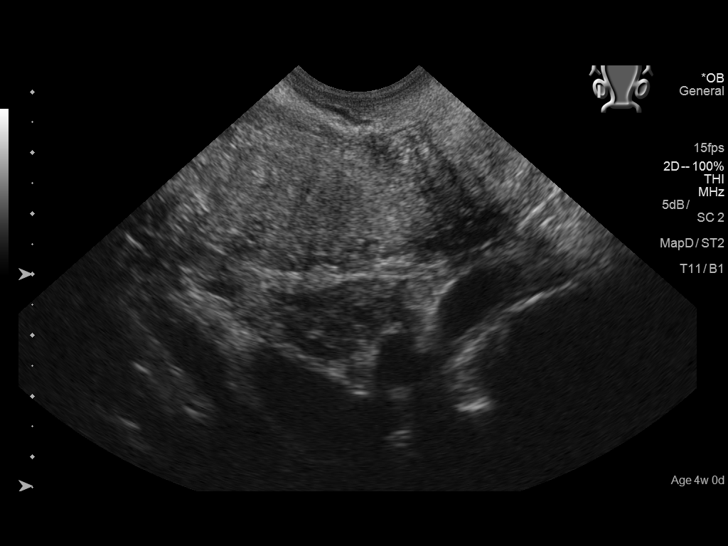
[im 89/101]
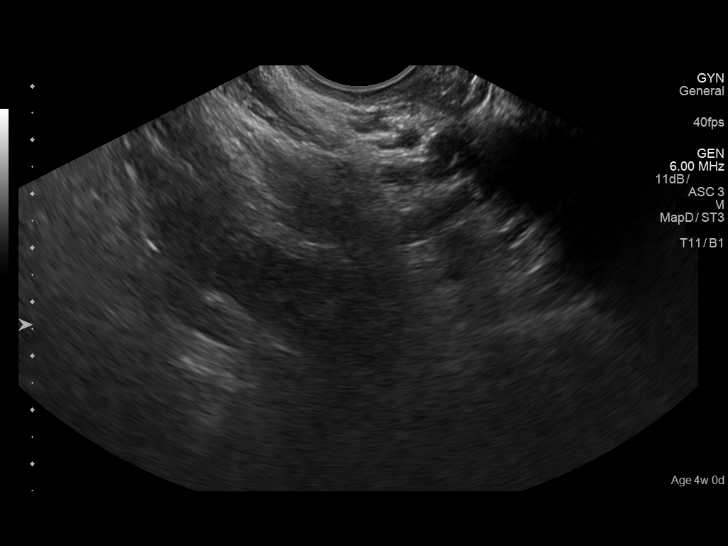
[im 97/101]
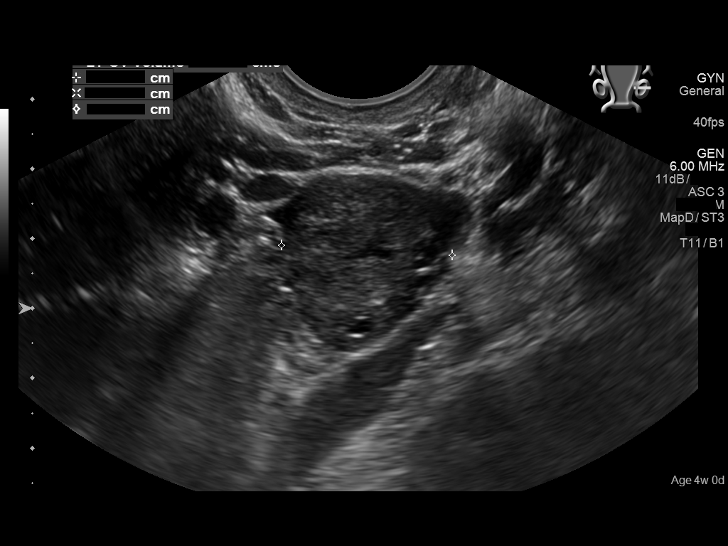

[13 of 28 positions shown; findings below may reference images not displayed]

FINDINGS: Intrauterine gestational sac: Single

Yolk sac:  Present.

Embryo:  Not visualized.

Cardiac Activity: Not visualized.

MSD: 8.4  mm   5 w   4  d

Subchorionic hemorrhage:  None visualized.

Maternal uterus/adnexae: The right ovary is normal measuring 2.3 x
1.2 x 3.4 cm with normal blood flow. No adnexal mass. The left ovary
measures 3.7 x 2.3 x 2.5 cm contains a corpus luteal cyst. There is
normal blood flow. Trace pelvic free fluid.

Pulsed Doppler evaluation of both ovaries demonstrates normal
appearing low-resistance arterial and venous waveforms.
IMPRESSION: 1. Probable early intrauterine gestational sac with yolk sac, but no
fetal pole or cardiac activity yet visualized. Recommend follow-up
quantitative B-HCG levels and follow-up US in 10 days to confirm and
assess viability. This recommendation follows SRU consensus
guidelines: Diagnostic Criteria for Nonviable Pregnancy Early in the
First Trimester. N Engl J Med 9058; [DATE].
2. Normal blood flow to both ovaries, no torsion. Corpus luteum on
the left.

## 2017-01-19 ENCOUNTER — Encounter: Payer: BLUE CROSS/BLUE SHIELD | Admitting: Certified Nurse Midwife

## 2017-01-19 NOTE — Progress Notes (Deleted)
NEW OB HISTORY AND PHYSICAL  SUBJECTIVE:       [name] [name] is a 21 y.o. G14P1001 female, Patient's last menstrual period was 10/27/2016 (approximate)., Estimated Date of Delivery: 08/04/17, [redacted]w[redacted]d, presents today for establishment of Prenatal Care. She has no unusual complaints and complains of {ros pregnancy complaints:313260}   Denies difficulty breathing or respiratory distress, chest pain, abdominal pain, vaginal bleeding, dysuria, and leg pain or swelling.    Gynecologic History  Patient's last menstrual period was 10/27/2016 (approximate).   Contraception: OCP (estrogen/progesterone)  Last Pap: N/A.  Obstetric History  OB History  Gravida Para Term Preterm AB Living  2 1 1     1   SAB TAB Ectopic Multiple Live Births          1    # Outcome Date GA Lbr Len/2nd Weight Sex Delivery Anes PTL Lv  2 Current           1 Term 06/06/16 [redacted]w[redacted]d   M Vag-Spont EPI  LIV      Past Medical History:  Diagnosis Date  . Abdominal pain   . Anal fissure   . Asthma   . Constipation   . H/O chlamydia infection 10/30/2015  . Headache     Past Surgical History:  Procedure Laterality Date  . KNEE SURGERY Left 2014, 2015    Current Outpatient Medications on File Prior to Visit  Medication Sig Dispense Refill  . albuterol (PROAIR HFA) 108 (90 Base) MCG/ACT inhaler Inhale into the lungs.    . Doxylamine-Pyridoxine 10-10 MG TBEC Take 10 mg by mouth daily. No more than 4 a day (Patient not taking: Reported on 12/29/2016) 60 tablet 1  . Prenatal Vit-Fe Fumarate-FA (PRENATAL MULTIVITAMIN) TABS tablet Take 1 tablet by mouth daily at 12 noon.     No current facility-administered medications on file prior to visit.     Allergies  Allergen Reactions  . Ibuprofen Nausea Only and Other (See Comments)    ibuprofen 800 mg - Vomiting and Shaking  . Naproxen Itching and Nausea Only    Social History   Socioeconomic History  . Marital status: Single    Spouse name: Not on file  . Number of  children: Not on file  . Years of education: Not on file  . Highest education level: Not on file  Social Needs  . Financial resource strain: Not on file  . Food insecurity - worry: Not on file  . Food insecurity - inability: Not on file  . Transportation needs - medical: Not on file  . Transportation needs - non-medical: Not on file  Occupational History  . Not on file  Tobacco Use  . Smoking status: Never Smoker  . Smokeless tobacco: Never Used  Substance and Sexual Activity  . Alcohol use: No  . Drug use: No  . Sexual activity: Yes    Partners: Male  Other Topics Concern  . Not on file  Social History Narrative  . Not on file    Family History  Problem Relation Age of Onset  . Cancer Sister   . Seizures Sister   . Migraines Mother   . Diabetes Paternal Grandmother   . Celiac disease Neg Hx   . Cholelithiasis Neg Hx   . Ulcers Neg Hx     The following portions of the patient's history were reviewed and updated as appropriate: allergies, current medications, past OB history, past medical history, past surgical history, past family history, past social history, and problem list.  OBJECTIVE:  LMP 10/27/2016 (Approximate)   Initial Physical Exam (New OB)  GENERAL APPEARANCE: {appearance:314449::"alert, well appearing"}  HEAD: {head:313264::"normocephalic, atraumatic"}  MOUTH: {pe mouth simple ob:314450::"mucous membranes moist, pharynx normal without lesions"}  THYROID: {pe neck ob:312768::"no thyromegaly or masses present"}  BREASTS: {pe breast simple:312769::"no masses noted, no significant tenderness, no palpable axillary nodes, no skin changes"}  LUNGS: {pe lungs ob:314451::"clear to auscultation, no wheezes, rales or rhonchi, symmetric air entry"}  HEART: {pe heart brief:310035::"regular rate and rhythm","no murmurs"}  ABDOMEN: {pe abdomen pregnant simple ob:313266::"soft, nontender, nondistended, no abnormal masses, no epigastric pain"}  EXTREMITIES:  {pe extremities ob:314458::"no redness or tenderness in the calves or thighs"}  SKIN: {pe skin brief ob:314459::"normal coloration and turgor, no rashes"}  LYMPH NODES: {pe lymph nodes brief:314538::"no adenopathy palpable"}  NEUROLOGIC: {pe neurologic exam ob:312789::"alert, oriented, normal speech, no focal findings or movement disorder noted"}  PELVIC EXAM {pe pelvic exam prenatal obgyn:314539}  ASSESSMENT: {pregnancy state assessment:313271::"Normal pregnancy"}  PLAN: Prenatal care New OB counseling: The patient has been given an overview regarding routine prenatal care. Recommendations regarding diet, weight gain, and exercise in pregnancy were given. Prenatal testing, optional genetic testing, and ultrasound use in pregnancy were reviewed.  Benefits of Breast Feeding were discussed. The patient is encouraged to consider nursing her baby post partum. See orders   Gunnar BullaJenkins Michelle Revella Shelton, CNM

## 2017-01-22 ENCOUNTER — Encounter: Payer: Self-pay | Admitting: Certified Nurse Midwife

## 2017-01-22 ENCOUNTER — Ambulatory Visit (INDEPENDENT_AMBULATORY_CARE_PROVIDER_SITE_OTHER): Payer: BLUE CROSS/BLUE SHIELD | Admitting: Certified Nurse Midwife

## 2017-01-22 ENCOUNTER — Other Ambulatory Visit: Payer: Self-pay

## 2017-01-22 VITALS — BP 115/68 | HR 81 | Wt 161.1 lb

## 2017-01-22 DIAGNOSIS — Z3482 Encounter for supervision of other normal pregnancy, second trimester: Secondary | ICD-10-CM

## 2017-01-22 DIAGNOSIS — O09892 Supervision of other high risk pregnancies, second trimester: Secondary | ICD-10-CM

## 2017-01-22 DIAGNOSIS — R51 Headache: Secondary | ICD-10-CM

## 2017-01-22 DIAGNOSIS — O26891 Other specified pregnancy related conditions, first trimester: Secondary | ICD-10-CM

## 2017-01-22 LAB — POCT URINALYSIS DIPSTICK
Bilirubin, UA: NEGATIVE
Blood, UA: NEGATIVE
Glucose, UA: NEGATIVE
KETONES UA: NEGATIVE
Nitrite, UA: NEGATIVE
PH UA: 6 (ref 5.0–8.0)
SPEC GRAV UA: 1.01 (ref 1.010–1.025)
Urobilinogen, UA: 0.2 E.U./dL

## 2017-01-22 MED ORDER — BUTALBITAL-APAP-CAFFEINE 50-325-40 MG PO CAPS: 1.0000 | ORAL_CAPSULE | Freq: Four times a day (QID) | ORAL | 3 refills | Status: DC | PRN

## 2017-01-22 MED ORDER — MAGNESIUM OXIDE 400 (241.3 MG) MG PO TABS: 400.0000 mg | ORAL_TABLET | Freq: Every day | ORAL | 0 refills | Status: DC

## 2017-01-22 NOTE — Patient Instructions (Signed)
Common Medications Safe in Pregnancy  Acne:      Constipation:  Benzoyl Peroxide     Colace  Clindamycin      Dulcolax Suppository  Topica Erythromycin     Fibercon  Salicylic Acid      Metamucil         Miralax AVOID:        Senakot   Accutane    Cough:  Retin-A       Cough Drops  Tetracycline      Phenergan w/ Codeine if Rx  Minocycline      Robitussin (Plain & DM)  Antibiotics:     Crabs/Lice:  Ceclor       RID  Cephalosporins    AVOID:  E-Mycins      Kwell  Keflex  Macrobid/Macrodantin   Diarrhea:  Penicillin      Kao-Pectate  Zithromax      Imodium AD         PUSH FLUIDS AVOID:       Cipro     Fever:  Tetracycline      Tylenol (Regular or Extra  Minocycline       Strength)  Levaquin      Extra Strength-Do not          Exceed 8 tabs/24 hrs Caffeine:        <200mg/day (equiv. To 1 cup of coffee or  approx. 3 12 oz sodas)         Gas: Cold/Hayfever:       Gas-X  Benadryl      Mylicon  Claritin       Phazyme  **Claritin-D        Chlor-Trimeton    Headaches:  Dimetapp      ASA-Free Excedrin  Drixoral-Non-Drowsy     Cold Compress  Mucinex (Guaifenasin)     Tylenol (Regular or Extra  Sudafed/Sudafed-12 Hour     Strength)  **Sudafed PE Pseudoephedrine   Tylenol Cold & Sinus     Vicks Vapor Rub  Zyrtec  **AVOID if Problems With Blood Pressure         Heartburn: Avoid lying down for at least 1 hour after meals  Aciphex      Maalox     Rash:  Milk of Magnesia     Benadryl    Mylanta       1% Hydrocortisone Cream  Pepcid  Pepcid Complete   Sleep Aids:  Prevacid      Ambien   Prilosec       Benadryl  Rolaids       Chamomile Tea  Tums (Limit 4/day)     Unisom  Zantac       Tylenol PM         Warm milk-add vanilla or  Hemorrhoids:       Sugar for taste  Anusol/Anusol H.C.  (RX: Analapram 2.5%)  Sugar Substitutes:  Hydrocortisone OTC     Ok in moderation  Preparation H      Tucks        Vaseline lotion applied to tissue with  wiping    Herpes:     Throat:  Acyclovir      Oragel  Famvir  Valtrex     Vaccines:         Flu Shot Leg Cramps:       *Gardasil  Benadryl      Hepatitis A         Hepatitis B Nasal Spray:         Pneumovax  Saline Nasal Spray     Polio Booster         Tetanus Nausea:       Tuberculosis test or PPD  Vitamin B6 25 mg TID   AVOID:    Dramamine      *Gardasil  Emetrol       Live Poliovirus  Ginger Root 250 mg QID    MMR (measles, mumps &  High Complex Carbs @ Bedtime    rebella)  Sea Bands-Accupressure    Varicella (Chickenpox)  Unisom 1/2 tab TID     *No known complications           If received before Pain:         Known pregnancy;   Darvocet       Resume series after  Lortab        Delivery  Percocet    Yeast:   Tramadol      Femstat  Tylenol 3      Gyne-lotrimin  Ultram       Monistat  Vicodin           MISC:         All Sunscreens           Hair Coloring/highlights          Insect Repellant's          (Including DEET)         Mystic Tans Second Trimester of Pregnancy The second trimester is from week 13 through week 28, month 4 through 6. This is often the time in pregnancy that you feel your best. Often times, morning sickness has lessened or quit. You may have more energy, and you may get hungry more often. Your unborn baby (fetus) is growing rapidly. At the end of the sixth month, he or she is about 9 inches long and weighs about 1 pounds. You will likely feel the baby move (quickening) between 18 and 20 weeks of pregnancy. Follow these instructions at home:  Avoid all smoking, herbs, and alcohol. Avoid drugs not approved by your doctor.  Do not use any tobacco products, including cigarettes, chewing tobacco, and electronic cigarettes. If you need help quitting, ask your doctor. You may get counseling or other support to help you quit.  Only take medicine as told by your doctor. Some medicines are safe and some are not during pregnancy.  Exercise only as told by your  doctor. Stop exercising if you start having cramps.  Eat regular, healthy meals.  Wear a good support bra if your breasts are tender.  Do not use hot tubs, steam rooms, or saunas.  Wear your seat belt when driving.  Avoid raw meat, uncooked cheese, and liter boxes and soil used by cats.  Take your prenatal vitamins.  Take 1500-2000 milligrams of calcium daily starting at the 20th week of pregnancy until you deliver your baby.  Try taking medicine that helps you poop (stool softener) as needed, and if your doctor approves. Eat more fiber by eating fresh fruit, vegetables, and whole grains. Drink enough fluids to keep your pee (urine) clear or pale yellow.  Take warm water baths (sitz baths) to soothe pain or discomfort caused by hemorrhoids. Use hemorrhoid cream if your doctor approves.  If you have puffy, bulging veins (varicose veins), wear support hose. Raise (elevate) your feet for 15 minutes, 3-4 times a day. Limit salt in your diet.  Avoid heavy lifting, wear low heals, and sit up straight.  Rest with your legs raised if you have leg cramps or low back pain.  Visit your dentist if you have not gone during your pregnancy. Use a soft toothbrush to brush your teeth. Be gentle when you floss.  You can have sex (intercourse) unless your doctor tells you not to.  Go to your doctor visits. Get help if:  You feel dizzy.  You have mild cramps or pressure in your lower belly (abdomen).  You have a nagging pain in your belly area.  You continue to feel sick to your stomach (nauseous), throw up (vomit), or have watery poop (diarrhea).  You have bad smelling fluid coming from your vagina.  You have pain with peeing (urination). Get help right away if:  You have a fever.  You are leaking fluid from your vagina.  You have spotting or bleeding from your vagina.  You have severe belly cramping or pain.  You lose or gain weight rapidly.  You have trouble catching your  breath and have chest pain.  You notice sudden or extreme puffiness (swelling) of your face, hands, ankles, feet, or legs.  You have not felt the baby move in over an hour.  You have severe headaches that do not go away with medicine.  You have vision changes. This information is not intended to replace advice given to you by your health care provider. Make sure you discuss any questions you have with your health care provider. Document Released: 05/28/2009 Document Revised: 08/09/2015 Document Reviewed: 05/04/2012 Elsevier Interactive Patient Education  2017 [employer].

## 2017-01-23 DIAGNOSIS — O09892 Supervision of other high risk pregnancies, second trimester: Secondary | ICD-10-CM | POA: Insufficient documentation

## 2017-01-23 NOTE — Progress Notes (Signed)
NEW OB HISTORY AND PHYSICAL  SUBJECTIVE:       [name] [name] is a 21 y.o. [id number] female, Patient's last menstrual period was 10/27/2016 (approximate)., Estimated Date of Delivery: 08/04/17, 463w2d, presents today for establishment of Prenatal Care.  Reports intermittent lower abdominal cramping, headaches, nausea without vomiting and breast tenderness. Symptoms are resolving daily.   Denies difficulty breathing or respiratory distress, chest pain, abdominal pain, vaginal bleeding, and leg pain or swelling.    Gynecologic History  Patient's last menstrual period was 10/27/2016 (approximate).   Contraception: Ortho-Evra patches weekly  Last Pap: N/A.   Obstetric History  OB History  Gravida Para Term Preterm AB Living  2 1 1     1   SAB TAB Ectopic Multiple Live Births          1    # Outcome Date GA Lbr Len/2nd Weight Sex Delivery Anes PTL Lv  2 Current           1 Term 06/06/16 4173w2d   M Vag-Spont EPI  LIV      Past Medical History:  Diagnosis Date  . Abdominal pain   . Anal fissure   . Asthma   . Constipation   . H/O chlamydia infection 10/30/2015  . Headache     Past Surgical History:  Procedure Laterality Date  . KNEE SURGERY Left 2014, 2015    Current Outpatient Medications on File Prior to Visit  Medication Sig Dispense Refill  . albuterol (PROAIR HFA) 108 (90 Base) MCG/ACT inhaler Inhale into the lungs.    . Doxylamine-Pyridoxine 10-10 MG TBEC Take 10 mg by mouth daily. No more than 4 a day (Patient not taking: Reported on 12/29/2016) 60 tablet 1  . Prenatal Vit-Fe Fumarate-FA (PRENATAL MULTIVITAMIN) TABS tablet Take 1 tablet by mouth daily at 12 noon.     No current facility-administered medications on file prior to visit.     Allergies  Allergen Reactions  . Ibuprofen Nausea Only and Other (See Comments)    ibuprofen 800 mg - Vomiting and Shaking  . Naproxen Itching and Nausea Only    Social History   Socioeconomic History  . Marital status: Single     Spouse name: Not on file  . Number of children: Not on file  . Years of education: Not on file  . Highest education level: Not on file  Social Needs  . Financial resource strain: Not on file  . Food insecurity - worry: Not on file  . Food insecurity - inability: Not on file  . Transportation needs - medical: Not on file  . Transportation needs - non-medical: Not on file  Occupational History  . Not on file  Tobacco Use  . Smoking status: Never Smoker  . Smokeless tobacco: Never Used  Substance and Sexual Activity  . Alcohol use: No  . Drug use: No  . Sexual activity: Yes    Partners: Male  Other Topics Concern  . Not on file  Social History Narrative  . Not on file    Family History  Problem Relation Age of Onset  . Cancer Sister   . Seizures Sister   . Migraines Mother   . Diabetes Paternal Grandmother   . Celiac disease Neg Hx   . Cholelithiasis Neg Hx   . Ulcers Neg Hx     The following portions of the patient's history were reviewed and updated as appropriate: allergies, current medications, past OB history, past medical history, past surgical history, past family  history, past social history, and problem list.  OBJECTIVE:  BP 115/68   Pulse 81   Wt 161 lb 1.6 oz (73.1 kg)   LMP 10/27/2016 (Approximate)   BMI 28.54 kg/m   Initial Physical Exam (New OB)  GENERAL APPEARANCE: alert, well appearing, in no apparent distress  HEAD: normocephalic, atraumatic  MOUTH: mucous membranes moist, pharynx normal without lesions and dental hygiene good  THYROID: no thyromegaly or masses present  BREASTS: not examined  LUNGS: clear to auscultation, no wheezes, rales or rhonchi, symmetric air entry  HEART: regular rate and rhythm, no murmurs  ABDOMEN: soft, nontender, nondistended, no abnormal masses, no epigastric pain, fundus not palpable and FHT present  EXTREMITIES: no redness or tenderness in the calves or thighs, no edema  SKIN: normal coloration and  turgor, no rashes  LYMPH NODES: no adenopathy palpable  NEUROLOGIC: alert, oriented, normal speech, no focal findings or movement disorder noted  PELVIC EXAM: not examined  ASSESSMENT: Normal pregnancy Short interval between pregnancy Headaches in pregnancy Desires genetic screening  PLAN: Prenatal care Rx: Fioricet and Magnesium oxide, see orders. New OB counseling: The patient has been given an overview regarding routine prenatal care. Recommendations regarding diet, weight gain, and exercise in pregnancy were given. Prenatal testing, optional genetic testing, and ultrasound use in pregnancy were reviewed.  Benefits of Breast Feeding were discussed. The patient is encouraged to consider nursing her baby post partum. See orders   Gunnar BullaJenkins Michelle Haim Hansson, CNM

## 2017-01-27 ENCOUNTER — Other Ambulatory Visit: Payer: Self-pay | Admitting: Certified Nurse Midwife

## 2017-01-27 DIAGNOSIS — Z3682 Encounter for antenatal screening for nuchal translucency: Secondary | ICD-10-CM

## 2017-01-29 ENCOUNTER — Other Ambulatory Visit: Payer: BLUE CROSS/BLUE SHIELD

## 2017-01-30 ENCOUNTER — Other Ambulatory Visit: Payer: Self-pay | Admitting: Certified Nurse Midwife

## 2017-01-30 ENCOUNTER — Ambulatory Visit (INDEPENDENT_AMBULATORY_CARE_PROVIDER_SITE_OTHER): Payer: BLUE CROSS/BLUE SHIELD

## 2017-01-30 ENCOUNTER — Ambulatory Visit: Payer: BLUE CROSS/BLUE SHIELD

## 2017-01-30 DIAGNOSIS — Z3492 Encounter for supervision of normal pregnancy, unspecified, second trimester: Secondary | ICD-10-CM | POA: Diagnosis not present

## 2017-01-30 DIAGNOSIS — Z3682 Encounter for antenatal screening for nuchal translucency: Secondary | ICD-10-CM | POA: Diagnosis not present

## 2017-01-30 DIAGNOSIS — Z3491 Encounter for supervision of normal pregnancy, unspecified, first trimester: Secondary | ICD-10-CM | POA: Diagnosis not present

## 2017-02-03 LAB — FIRST TRIMESTER SCREEN W/NT
CRL: 73.9 mm
DIA MoM: 0.53
DIA VALUE: 111.3 pg/mL
Gest Age-Collect: 13.1 weeks
HCG MOM: 0.91
HCG VALUE: 68.6 [IU]/mL
MATERNAL AGE AT EDD: 21 a
NUMBER OF FETUSES: 1
Nuchal Translucency MoM: 0.94
Nuchal Translucency: 1.7 mm
PAPP-A MOM: 0.63
PAPP-A Value: 685 ng/mL
Test Results:: NEGATIVE
Weight: 161 [lb_av]

## 2017-02-20 ENCOUNTER — Encounter: Payer: Self-pay | Admitting: Certified Nurse Midwife

## 2017-02-20 ENCOUNTER — Ambulatory Visit (INDEPENDENT_AMBULATORY_CARE_PROVIDER_SITE_OTHER): Payer: BLUE CROSS/BLUE SHIELD | Admitting: Certified Nurse Midwife

## 2017-02-20 VITALS — BP 118/63 | HR 83 | Wt 166.5 lb

## 2017-02-20 DIAGNOSIS — Z23 Encounter for immunization: Secondary | ICD-10-CM

## 2017-02-20 DIAGNOSIS — Z3482 Encounter for supervision of other normal pregnancy, second trimester: Secondary | ICD-10-CM

## 2017-02-20 LAB — POCT URINALYSIS DIPSTICK
Bilirubin, UA: NEGATIVE
Blood, UA: NEGATIVE
Glucose, UA: NEGATIVE
Ketones, UA: NEGATIVE
Leukocytes, UA: NEGATIVE
Nitrite, UA: NEGATIVE
Protein, UA: NEGATIVE
Spec Grav, UA: 1.025
Urobilinogen, UA: 0.2 U/dL
pH, UA: 5

## 2017-02-20 NOTE — Patient Instructions (Signed)

## 2017-02-20 NOTE — Progress Notes (Signed)
Pt is here for an ROB visit. Refused flu shot.

## 2017-02-20 NOTE — Progress Notes (Signed)
ROB, doing well. No complaints. Reviewed common discomforts of pregnancy. Recommend use of belly band. Discussed anatomy scan at next visit. She verbalizes understanding. Follow up 4 wks.   Doreene BurkeAnnie Inioluwa Baris, CNM

## 2017-03-03 ENCOUNTER — Other Ambulatory Visit: Payer: Self-pay | Admitting: Certified Nurse Midwife

## 2017-03-03 DIAGNOSIS — Z369 Encounter for antenatal screening, unspecified: Secondary | ICD-10-CM

## 2017-03-17 NOTE — L&D Delivery Note (Signed)
2230 Called in room to see patient, reports pelvic pressure and the urge to push. SVE: 10/100/+3, vertex.   Effective maternal pushing efforts noted. Spontaneous vaginal delivery of of liveborn female patient via left occiput anterior position at 2238. Delivered through single loose nuchal cord and body cord x 1. Infant immediately to maternal abdomen. Delayed cord clamping. Three (3) vessel cord and cord blood obtained. AGPARs: 8, 9. Weight pending. Receiving nurse present at bedside for birth and assessment of newborn.   Pitocin bolus infusing. Spontaneous delivery of intact placenta at 2246. Adequate epidural anesthesia. Left labial laceration, hemostatic and unrepaired. EBL: 250 ml. Counts correct x 2. Vault check completed. Uterus firm at U/2, small lochia.   Initiate routine postpartum care and orders. Mom to postpartum.  Baby to Couplet care / Skin to Skin.   [name], CNM [facility], [facility] 08/04/2017, 11:03 PM

## 2017-03-20 ENCOUNTER — Emergency Department
Admission: EM | Admit: 2017-03-20 | Discharge: 2017-03-20 | Disposition: A | Discharge status: Home / Self Care | Payer: BLUE CROSS/BLUE SHIELD

## 2017-03-20 ENCOUNTER — Ambulatory Visit: Payer: BLUE CROSS/BLUE SHIELD

## 2017-03-20 ENCOUNTER — Ambulatory Visit: Payer: BLUE CROSS/BLUE SHIELD | Admitting: Obstetrics and Gynecology

## 2017-03-20 NOTE — ED Triage Notes (Signed)
Called X 3 without answer 5 min apart.

## 2017-03-26 ENCOUNTER — Ambulatory Visit (INDEPENDENT_AMBULATORY_CARE_PROVIDER_SITE_OTHER): Payer: BLUE CROSS/BLUE SHIELD | Admitting: Certified Nurse Midwife

## 2017-03-26 ENCOUNTER — Ambulatory Visit (INDEPENDENT_AMBULATORY_CARE_PROVIDER_SITE_OTHER): Payer: BLUE CROSS/BLUE SHIELD

## 2017-03-26 VITALS — BP 114/76 | HR 78 | Wt 171.7 lb

## 2017-03-26 DIAGNOSIS — Z3492 Encounter for supervision of normal pregnancy, unspecified, second trimester: Secondary | ICD-10-CM

## 2017-03-26 DIAGNOSIS — Z369 Encounter for antenatal screening, unspecified: Secondary | ICD-10-CM | POA: Diagnosis not present

## 2017-03-26 NOTE — Patient Instructions (Signed)

## 2017-03-26 NOTE — Progress Notes (Signed)
ROB-Doing well, no questions or concerns. Anatomy scan today. Findings complete and normal female reviewed with pt and significant other, verbalized understanding. Baby will be named "Malani". Reviewed red flag symptoms and when to call. RTC x 4 weeks for ROB or sooner if needed.   ULTRASOUND REPORT  Location: [facility] Date of Service:  03/26/17  Indications: Anatomy Findings:  Singleton intrauterine pregnancy is visualized with FHR at 145 BPM. Biometrics give an (U/S) Gestational age of 21 5/7 weeks and an (U/S) EDD of 08/08/17; this correlates with the clinically established EDD of 08/04/17.  Fetal presentation is vertex.  EFW: 375 grams (0lb 13oz). Placenta: Anterior and grade 1.  Placenta is 5.5 cm from cervical os. AFI: WNL subjectively.  Anatomic survey is complete and appears WNL; Gender - Female.   Right Ovary measures 3.9 x 2.9 x 2.1 cm and appears WNL.  Left Ovary measures 2.6 x 2.6 x 2.1 cm and appears WNL.  There is no obvious evidence of a corpus luteal cyst. Survey of the adnexa demonstrates no adnexal masses. There is no free peritoneal fluid in the cul de sac.  Impression: 1. 20 5/7 week Viable Singleton Intrauterine pregnancy by U/S. 2. (U/S) EDD is consistent with Clinically established (LMP) EDD of 08/04/17. 3. Normal Anatomy Scan  Recommendations: 1.Clinical correlation with the patient's History and Physical Exam.

## 2017-03-26 NOTE — Progress Notes (Signed)
ROB- anatomy scan done today, pt is doing well 

## 2017-04-24 ENCOUNTER — Encounter: Payer: BLUE CROSS/BLUE SHIELD | Admitting: Certified Nurse Midwife

## 2017-04-24 ENCOUNTER — Ambulatory Visit (INDEPENDENT_AMBULATORY_CARE_PROVIDER_SITE_OTHER): Payer: BLUE CROSS/BLUE SHIELD | Admitting: Certified Nurse Midwife

## 2017-04-24 VITALS — BP 118/64 | HR 81 | Wt 177.4 lb

## 2017-04-24 DIAGNOSIS — Z3492 Encounter for supervision of normal pregnancy, unspecified, second trimester: Secondary | ICD-10-CM

## 2017-04-24 LAB — POCT URINALYSIS DIPSTICK
Bilirubin, UA: NEGATIVE
Blood, UA: NEGATIVE
Glucose, UA: NEGATIVE
KETONES UA: NEGATIVE
LEUKOCYTES UA: NEGATIVE
NITRITE UA: NEGATIVE
PH UA: 7.5 (ref 5.0–8.0)
PROTEIN UA: NEGATIVE
Spec Grav, UA: <=1.005 — AB (ref 1.010–1.025)
UROBILINOGEN UA: 0.2 U/dL

## 2017-04-24 NOTE — Progress Notes (Signed)
Pt is here for an ROB visit. 

## 2017-04-24 NOTE — Patient Instructions (Signed)

## 2017-04-24 NOTE — Progress Notes (Signed)
ROB, dong well. No complaints. PT feels good fetal movement. Has an occasional contractions. Work note today to allow her to sit/stand q 3 hrs. Discussed 1 hr gtt test in 3 wks. She verbalizes understanding and agrees. Follow up 3 wks.   Doreene BurkeAnnie Joyleen Haselton, CNM

## 2017-04-27 ENCOUNTER — Telehealth: Payer: Self-pay | Admitting: *Deleted

## 2017-04-27 NOTE — Telephone Encounter (Signed)
Patient called and states yesterday when she was at work her right lower back started hurting and it radiated to her leg. Patient starting the pain was bad to the point that she started limping. Patient states she feels the pain when she is standing. Patient is requesting a call back. Her contact [phone]. Please advise. Thank you

## 2017-04-27 NOTE — Telephone Encounter (Signed)
Called pt we discussed round ligament pain, gave her some tips to treat

## 2017-04-29 ENCOUNTER — Encounter: Payer: Self-pay | Admitting: Certified Nurse Midwife

## 2017-05-11 ENCOUNTER — Observation Stay
Admission: EM | Admit: 2017-05-11 | Discharge: 2017-05-11 | Disposition: A | Discharge status: Home / Self Care | Payer: Medicaid Other | Attending: Obstetrics and Gynecology | Admitting: Obstetrics and Gynecology

## 2017-05-11 ENCOUNTER — Other Ambulatory Visit: Payer: Self-pay

## 2017-05-11 DIAGNOSIS — Z3A27 27 weeks gestation of pregnancy: Secondary | ICD-10-CM | POA: Insufficient documentation

## 2017-05-11 DIAGNOSIS — Z3490 Encounter for supervision of normal pregnancy, unspecified, unspecified trimester: Secondary | ICD-10-CM

## 2017-05-11 DIAGNOSIS — O26892 Other specified pregnancy related conditions, second trimester: Principal | ICD-10-CM | POA: Insufficient documentation

## 2017-05-11 DIAGNOSIS — O09892 Supervision of other high risk pregnancies, second trimester: Secondary | ICD-10-CM | POA: Insufficient documentation

## 2017-05-11 LAB — URINALYSIS, COMPLETE (UACMP) WITH MICROSCOPIC
Bilirubin Urine: NEGATIVE
Glucose, UA: NEGATIVE mg/dL
HGB URINE DIPSTICK: NEGATIVE
KETONES UR: NEGATIVE mg/dL
Nitrite: NEGATIVE
PROTEIN: NEGATIVE mg/dL
Specific Gravity, Urine: 1.01 (ref 1.005–1.030)
pH: 6 (ref 5.0–8.0)

## 2017-05-11 MED ORDER — ACETAMINOPHEN 500 MG PO TABS
ORAL_TABLET | ORAL | Status: AC
  Filled 2017-05-11: qty 2

## 2017-05-11 MED ORDER — ACETAMINOPHEN 500 MG PO TABS
1000.0000 mg | ORAL_TABLET | Freq: Once | ORAL | Status: AC
  Administered 2017-05-11: 1000 mg via ORAL

## 2017-05-11 NOTE — OB Triage Note (Signed)
Patient came in for observation for abdominal cramping and posterior headache since 1830. Patient rates pain 9/10. Patient reports occasional spots in her bleeding.  Patient denies uterine contractions at this time. Patient reports + FM. Patient denies leaking of fluid, denies vaginal bleeding and spotting. Vital signs stable and patient afebrile. FHR baseline 145 with moderate variability with accelerations 15 x 15 and no decelerations. Patient in room by herself. Will continue to monitor.

## 2017-05-12 ENCOUNTER — Encounter: Payer: Self-pay | Admitting: Certified Nurse Midwife

## 2017-05-12 ENCOUNTER — Telehealth: Payer: Self-pay

## 2017-05-12 ENCOUNTER — Telehealth: Payer: Self-pay | Admitting: Certified Nurse Midwife

## 2017-05-12 MED ORDER — ONDANSETRON 4 MG PO TBDP: 4.0000 mg | ORAL_TABLET | Freq: Four times a day (QID) | ORAL | 0 refills | Status: DC | PRN

## 2017-05-12 NOTE — Telephone Encounter (Signed)
Script sent. mychart message sent also

## 2017-05-12 NOTE — Telephone Encounter (Signed)
The patient called and stated that [place] [facility] [place] [facility] [place] Zo[facility]an that is to be called in for the patients nausea. Please advise.

## 2017-05-15 ENCOUNTER — Ambulatory Visit (INDEPENDENT_AMBULATORY_CARE_PROVIDER_SITE_OTHER): Payer: BLUE CROSS/BLUE SHIELD | Admitting: Certified Nurse Midwife

## 2017-05-15 ENCOUNTER — Other Ambulatory Visit: Payer: BLUE CROSS/BLUE SHIELD

## 2017-05-15 VITALS — BP 116/71 | HR 97 | Wt 180.0 lb

## 2017-05-15 DIAGNOSIS — Z3493 Encounter for supervision of normal pregnancy, unspecified, third trimester: Secondary | ICD-10-CM | POA: Diagnosis not present

## 2017-05-15 DIAGNOSIS — Z130 Encounter for screening for diseases of the blood and blood-forming organs and certain disorders involving the immune mechanism: Secondary | ICD-10-CM

## 2017-05-15 DIAGNOSIS — Z131 Encounter for screening for diabetes mellitus: Secondary | ICD-10-CM

## 2017-05-15 NOTE — Progress Notes (Signed)
ROB- glucola done, blood consent signed, pt is doing well 

## 2017-05-15 NOTE — Patient Instructions (Signed)
Cord Blood Banking Information Cord blood banking is the process of collecting and storing the blood that is in the umbilical cord and placenta at the time of delivery. This blood contains stem cells, which can be used to treat many blood diseases, immune system disorders, and childhood cancers. Stem cells can also be used to research certain diseases and treatments. Many people who choose cord blood banking donate the blood. Donated blood can be used in lifesaving treatments or for research. Other people choose to store the blood privately. Blood that is stored privately can only be used with the person's permission. This option is often chosen if:  A family member needs a stem cell transplant.  The child is part of an ethnic minority.  The child was conceived through in vitro fertilization.  What should I look for in a blood bank? A blood bank is the organization that coordinates cord blood banking. Make sure the cord blood bank that you use:  Is accredited.  Is financially stable.  Handles a large volume of cord blood samples.  Has a procedure in place for transport and storage.  Allows you the option of transferring your cord blood sample.  Has a procedure in place if the bank goes out of business.  Clearly states all costs and limits to future costs.  People who choose to donate cord blood should not need to pay for blood banking. People who keep the blood for private use will need to pay for the first (initial) storage and pay a fee each year (annual fee). Other fees may also apply. What are the risks of cord blood banking? There are no health risks associated with cord blood banking. It is considered safe. How should I prepare? You must schedule this process at least 4-6 weeks before you will be giving birth. How is the blood collected? The blood is collected as soon as the baby has been delivered. Within 15 minutes of delivery, a health care provider will take these actions  to collect the blood:  Clamp the umbilical cord at the top and bottom. This traps the blood in the umbilical cord.  Use a syringe or bag to collect the blood.  Insert needles into the placenta to collect (draw out) more blood.  What happens after the blood is collected? After the blood has been collected:  The blood will be sent to a blood bank.  The blood will be tested for genetic problems and infectious diseases. If the blood tests positive for a genetic problem or a disease, someone will contact you and let you know.  The blood will be frozen.  If your child develops a genetic condition, immune system disorder, or cancer, you will be responsible for contacting the blood bank and letting them know. This information is not intended to replace advice given to you by your health care provider. Make sure you discuss any questions you have with your health care provider. Document Released: 08/21/2009 Document Revised: 08/09/2015 Document Reviewed: 08/21/2014 Elsevier Interactive Patient Education  2018 [employer]. Pain Relief During Labor and Delivery Many things can cause pain during labor and delivery, including:  Pressure on bones and ligaments due to the baby moving through the pelvis.  Stretching of tissues due to the baby moving through the birth canal.  Muscle tension due to anxiety or nervousness.  The uterus tightening (contracting) and relaxing to help move the baby.  There are many ways to deal with the pain of labor and delivery. They  include:  Taking prenatal classes. Taking these classes helps you know what to expect during your baby's birth. What you learn will increase your confidence and decrease your anxiety.  Practicing relaxation techniques or doing relaxing activities, such as: ? Focused breathing. ? Meditation. ? Visualization. ? Aroma therapy. ? Listening to your favorite music. ? Hypnosis.  Taking a warm shower or bath (hydrotherapy). This  may: ? Provide comfort and relaxation. ? Lessen your perception of pain. ? Decrease the amount of pain medicine needed. ? Decrease the length of labor.  Getting a massage or counterpressure on your back.  Applying warm packs or ice packs.  Changing positions often, moving around, or using a birthing ball.  Getting: ? Pain medicine through an IV or injection into a muscle. ? Pain medicine inserted into your spinal column. ? Injections of sterile water just under the skin on your lower back (intradermal injections). ? Laughing gas (nitrous oxide).  Discuss your pain control options with your health care provider during your prenatal visits. Explore the options offered by your hospital or birth center. What kinds of medicine are available? There are two kinds of medicines that can be used to relieve pain during labor and delivery:  Analgesics. These medicines decrease pain without causing you to lose feeling or the ability to move your muscles.  Anesthetics. These medicines block feeling in the body and can decrease your ability to move freely.  Both of these kinds of medicine can cause minor side effects, such as nausea, trouble concentrating, and sleepiness. They can also decrease the baby's heart rate before birth and affect the baby's breathing rate after birth. For this reason, health care providers are careful about when and how much medicine is given. What are specific medicines and procedures that provide pain relief? Local Anesthetics Local anesthetics are used to numb a small area of the body. They may be used along with another kind of anesthetic or used to numb the nerves of the vagina, cervix, and perineum during the second stage of labor. General Anesthetics General anesthetics cause you to lose consciousness so you do not feel pain. They are usually only used for an emergency cesarean delivery. General anesthetics are given through an IV tube and a mask. Pudendal Block A  pudendal block is a form of local anesthetic. It may be used to relieve the pain associated with pushing or stretching of the perineum at the time of delivery or to further numb the perineum. A pudendal block is done by injecting numbing medicine through the vaginal wall into a nerve in the pelvis. Epidural Analgesia Epidural analgesia is given through a flexible IV catheter that is inserted into the lower back. Numbing medicine is delivered continuously to the area near your spinal column nerves (epidural space). After having this type of analgesia, you may be able to move your legs but you most likely will not be able to walk. Depending on the amount of medicine given, you may lose all feeling in the lower half of your body, or you may retain some level of sensation, including the urge to push. Epidural analgesia can be used to provide pain relief for a vaginal birth. Spinal Block A spinal block is similar to epidural analgesia, but the medicine is injected into the spinal fluid instead of the epidural space. A spinal block is only given once. It starts to relieve pain quickly, but the pain relief lasts only 1-6 hours. Spinal blocks can be used for cesarean deliveries. Combined  Spinal-Epidural (CSE) Block A CSE block combines the effects of a spinal block and epidural analgesia. The spinal block works quickly to block all pain. The epidural analgesia provides continuous pain relief, even after the effects of the spinal block have worn off. This information is not intended to replace advice given to you by your health care provider. Make sure you discuss any questions you have with your health care provider. Document Released: 06/19/2008 Document Revised: 08/10/2015 Document Reviewed: 07/25/2015 Elsevier Interactive Patient Education  2018 [employer]. Common Medications Safe in Pregnancy  Acne:      Constipation:  Benzoyl Peroxide     Colace  Clindamycin      Dulcolax Suppository  Topica  Erythromycin     Fibercon  Salicylic Acid      Metamucil         Miralax AVOID:        Senakot   Accutane    Cough:  Retin-A       Cough Drops  Tetracycline      Phenergan w/ Codeine if Rx  Minocycline      Robitussin (Plain & DM)  Antibiotics:     Crabs/Lice:  Ceclor       RID  Cephalosporins    AVOID:  E-Mycins      Kwell  Keflex  Macrobid/Macrodantin   Diarrhea:  Penicillin      Kao-Pectate  Zithromax      Imodium AD         PUSH FLUIDS AVOID:       Cipro     Fever:  Tetracycline      Tylenol (Regular or Extra  Minocycline       Strength)  Levaquin      Extra Strength-Do not          Exceed 8 tabs/24 hrs Caffeine:        '200mg'$ /day (equiv. To 1 cup of coffee or  approx. 3 12 oz sodas)         Gas: Cold/Hayfever:       Gas-X  Benadryl      Mylicon  Claritin       Phazyme  **Claritin-D        Chlor-Trimeton    Headaches:  Dimetapp      ASA-Free Excedrin  Drixoral-Non-Drowsy     Cold Compress  Mucinex (Guaifenasin)     Tylenol (Regular or Extra  Sudafed/Sudafed-12 Hour     Strength)  **Sudafed PE Pseudoephedrine   Tylenol Cold & Sinus     Vicks Vapor Rub  Zyrtec  **AVOID if Problems With Blood Pressure         Heartburn: Avoid lying down for at least 1 hour after meals  Aciphex      Maalox     Rash:  Milk of Magnesia     Benadryl    Mylanta       1% Hydrocortisone Cream  Pepcid  Pepcid Complete   Sleep Aids:  Prevacid      Ambien   Prilosec       Benadryl  Rolaids       Chamomile Tea  Tums (Limit 4/day)     Unisom  Zantac       Tylenol PM         Warm milk-add vanilla or  Hemorrhoids:       Sugar for taste  Anusol/Anusol H.C.  (RX: Analapram 2.5%)  Sugar Substitutes:  Hydrocortisone OTC     Ok in moderation  Preparation H      Tucks        Vaseline lotion applied to tissue with wiping    Herpes:     Throat:  Acyclovir      Oragel  Famvir  Valtrex     Vaccines:         Flu Shot Leg Cramps:       *Gardasil  Benadryl      Hepatitis  A         Hepatitis B Nasal Spray:       Pneumovax  Saline Nasal Spray     Polio Booster         Tetanus Nausea:       Tuberculosis test or PPD  Vitamin B6 25 mg TID   AVOID:    Dramamine      *Gardasil  Emetrol       Live Poliovirus  Ginger Root 250 mg QID    MMR (measles, mumps &  High Complex Carbs @ Bedtime    rebella)  Sea Bands-Accupressure    Varicella (Chickenpox)  Unisom 1/2 tab TID     *No known complications           If received before Pain:         Known pregnancy;   Darvocet       Resume series after  Lortab        Delivery  Percocet    Yeast:   Tramadol      Femstat  Tylenol 3      Gyne-lotrimin  Ultram       Monistat  Vicodin           MISC:         All Sunscreens           Hair Coloring/highlights          Insect Repellant's          (Including DEET)         Mystic Tans Third Trimester of Pregnancy The third trimester is from week 29 through week 42, months 7 through 9. This trimester is when your unborn baby (fetus) is growing very fast. At the end of the ninth month, the unborn baby is about 20 inches in length. It weighs about 6-10 pounds. Follow these instructions at home:  Avoid all smoking, herbs, and alcohol. Avoid drugs not approved by your doctor.  Do not use any tobacco products, including cigarettes, chewing tobacco, and electronic cigarettes. If you need help quitting, ask your doctor. You may get counseling or other support to help you quit.  Only take medicine as told by your doctor. Some medicines are safe and some are not during pregnancy.  Exercise only as told by your doctor. Stop exercising if you start having cramps.  Eat regular, healthy meals.  Wear a good support bra if your breasts are tender.  Do not use hot tubs, steam rooms, or saunas.  Wear your seat belt when driving.  Avoid raw meat, uncooked cheese, and liter boxes and soil used by cats.  Take your prenatal vitamins.  Take 1500-2000 milligrams of calcium daily  starting at the 20th week of pregnancy until you deliver your baby.  Try taking medicine that helps you poop (stool softener) as needed, and if your doctor approves. Eat more fiber by eating fresh fruit, vegetables, and whole grains. Drink enough fluids to keep your pee (urine) clear or pale yellow.  Take warm water baths (sitz baths)  to soothe pain or discomfort caused by hemorrhoids. Use hemorrhoid cream if your doctor approves.  If you have puffy, bulging veins (varicose veins), wear support hose. Raise (elevate) your feet for 15 minutes, 3-4 times a day. Limit salt in your diet.  Avoid heavy lifting, wear low heels, and sit up straight.  Rest with your legs raised if you have leg cramps or low back pain.  Visit your dentist if you have not gone during your pregnancy. Use a soft toothbrush to brush your teeth. Be gentle when you floss.  You can have sex (intercourse) unless your doctor tells you not to.  Do not travel far distances unless you must. Only do so with your doctor's approval.  Take prenatal classes.  Practice driving to the hospital.  Pack your hospital bag.  Prepare the baby's room.  Go to your doctor visits. Get help if:  You are not sure if you are in labor or if your water has broken.  You are dizzy.  You have mild cramps or pressure in your lower belly (abdominal).  You have a nagging pain in your belly area.  You continue to feel sick to your stomach (nauseous), throw up (vomit), or have watery poop (diarrhea).  You have bad smelling fluid coming from your vagina.  You have pain with peeing (urination). Get help right away if:  You have a fever.  You are leaking fluid from your vagina.  You are spotting or bleeding from your vagina.  You have severe belly cramping or pain.  You lose or gain weight rapidly.  You have trouble catching your breath and have chest pain.  You notice sudden or extreme puffiness (swelling) of your face, hands,  ankles, feet, or legs.  You have not felt the baby move in over an hour.  You have severe headaches that do not go away with medicine.  You have vision changes. This information is not intended to replace advice given to you by your health care provider. Make sure you discuss any questions you have with your health care provider. Document Released: 05/28/2009 Document Revised: 08/09/2015 Document Reviewed: 05/04/2012 Elsevier Interactive Patient Education  2017 [employer].

## 2017-05-16 LAB — CBC
HEMATOCRIT: 33 % — AB (ref 34.0–46.6)
Hemoglobin: 11.2 g/dL (ref 11.1–15.9)
MCH: 25.5 pg — ABNORMAL LOW (ref 26.6–33.0)
MCHC: 33.9 g/dL (ref 31.5–35.7)
MCV: 75 fL — ABNORMAL LOW (ref 79–97)
Platelets: 186 10*3/uL (ref 150–379)
RBC: 4.39 x10E6/uL (ref 3.77–5.28)
RDW: 14.4 % (ref 12.3–15.4)
WBC: 7.4 10*3/uL (ref 3.4–10.8)

## 2017-05-16 LAB — RPR: RPR Ser Ql: NONREACTIVE

## 2017-05-16 LAB — GLUCOSE, 1 HOUR GESTATIONAL: GESTATIONAL DIABETES SCREEN: 98 mg/dL (ref 65–139)

## 2017-05-17 NOTE — Discharge Summary (Signed)
Obstetric Discharge Summary  Patient ID: Flo Shankssia Kindley MRN: [id number] DOB/AGE: 22/07/1995 22 y.o.   Date of Admission: 05/11/2017  Date of Discharge: 05/11/2017  Admitting Diagnosis: Observation at 3371w6d  Secondary Diagnosis: Short interval pregnancy     Discharge Diagnosis: No other diagnosis   Antepartum Procedures: NST    Brief Hospital Course   L&D OB Triage Note  Carrina Hay is a 22 y.o. [id number] female at 1171w6d, EDD Estimated Date of Delivery: 08/04/17 who presented to triage for complaints of abdominal pain and headache.  She was evaluated by the nurses with no significant findings for fetal distress or preterm labor. Vital signs stable. An NST was performed and has been reviewed by CNM. She was treated with Tylenol and oral hydration.   NST INTERPRETATION: Indications: rule out uterine contractions  Mode: External Baseline Rate (A): 155 bpm(fht) Variability: Moderate Accelerations: 15 x 15 Decelerations: None   Contraction Frequency (min): none  Impression: reactive   Plan: NST performed was reviewed and was found to be reactive. She was discharged home with bleeding/labor precautions.  Continue routine prenatal care. Follow up with CNM as previously scheduled.   Discharge Instructions: Per After Visit Summary.  Activity: Refer to After Visit Summary  Diet: Regular  Medications: Allergies as of 05/11/2017      Reactions   Ibuprofen Nausea Only, Other (See Comments)   ibuprofen 800 mg - Vomiting and Shaking   Naproxen Itching, Nausea Only      Medication List    ASK your doctor about these medications   Butalbital-APAP-Caffeine 50-325-40 MG capsule Take 1 capsule every 6 (six) hours as needed by mouth for headache.   Doxylamine-Pyridoxine 10-10 MG Tbec Take 10 mg by mouth daily. No more than 4 a day   prenatal multivitamin Tabs tablet Take 1 tablet by mouth daily at 12 noon.   PROAIR HFA 108 (90 Base) MCG/ACT inhaler Generic drug:  albuterol Inhale  into the lungs.      Outpatient follow up:  Postpartum contraception: Uncertain  Discharged Condition: stable  Discharged to: home   Serafina RoyalsMichelle Kyrie Bun, CNM  [facility], [facility]

## 2017-05-19 NOTE — Progress Notes (Signed)
ROB-Doing well. Would like me to attend birth and agreed as long as in town. 28 week labs today. Plans epidural and formula feeding. FOB and mother as labor support. TDaP given. Blood transfusion consent reviewed and signed. Discussed home treatment measures for discomforts of pregnancy including chiropractic care. Work note given for part time hours. Reviewed red flag symptoms and when to call. RTC x 2 week for ROB or sooner if needed.

## 2017-05-28 ENCOUNTER — Ambulatory Visit (INDEPENDENT_AMBULATORY_CARE_PROVIDER_SITE_OTHER): Payer: BLUE CROSS/BLUE SHIELD | Admitting: Obstetrics and Gynecology

## 2017-05-28 VITALS — BP 122/62 | HR 90 | Wt 180.6 lb

## 2017-05-28 DIAGNOSIS — Z3493 Encounter for supervision of normal pregnancy, unspecified, third trimester: Secondary | ICD-10-CM

## 2017-05-28 NOTE — Progress Notes (Signed)
ROB-doing well, no concerns. 

## 2017-05-28 NOTE — Progress Notes (Signed)
ROB- pt is doing well 

## 2017-05-29 ENCOUNTER — Encounter: Payer: BLUE CROSS/BLUE SHIELD | Admitting: Obstetrics and Gynecology

## 2017-06-11 ENCOUNTER — Ambulatory Visit (INDEPENDENT_AMBULATORY_CARE_PROVIDER_SITE_OTHER): Payer: BLUE CROSS/BLUE SHIELD | Admitting: Certified Nurse Midwife

## 2017-06-11 VITALS — BP 109/66 | HR 98 | Wt 183.0 lb

## 2017-06-11 DIAGNOSIS — Z3493 Encounter for supervision of normal pregnancy, unspecified, third trimester: Secondary | ICD-10-CM | POA: Diagnosis not present

## 2017-06-11 LAB — POCT URINALYSIS DIPSTICK
BILIRUBIN UA: NEGATIVE
Glucose, UA: NEGATIVE
Ketones, UA: NEGATIVE
LEUKOCYTES UA: NEGATIVE
NITRITE UA: NEGATIVE
PH UA: 7 (ref 5.0–8.0)
PROTEIN UA: NEGATIVE
RBC UA: NEGATIVE
Spec Grav, UA: 1.02 (ref 1.010–1.025)
UROBILINOGEN UA: 0.2 U/dL

## 2017-06-11 NOTE — Patient Instructions (Signed)
Common Medications Safe in Pregnancy  Acne:      Constipation:  Benzoyl Peroxide     Colace  Clindamycin      Dulcolax Suppository  Topica Erythromycin     Fibercon  Salicylic Acid      Metamucil         Miralax AVOID:        Senakot   Accutane    Cough:  Retin-A       Cough Drops  Tetracycline      Phenergan w/ Codeine if Rx  Minocycline      Robitussin (Plain & DM)  Antibiotics:     Crabs/Lice:  Ceclor       RID  Cephalosporins    AVOID:  E-Mycins      Kwell  Keflex  Macrobid/Macrodantin   Diarrhea:  Penicillin      Kao-Pectate  Zithromax      Imodium AD         PUSH FLUIDS AVOID:       Cipro     Fever:  Tetracycline      Tylenol (Regular or Extra  Minocycline       Strength)  Levaquin      Extra Strength-Do not          Exceed 8 tabs/24 hrs Caffeine:        <200mg/day (equiv. To 1 cup of coffee or  approx. 3 12 oz sodas)         Gas: Cold/Hayfever:       Gas-X  Benadryl      Mylicon  Claritin       Phazyme  **Claritin-D        Chlor-Trimeton    Headaches:  Dimetapp      ASA-Free Excedrin  Drixoral-Non-Drowsy     Cold Compress  Mucinex (Guaifenasin)     Tylenol (Regular or Extra  Sudafed/Sudafed-12 Hour     Strength)  **Sudafed PE Pseudoephedrine   Tylenol Cold & Sinus     Vicks Vapor Rub  Zyrtec  **AVOID if Problems With Blood Pressure         Heartburn: Avoid lying down for at least 1 hour after meals  Aciphex      Maalox     Rash:  Milk of Magnesia     Benadryl    Mylanta       1% Hydrocortisone Cream  Pepcid  Pepcid Complete   Sleep Aids:  Prevacid      Ambien   Prilosec       Benadryl  Rolaids       Chamomile Tea  Tums (Limit 4/day)     Unisom  Zantac       Tylenol PM         Warm milk-add vanilla or  Hemorrhoids:       Sugar for taste  Anusol/Anusol H.C.  (RX: Analapram 2.5%)  Sugar Substitutes:  Hydrocortisone OTC     Ok in moderation  Preparation H      Tucks        Vaseline lotion applied to tissue with  wiping    Herpes:     Throat:  Acyclovir      Oragel  Famvir  Valtrex     Vaccines:         Flu Shot Leg Cramps:       *Gardasil  Benadryl      Hepatitis A         Hepatitis B Nasal Spray:         Pneumovax  Saline Nasal Spray     Polio Booster         Tetanus Nausea:       Tuberculosis test or PPD  Vitamin B6 25 mg TID   AVOID:    Dramamine      *Gardasil  Emetrol       Live Poliovirus  Ginger Root 250 mg QID    MMR (measles, mumps &  High Complex Carbs @ Bedtime    rebella)  Sea Bands-Accupressure    Varicella (Chickenpox)  Unisom 1/2 tab TID     *No known complications           If received before Pain:         Known pregnancy;   Darvocet       Resume series after  Lortab        Delivery  Percocet    Yeast:   Tramadol      Femstat  Tylenol 3      Gyne-lotrimin  Ultram       Monistat  Vicodin           MISC:         All Sunscreens           Hair Coloring/highlights          Insect Repellant's          (Including DEET)         Mystic Tans Fetal Movement Counts Patient Name: ________________________________________________ Patient Due Date: ____________________ What is a fetal movement count? A fetal movement count is the number of times that you feel your baby move during a certain amount of time. This may also be called a fetal kick count. A fetal movement count is recommended for every pregnant woman. You may be asked to start counting fetal movements as early as week 28 of your pregnancy. Pay attention to when your baby is most active. You may notice your baby's sleep and wake cycles. You may also notice things that make your baby move more. You should do a fetal movement count:  When your baby is normally most active.  At the same time each day.  A good time to count movements is while you are resting, after having something to eat and drink. How do I count fetal movements? 1. Find a quiet, comfortable area. Sit, or lie down on your side. 2. Write down the date,  the start time and stop time, and the number of movements that you felt between those two times. Take this information with you to your health care visits. 3. For 2 hours, count kicks, flutters, swishes, rolls, and jabs. You should feel at least 10 movements during 2 hours. 4. You may stop counting after you have felt 10 movements. 5. If you do not feel 10 movements in 2 hours, have something to eat and drink. Then, keep resting and counting for 1 hour. If you feel at least 4 movements during that hour, you may stop counting. Contact a health care provider if:  You feel fewer than 4 movements in 2 hours.  Your baby is not moving like he or she usually does. Date: ____________ Start time: ____________ Stop time: ____________ Movements: ____________ Date: ____________ Start time: ____________ Stop time: ____________ Movements: ____________ Date: ____________ Start time: ____________ Stop time: ____________ Movements: ____________ Date: ____________ Start time: ____________ Stop time: ____________ Movements: ____________ Date: ____________ Start time: ____________ Stop time: ____________ Movements: ____________ Date: ____________ Start time: ____________ Stop   time: ____________ Movements: ____________ Date: ____________ Start time: ____________ Stop time: ____________ Movements: ____________ Date: ____________ Start time: ____________ Stop time: ____________ Movements: ____________ Date: ____________ Start time: ____________ Stop time: ____________ Movements: ____________ This information is not intended to replace advice given to you by your health care provider. Make sure you discuss any questions you have with your health care provider. Document Released: 04/02/2006 Document Revised: 10/31/2015 Document Reviewed: 04/12/2015 Elsevier Interactive Patient Education  2018 Elsevier Inc.  

## 2017-06-11 NOTE — Progress Notes (Signed)
Pt is here for an ROB visit. 

## 2017-06-11 NOTE — Progress Notes (Signed)
ROB-Doing well, no questions or concerns. Reviewed red flag symptoms and when to call. RTC x 2 weeks for ROB or sooner if needed.

## 2017-06-24 ENCOUNTER — Encounter: Payer: Self-pay | Admitting: Certified Nurse Midwife

## 2017-06-24 ENCOUNTER — Ambulatory Visit (INDEPENDENT_AMBULATORY_CARE_PROVIDER_SITE_OTHER): Payer: BLUE CROSS/BLUE SHIELD | Admitting: Certified Nurse Midwife

## 2017-06-24 VITALS — BP 102/63 | HR 83 | Wt 181.4 lb

## 2017-06-24 DIAGNOSIS — Z3493 Encounter for supervision of normal pregnancy, unspecified, third trimester: Secondary | ICD-10-CM

## 2017-06-24 LAB — POCT URINALYSIS DIPSTICK
Bilirubin, UA: NEGATIVE
Blood, UA: NEGATIVE
Glucose, UA: NEGATIVE
KETONES UA: NEGATIVE
Leukocytes, UA: NEGATIVE
NITRITE UA: NEGATIVE
PH UA: 6 (ref 5.0–8.0)
PROTEIN UA: NEGATIVE
SPEC GRAV UA: 1.015 (ref 1.010–1.025)
UROBILINOGEN UA: 0.2 U/dL

## 2017-06-24 NOTE — Progress Notes (Signed)
ROB doing well. Feels good movement. Anticipatory guidance for GBS testing at next visi. Follow up 2 wk.   Doreene BurkeAnnie Orlo Brickle, CNM

## 2017-06-24 NOTE — Progress Notes (Signed)
Pt is here for an ROB visit. 

## 2017-06-24 NOTE — Patient Instructions (Signed)
Group B Streptococcus Infection During Pregnancy Group B Streptococcus (GBS) is a type of bacteria (Streptococcus agalactiae) that is often found in healthy people, commonly in the rectum, vagina, and intestines. In people who are healthy and not pregnant, the bacteria rarely cause serious illness or complications. However, women who test positive for GBS during pregnancy can pass the bacteria to their baby during childbirth, which can cause serious infection in the baby after birth. Women with GBS may also have infections during their pregnancy or immediately after childbirth, such as such as urinary tract infections (UTIs) or infections of the uterus (uterine infections). Having GBS also increases a woman's risk of complications during pregnancy, such as early (preterm) labor or delivery, miscarriage, or stillbirth. Routine testing (screening) for GBS is recommended for all pregnant women. What increases the risk? You may have a higher risk for GBS infection during pregnancy if you had one during a past pregnancy. What are the signs or symptoms? In most cases, GBS infection does not cause symptoms in pregnant women. Signs and symptoms of a possible GBS-related infection may include:  Labor starting before the 37th week of pregnancy.  A UTI or bladder infection, which may cause: ? Fever. ? Pain or burning during urination. ? Frequent urination.  Fever during labor, along with: ? Bad-smelling discharge. ? Uterine tenderness. ? Rapid heartbeat in the mother, baby, or both.  Rare but serious symptoms of a possible GBS-related infection in women include:  Blood infection (septicemia). This may cause fever, chills, or confusion.  Lung infection (pneumonia). This may cause fever, chills, cough, rapid breathing, difficulty breathing, or chest pain.  Bone, joint, skin, or soft tissue infection.  How is this diagnosed? You may be screened for GBS between week 35 and week 37 of your pregnancy. If  you have symptoms of preterm labor, you may be screened earlier. This condition is diagnosed based on lab test results from:  A swab of fluid from the vagina and rectum.  A urine sample.  How is this treated? This condition is treated with antibiotic medicine. When you go into labor, or as soon as your water breaks (your membranes rupture), you will be given antibiotics through an IV tube. Antibiotics will continue until after you give birth. If you are having a cesarean delivery, you do not need antibiotics unless your membranes have already ruptured. Follow these instructions at home:  Take over-the-counter and prescription medicines only as told by your health care provider.  Take your antibiotic medicine as told by your health care provider. Do not stop taking the antibiotic even if you start to feel better.  Keep all pre-birth (prenatal) visits and follow-up visits as told by your health care provider. This is important. Contact a health care provider if:  You have pain or burning when you urinate.  You have to urinate frequently.  You have a fever or chills.  You develop a bad-smelling vaginal discharge. Get help right away if:  Your membranes rupture.  You go into labor.  You have severe pain in your abdomen.  You have difficulty breathing.  You have chest pain. This information is not intended to replace advice given to you by your health care provider. Make sure you discuss any questions you have with your health care provider. Document Released: 06/10/2007 Document Revised: 09/28/2015 Document Reviewed: 09/27/2015 Elsevier Interactive Patient Education  2018 Elsevier Inc.  

## 2017-07-07 ENCOUNTER — Ambulatory Visit (INDEPENDENT_AMBULATORY_CARE_PROVIDER_SITE_OTHER): Payer: BLUE CROSS/BLUE SHIELD | Admitting: Certified Nurse Midwife

## 2017-07-07 VITALS — BP 114/64 | HR 86 | Wt 184.5 lb

## 2017-07-07 DIAGNOSIS — M549 Dorsalgia, unspecified: Secondary | ICD-10-CM

## 2017-07-07 DIAGNOSIS — Z3493 Encounter for supervision of normal pregnancy, unspecified, third trimester: Secondary | ICD-10-CM

## 2017-07-07 DIAGNOSIS — O9989 Other specified diseases and conditions complicating pregnancy, childbirth and the puerperium: Secondary | ICD-10-CM

## 2017-07-07 MED ORDER — CYCLOBENZAPRINE HCL 10 MG PO TABS: 10.0000 mg | ORAL_TABLET | Freq: Three times a day (TID) | ORAL | 2 refills | Status: DC | PRN

## 2017-07-07 NOTE — Progress Notes (Signed)
ROB-Reports continuous back pain and increased pelvic pressure, no relief with warm bath. Discussed home treatment measures. Rx: Flexeril, see orders. Reviewed red flag symptoms and when to call. RTC x 1 week for ROB or sooner if needed.

## 2017-07-07 NOTE — Patient Instructions (Addendum)
   Back Pain in Pregnancy Back pain during pregnancy is common. Back pain may be caused by several factors that are related to changes during your pregnancy. Follow these instructions at home: Managing pain, stiffness, and swelling  If directed, apply ice for sudden (acute) back pain. ? Put ice in a plastic bag. ? Place a towel between your skin and the bag. ? Leave the ice on for 20 minutes, 2-3 times per day.  If directed, apply heat to the affected area before you exercise: ? Place a towel between your skin and the heat pack or heating pad. ? Leave the heat on for 20-30 minutes. ? Remove the heat if your skin turns bright red. This is especially important if you are unable to feel pain, heat, or cold. You may have a greater risk of getting burned. Activity  Exercise as told by your health care provider. Exercising is the best way to prevent or manage back pain.  Listen to your body when lifting. If lifting hurts, ask for help or bend your knees. This uses your leg muscles instead of your back muscles.  Squat down when picking up something from the floor. Do not bend over.  Only use bed rest as told by your health care provider. Bed rest should only be used for the most severe episodes of back pain. Standing, Sitting, and Lying Down  Do not stand in one place for long periods of time.  Use good posture when sitting. Make sure your head rests over your shoulders and is not hanging forward. Use a pillow on your lower back if necessary.  Try sleeping on your side, preferably the left side, with a pillow or two between your legs. If you are sore after a night's rest, your bed may be too soft. A firm mattress may provide more support for your back during pregnancy. General instructions  Do not wear high heels.  Eat a healthy diet. Try to gain weight within your health care provider's recommendations.  Use a maternity girdle, elastic sling, or back brace as told by your health  care provider.  Take over-the-counter and prescription medicines only as told by your health care provider.  Keep all follow-up visits as told by your health care provider. This is important. This includes any visits with any specialists, such as a physical therapist. Contact a health care provider if:  Your back pain interferes with your daily activities.  You have increasing pain in other parts of your body. Get help right away if:  You develop numbness, tingling, weakness, or problems with the use of your arms or legs.  You develop severe back pain that is not controlled with medicine.  You have a sudden change in bowel or bladder control.  You develop shortness of breath, dizziness, or you faint.  You develop nausea, vomiting, or sweating.  You have back pain that is a rhythmic, cramping pain similar to labor pains. Labor pain is usually 1-2 minutes apart, lasts for about 1 minute, and involves a bearing down feeling or pressure in your pelvis.  You have back pain and your water breaks or you have vaginal bleeding.  You have back pain or numbness that travels down your leg.  Your back pain developed after you fell.  You develop pain on one side of your back.  You see blood in your urine.  You develop skin blisters in the area of your back pain. This information is not intended to replace advice   given to you by your health care provider. Make sure you discuss any questions you have with your health care provider. Document Released: 06/11/2005 Document Revised: 08/09/2015 Document Reviewed: 11/15/2014 Elsevier Interactive Patient Education  2018 [employer] Inc[employer] Cyclobenzaprine tablets What is this medicine? CYCLOBENZAPRINE (sye kloe BEN za preen) is a muscle relaxer. It is used to treat muscle pain, spasms, and stiffness. This medicine may be used for other purposes; ask your health care provider or pharmacist if you have questions. COMMON BRAND NAME(S): Fexmid,  Flexeril What should I tell my health care provider before I take this medicine? They need to know if you have any of these conditions: -heart disease, irregular heartbeat, or previous heart attack -liver disease -thyroid problem -an unusual or allergic reaction to cyclobenzaprine, tricyclic antidepressants, lactose, other medicines, foods, dyes, or preservatives -pregnant or trying to get pregnant -breast-feeding How should I use this medicine? Take this medicine by mouth with a glass of water. Follow the directions on the prescription label. If this medicine upsets your stomach, take it with food or milk. Take your medicine at regular intervals. Do not take it more often than directed. Talk to your pediatrician regarding the use of this medicine in children. Special care may be needed. Overdosage: If you think you have taken too much of this medicine contact a poison control center or emergency room at once. NOTE: This medicine is only for you. Do not share this medicine with others. What if I miss a dose? If you miss a dose, take it as soon as you can. If it is almost time for your next dose, take only that dose. Do not take double or extra doses. What may interact with this medicine? Do not take this medicine with any of the following medications: -certain medicines for fungal infections like fluconazole, itraconazole, ketoconazole, posaconazole, voriconazole -cisapride -dofetilide -dronedarone -halofantrine -levomethadyl -MAOIs like Carbex, Eldepryl, Marplan, Nardil, and Parnate -narcotic medicines for cough -pimozide -thioridazine -ziprasidone This medicine may also interact with the following medications: -alcohol -antihistamines for allergy, cough and cold -certain medicines for anxiety or sleep -certain medicines for cancer -certain medicines for depression like amitriptyline, fluoxetine, sertraline -certain medicines for infection like alfuzosin, chloroquine,  clarithromycin, levofloxacin, mefloquine, pentamidine, troleandomycin -certain medicines for irregular heart beat -certain medicines for seizures like phenobarbital, primidone -contrast dyes -general anesthetics like halothane, isoflurane, methoxyflurane, propofol -local anesthetics like lidocaine, pramoxine, tetracaine -medicines that relax muscles for surgery -narcotic medicines for pain -other medicines that prolong the QT interval (cause an abnormal heart rhythm) -phenothiazines like chlorpromazine, mesoridazine, prochlorperazine This list may not describe all possible interactions. Give your health care provider a list of all the medicines, herbs, non-prescription drugs, or dietary supplements you use. Also tell them if you smoke, drink alcohol, or use illegal drugs. Some items may interact with your medicine. What should I watch for while using this medicine? Tell your doctor or health care professional if your symptoms do not start to get better or if they get worse. You may get drowsy or dizzy. Do not drive, use machinery, or do anything that needs mental alertness until you know how this medicine affects you. Do not stand or sit up quickly, especially if you are an older patient. This reduces the risk of dizzy or fainting spells. Alcohol may interfere with the effect of this medicine. Avoid alcoholic drinks. If you are taking another medicine that also causes drowsiness, you may have more side effects. Give your health care provider a list of all   medicines you use. Your doctor will tell you how much medicine to take. Do not take more medicine than directed. Call emergency for help if you have problems breathing or unusual sleepiness. Your mouth may get dry. Chewing sugarless gum or sucking hard candy, and drinking plenty of water may help. Contact your doctor if the problem does not go away or is severe. What side effects may I notice from receiving this medicine? Side effects that you  should report to your doctor or health care professional as soon as possible: -allergic reactions like skin rash, itching or hives, swelling of the face, lips, or tongue -breathing problems -chest pain -fast, irregular heartbeat -hallucinations -seizures -unusually weak or tired Side effects that usually do not require medical attention (report to your doctor or health care professional if they continue or are bothersome): -headache -nausea, vomiting This list may not describe all possible side effects. Call your doctor for medical advice about side effects. You may report side effects to FDA at 1-800-FDA-1088. Where should I keep my medicine? Keep out of the reach of children. Store at room temperature between 15 and 30 degrees C (59 and 86 degrees F). Keep container tightly closed. Throw away any unused medicine after the expiration date. NOTE: This sheet is a summary. It may not cover all possible information. If you have questions about this medicine, talk to your doctor, pharmacist, or health care provider.  2018 Elsevier/Gold Standard (2014-12-12 12:05:46)  

## 2017-07-07 NOTE — Progress Notes (Signed)
Pt is here with c/o pressure and back pain. Is due for cultures.

## 2017-07-09 ENCOUNTER — Encounter: Payer: BLUE CROSS/BLUE SHIELD | Admitting: Certified Nurse Midwife

## 2017-07-09 ENCOUNTER — Encounter: Payer: Self-pay | Admitting: Certified Nurse Midwife

## 2017-07-09 DIAGNOSIS — B951 Streptococcus, group B, as the cause of diseases classified elsewhere: Secondary | ICD-10-CM | POA: Insufficient documentation

## 2017-07-09 LAB — STREP GP B NAA: STREP GROUP B AG: POSITIVE — AB

## 2017-07-09 LAB — GC/CHLAMYDIA PROBE AMP
CHLAMYDIA, DNA PROBE: NEGATIVE
NEISSERIA GONORRHOEAE BY PCR: NEGATIVE

## 2017-07-10 ENCOUNTER — Telehealth: Payer: Self-pay

## 2017-07-10 NOTE — Telephone Encounter (Signed)
Spoke with pt- asked her to stop by the office to leave a urine specimen for a culture. She stated she would come by on 07/08/17 to leave sample.

## 2017-07-14 ENCOUNTER — Ambulatory Visit (INDEPENDENT_AMBULATORY_CARE_PROVIDER_SITE_OTHER): Payer: BLUE CROSS/BLUE SHIELD | Admitting: Certified Nurse Midwife

## 2017-07-14 VITALS — BP 112/64 | HR 93 | Wt 187.1 lb

## 2017-07-14 DIAGNOSIS — Z3493 Encounter for supervision of normal pregnancy, unspecified, third trimester: Secondary | ICD-10-CM | POA: Diagnosis not present

## 2017-07-14 DIAGNOSIS — M549 Dorsalgia, unspecified: Secondary | ICD-10-CM | POA: Diagnosis not present

## 2017-07-14 LAB — POCT URINALYSIS DIPSTICK
Bilirubin, UA: NEGATIVE
GLUCOSE UA: NEGATIVE
Ketones, UA: NEGATIVE
LEUKOCYTES UA: NEGATIVE
Nitrite, UA: NEGATIVE
RBC UA: NEGATIVE
Spec Grav, UA: 1.015 (ref 1.010–1.025)
Urobilinogen, UA: 0.2 E.U./dL
pH, UA: 7 (ref 5.0–8.0)

## 2017-07-14 NOTE — Progress Notes (Signed)
[name] [name],reports doing well but c/o pressure, nausea, occasional contractions, . Patient instructed on self help measures and Labor precautions. SVE today per pt. Request 1/50/-3.  Return in 1 week [name] [name].  Shanika Creacy,SNM/Itay Mella,CNM

## 2017-07-14 NOTE — Patient Instructions (Signed)
Braxton Hicks Contractions °Contractions of the uterus can occur throughout pregnancy, but they are not always a sign that you are in labor. You may have practice contractions called Braxton Hicks contractions. These false labor contractions are sometimes confused with true labor. °What are Braxton Hicks contractions? °Braxton Hicks contractions are tightening movements that occur in the muscles of the uterus before labor. Unlike true labor contractions, these contractions do not result in opening (dilation) and thinning of the cervix. Toward the end of pregnancy (32-34 weeks), Braxton Hicks contractions can happen more often and may become stronger. These contractions are sometimes difficult to tell apart from true labor because they can be very uncomfortable. You should not feel embarrassed if you go to the hospital with false labor. °Sometimes, the only way to tell if you are in true labor is for your health care provider to look for changes in the cervix. The health care provider will do a physical exam and may monitor your contractions. If you are not in true labor, the exam should show that your cervix is not dilating and your water has not broken. °If there are other health problems associated with your pregnancy, it is completely safe for you to be sent home with false labor. You may continue to have Braxton Hicks contractions until you go into true labor. °How to tell the difference between true labor and false labor °True labor °· Contractions last 30-70 seconds. °· Contractions become very regular. °· Discomfort is usually felt in the top of the uterus, and it spreads to the lower abdomen and low back. °· Contractions do not go away with walking. °· Contractions usually become more intense and increase in frequency. °· The cervix dilates and gets thinner. °False labor °· Contractions are usually shorter and not as strong as true labor contractions. °· Contractions are usually irregular. °· Contractions  are often felt in the front of the lower abdomen and in the groin. °· Contractions may go away when you walk around or change positions while lying down. °· Contractions get weaker and are shorter-lasting as time goes on. °· The cervix usually does not dilate or become thin. °Follow these instructions at home: °· Take over-the-counter and prescription medicines only as told by your health care provider. °· Keep up with your usual exercises and follow other instructions from your health care provider. °· Eat and drink lightly if you think you are going into labor. °· If Braxton Hicks contractions are making you uncomfortable: °? Change your position from lying down or resting to walking, or change from walking to resting. °? Sit and rest in a tub of warm water. °? Drink enough fluid to keep your urine pale yellow. Dehydration may cause these contractions. °? Do slow and deep breathing several times an hour. °· Keep all follow-up prenatal visits as told by your health care provider. This is important. °Contact a health care provider if: °· You have a fever. °· You have continuous pain in your abdomen. °Get help right away if: °· Your contractions become stronger, more regular, and closer together. °· You have fluid leaking or gushing from your vagina. °· You pass blood-tinged mucus (bloody show). °· You have bleeding from your vagina. °· You have low back pain that you never had before. °· You feel your baby’s head pushing down and causing pelvic pressure. °· Your baby is not moving inside you as much as it used to. °Summary °· Contractions that occur before labor are called Braxton   Hicks contractions, false labor, or practice contractions. °· Braxton Hicks contractions are usually shorter, weaker, farther apart, and less regular than true labor contractions. True labor contractions usually become progressively stronger and regular and they become more frequent. °· Manage discomfort from Braxton Hicks contractions by  changing position, resting in a warm bath, drinking plenty of water, or practicing deep breathing. °This information is not intended to replace advice given to you by your health care provider. Make sure you discuss any questions you have with your health care provider. °Document Released: 07/17/2016 Document Revised: 07/17/2016 Document Reviewed: 07/17/2016 °Elsevier Interactive Patient Education © 2018 [employer] ° °

## 2017-07-14 NOTE — Progress Notes (Signed)
Pt is here for an ROB visit. Has a lot of pressure. States she had some contractrions but they went away after she layed down.

## 2017-07-16 LAB — URINE CULTURE

## 2017-07-21 ENCOUNTER — Ambulatory Visit (INDEPENDENT_AMBULATORY_CARE_PROVIDER_SITE_OTHER): Payer: BLUE CROSS/BLUE SHIELD | Admitting: Certified Nurse Midwife

## 2017-07-21 VITALS — BP 116/72 | HR 71 | Wt 187.2 lb

## 2017-07-21 DIAGNOSIS — Z3493 Encounter for supervision of normal pregnancy, unspecified, third trimester: Secondary | ICD-10-CM

## 2017-07-21 LAB — POCT URINALYSIS DIPSTICK
BILIRUBIN UA: NEGATIVE
Blood, UA: NEGATIVE
Glucose, UA: NEGATIVE
Ketones, UA: NEGATIVE
Leukocytes, UA: NEGATIVE
Nitrite, UA: NEGATIVE
PH UA: 7 (ref 5.0–8.0)
Spec Grav, UA: 1.015 (ref 1.010–1.025)
Urobilinogen, UA: 0.2 E.U./dL

## 2017-07-21 NOTE — Patient Instructions (Signed)
Vaginal Delivery Vaginal delivery means that you will give birth by pushing your baby out of your birth canal (vagina). A team of health care providers will help you before, during, and after vaginal delivery. Birth experiences are unique for every woman and every pregnancy, and birth experiences vary depending on where you choose to give birth. What should I do to prepare for my baby's birth? Before your baby is born, it is important to talk with your health care provider about:  Your labor and delivery preferences. These may include: ? Medicines that you may be given. ? How you will manage your pain. This might include non-medical pain relief techniques or injectable pain relief such as epidural analgesia. ? How you and your baby will be monitored during labor and delivery. ? Who may be in the labor and delivery room with you. ? Your feelings about surgical delivery of your baby (cesarean delivery, or C-section) if this becomes necessary. ? Your feelings about receiving donated blood through an IV tube (blood transfusion) if this becomes necessary.  Whether you are able: ? To take pictures or videos of the birth. ? To eat during labor and delivery. ? To move around, walk, or change positions during labor and delivery.  What to expect after your baby is born, such as: ? Whether delayed umbilical cord clamping and cutting is offered. ? Who will care for your baby right after birth. ? Medicines or tests that may be recommended for your baby. ? Whether breastfeeding is supported in your hospital or birth center. ? How long you will be in the hospital or birth center.  How any medical conditions you have may affect your baby or your labor and delivery experience.  To prepare for your baby's birth, you should also:  Attend all of your health care visits before delivery (prenatal visits) as recommended by your health care provider. This is important.  Prepare your home for your baby's  arrival. Make sure that you have: ? Diapers. ? Baby clothing. ? Feeding equipment. ? Safe sleeping arrangements for you and your baby.  Install a car seat in your vehicle. Have your car seat checked by a certified car seat installer to make sure that it is installed safely.  Think about who will help you with your new baby at home for at least the first several weeks after delivery.  What can I expect when I arrive at the birth center or hospital? Once you are in labor and have been admitted into the hospital or birth center, your health care provider may:  Review your pregnancy history and any concerns you have.  Insert an IV tube into one of your veins. This is used to give you fluids and medicines.  Check your blood pressure, pulse, temperature, and heart rate (vital signs).  Check whether your bag of water (amniotic sac) has broken (ruptured).  Talk with you about your birth plan and discuss pain control options.  Monitoring Your health care provider may monitor your contractions (uterine monitoring) and your baby's heart rate (fetal monitoring). You may need to be monitored:  Often, but not continuously (intermittently).  All the time or for long periods at a time (continuously). Continuous monitoring may be needed if: ? You are taking certain medicines, such as medicine to relieve pain or make your contractions stronger. ? You have pregnancy or labor complications.  Monitoring may be done by:  Placing a special stethoscope or a handheld monitoring device on your abdomen to   check your baby's heartbeat, and feeling your abdomen for contractions. This method of monitoring does not continuously record your baby's heartbeat or your contractions.  Placing monitors on your abdomen (external monitors) to record your baby's heartbeat and the frequency and length of contractions. You may not have to wear external monitors all the time.  Placing monitors inside of your uterus  (internal monitors) to record your baby's heartbeat and the frequency, length, and strength of your contractions. ? Your health care provider may use internal monitors if he or she needs more information about the strength of your contractions or your baby's heart rate. ? Internal monitors are put in place by passing a thin, flexible wire through your vagina and into your uterus. Depending on the type of monitor, it may remain in your uterus or on your baby's head until birth. ? Your health care provider will discuss the benefits and risks of internal monitoring with you and will ask for your permission before inserting the monitors.  Telemetry. This is a type of continuous monitoring that can be done with external or internal monitors. Instead of having to stay in bed, you are able to move around during telemetry. Ask your health care provider if telemetry is an option for you.  Physical exam Your health care provider may perform a physical exam. This may include:  Checking whether your baby is positioned: ? With the head toward your vagina (head-down). This is most common. ? With the head toward the top of your uterus (head-up or breech). If your baby is in a breech position, your health care provider may try to turn your baby to a head-down position so you can deliver vaginally. If it does not seem that your baby can be born vaginally, your provider may recommend surgery to deliver your baby. In rare cases, you may be able to deliver vaginally if your baby is head-up (breech delivery). ? Lying sideways (transverse). Babies that are lying sideways cannot be delivered vaginally.  Checking your cervix to determine: ? Whether it is thinning out (effacing). ? Whether it is opening up (dilating). ? How low your baby has moved into your birth canal.  What are the three stages of labor and delivery?  Normal labor and delivery is divided into the following three stages: Stage 1  Stage 1 is the  longest stage of labor, and it can last for hours or days. Stage 1 includes: ? Early labor. This is when contractions may be irregular, or regular and mild. Generally, early labor contractions are more than 10 minutes apart. ? Active labor. This is when contractions get longer, more regular, more frequent, and more intense. ? The transition phase. This is when contractions happen very close together, are very intense, and may last longer than during any other part of labor.  Contractions generally feel mild, infrequent, and irregular at first. They get stronger, more frequent (about every 2-3 minutes), and more regular as you progress from early labor through active labor and transition.  Many women progress through stage 1 naturally, but you may need help to continue making progress. If this happens, your health care provider may talk with you about: ? Rupturing your amniotic sac if it has not ruptured yet. ? Giving you medicine to help make your contractions stronger and more frequent.  Stage 1 ends when your cervix is completely dilated to 4 inches (10 cm) and completely effaced. This happens at the end of the transition phase. Stage 2  Once   your cervix is completely effaced and dilated to 4 inches (10 cm), you may start to feel an urge to push. It is common for the body to naturally take a rest before feeling the urge to push, especially if you received an epidural or certain other pain medicines. This rest period may last for up to 1-2 hours, depending on your unique labor experience.  During stage 2, contractions are generally less painful, because pushing helps relieve contraction pain. Instead of contraction pain, you may feel stretching and burning pain, especially when the widest part of your baby's head passes through the vaginal opening (crowning).  Your health care provider will closely monitor your pushing progress and your baby's progress through the vagina during stage 2.  Your  health care provider may massage the area of skin between your vaginal opening and anus (perineum) or apply warm compresses to your perineum. This helps it stretch as the baby's head starts to crown, which can help prevent perineal tearing. ? In some cases, an incision may be made in your perineum (episiotomy) to allow the baby to pass through the vaginal opening. An episiotomy helps to make the opening of the vagina larger to allow more room for the baby to fit through.  It is very important to breathe and focus so your health care provider can control the delivery of your baby's head. Your health care provider may have you decrease the intensity of your pushing, to help prevent perineal tearing.  After delivery of your baby's head, the shoulders and the rest of the body generally deliver very quickly and without difficulty.  Once your baby is delivered, the umbilical cord may be cut right away, or this may be delayed for 1-2 minutes, depending on your baby's health. This may vary among health care providers, hospitals, and birth centers.  If you and your baby are healthy enough, your baby may be placed on your chest or abdomen to help maintain the baby's temperature and to help you bond with each other. Some mothers and babies start breastfeeding at this time. Your health care team will dry your baby and help keep your baby warm during this time.  Your baby may need immediate care if he or she: ? Showed signs of distress during labor. ? Has a medical condition. ? Was born too early (prematurely). ? Had a bowel movement before birth (meconium). ? Shows signs of difficulty transitioning from being inside the uterus to being outside of the uterus. If you are planning to breastfeed, your health care team will help you begin a feeding. Stage 3  The third stage of labor starts immediately after the birth of your baby and ends after you deliver the placenta. The placenta is an organ that develops  during pregnancy to provide oxygen and nutrients to your baby in the womb.  Delivering the placenta may require some pushing, and you may have mild contractions. Breastfeeding can stimulate contractions to help you deliver the placenta.  After the placenta is delivered, your uterus should tighten (contract) and become firm. This helps to stop bleeding in your uterus. To help your uterus contract and to control bleeding, your health care provider may: ? Give you medicine by injection, through an IV tube, by mouth, or through your rectum (rectally). ? Massage your abdomen or perform a vaginal exam to remove any blood clots that are left in your uterus. ? Empty your bladder by placing a thin, flexible tube (catheter) into your bladder. ? Encourage   you to breastfeed your baby. After labor is over, you and your baby will be monitored closely to ensure that you are both healthy until you are ready to go home. Your health care team will teach you how to care for yourself and your baby. This information is not intended to replace advice given to you by your health care provider. Make sure you discuss any questions you have with your health care provider. Document Released: 12/11/2007 Document Revised: 09/21/2015 Document Reviewed: 03/18/2015 Elsevier Interactive Patient Education  2018 [employer] Inc.  

## 2017-07-21 NOTE — Progress Notes (Signed)
ROB-Reports back pain. Discussed home treatment measures including use of birthing ball. SVE unchanged. Reviewed red flag symptoms and when to call. RTC x 1 week for ROB or sooner if needed.

## 2017-07-21 NOTE — Progress Notes (Signed)
Pt is here for an ROB visit. 

## 2017-07-28 ENCOUNTER — Ambulatory Visit (INDEPENDENT_AMBULATORY_CARE_PROVIDER_SITE_OTHER): Payer: BLUE CROSS/BLUE SHIELD | Admitting: Certified Nurse Midwife

## 2017-07-28 VITALS — BP 122/76 | HR 76 | Wt 188.4 lb

## 2017-07-28 DIAGNOSIS — Z3493 Encounter for supervision of normal pregnancy, unspecified, third trimester: Secondary | ICD-10-CM

## 2017-07-28 LAB — POCT URINALYSIS DIPSTICK
BILIRUBIN UA: NEGATIVE
GLUCOSE UA: NEGATIVE
KETONES UA: NEGATIVE
Leukocytes, UA: NEGATIVE
Nitrite, UA: NEGATIVE
Protein, UA: NEGATIVE
RBC UA: NEGATIVE
SPEC GRAV UA: 1.01 (ref 1.010–1.025)
Urobilinogen, UA: 0.2 E.U./dL
pH, UA: 7 (ref 5.0–8.0)

## 2017-07-28 NOTE — Progress Notes (Signed)
ROB,doing well, good FM.  C/o Irregular contractions Labor precautions reviewed. Red flag symptoms discussed and when to call. SVE today 1/50/-3  Schedule for IOL next visit. RTC in 1 week.    Shanika Creacy,SNM/Alessandria Henken,CNM

## 2017-07-28 NOTE — Patient Instructions (Signed)
Braxton Hicks Contractions °Contractions of the uterus can occur throughout pregnancy, but they are not always a sign that you are in labor. You may have practice contractions called Braxton Hicks contractions. These false labor contractions are sometimes confused with true labor. °What are Braxton Hicks contractions? °Braxton Hicks contractions are tightening movements that occur in the muscles of the uterus before labor. Unlike true labor contractions, these contractions do not result in opening (dilation) and thinning of the cervix. Toward the end of pregnancy (32-34 weeks), Braxton Hicks contractions can happen more often and may become stronger. These contractions are sometimes difficult to tell apart from true labor because they can be very uncomfortable. You should not feel embarrassed if you go to the hospital with false labor. °Sometimes, the only way to tell if you are in true labor is for your health care provider to look for changes in the cervix. The health care provider will do a physical exam and may monitor your contractions. If you are not in true labor, the exam should show that your cervix is not dilating and your water has not broken. °If there are other health problems associated with your pregnancy, it is completely safe for you to be sent home with false labor. You may continue to have Braxton Hicks contractions until you go into true labor. °How to tell the difference between true labor and false labor °True labor °· Contractions last 30-70 seconds. °· Contractions become very regular. °· Discomfort is usually felt in the top of the uterus, and it spreads to the lower abdomen and low back. °· Contractions do not go away with walking. °· Contractions usually become more intense and increase in frequency. °· The cervix dilates and gets thinner. °False labor °· Contractions are usually shorter and not as strong as true labor contractions. °· Contractions are usually irregular. °· Contractions  are often felt in the front of the lower abdomen and in the groin. °· Contractions may go away when you walk around or change positions while lying down. °· Contractions get weaker and are shorter-lasting as time goes on. °· The cervix usually does not dilate or become thin. °Follow these instructions at home: °· Take over-the-counter and prescription medicines only as told by your health care provider. °· Keep up with your usual exercises and follow other instructions from your health care provider. °· Eat and drink lightly if you think you are going into labor. °· If Braxton Hicks contractions are making you uncomfortable: °? Change your position from lying down or resting to walking, or change from walking to resting. °? Sit and rest in a tub of warm water. °? Drink enough fluid to keep your urine pale yellow. Dehydration may cause these contractions. °? Do slow and deep breathing several times an hour. °· Keep all follow-up prenatal visits as told by your health care provider. This is important. °Contact a health care provider if: °· You have a fever. °· You have continuous pain in your abdomen. °Get help right away if: °· Your contractions become stronger, more regular, and closer together. °· You have fluid leaking or gushing from your vagina. °· You pass blood-tinged mucus (bloody show). °· You have bleeding from your vagina. °· You have low back pain that you never had before. °· You feel your baby’s head pushing down and causing pelvic pressure. °· Your baby is not moving inside you as much as it used to. °Summary °· Contractions that occur before labor are called Braxton   Hicks contractions, false labor, or practice contractions. °· Braxton Hicks contractions are usually shorter, weaker, farther apart, and less regular than true labor contractions. True labor contractions usually become progressively stronger and regular and they become more frequent. °· Manage discomfort from Braxton Hicks contractions by  changing position, resting in a warm bath, drinking plenty of water, or practicing deep breathing. °This information is not intended to replace advice given to you by your health care provider. Make sure you discuss any questions you have with your health care provider. °Document Released: 07/17/2016 Document Revised: 07/17/2016 Document Reviewed: 07/17/2016 °Elsevier Interactive Patient Education © 2018 [employer] ° °

## 2017-07-28 NOTE — Progress Notes (Signed)
Pt is here for an ROB visit. 

## 2017-07-30 ENCOUNTER — Observation Stay
Admission: EM | Admit: 2017-07-30 | Discharge: 2017-07-30 | Disposition: A | Discharge status: Home / Self Care | Payer: Medicaid Other | Attending: Certified Nurse Midwife | Admitting: Certified Nurse Midwife

## 2017-07-30 ENCOUNTER — Other Ambulatory Visit: Payer: Self-pay

## 2017-07-30 DIAGNOSIS — M549 Dorsalgia, unspecified: Secondary | ICD-10-CM | POA: Diagnosis not present

## 2017-07-30 DIAGNOSIS — B951 Streptococcus, group B, as the cause of diseases classified elsewhere: Secondary | ICD-10-CM

## 2017-07-30 DIAGNOSIS — O26893 Other specified pregnancy related conditions, third trimester: Secondary | ICD-10-CM | POA: Diagnosis not present

## 2017-07-30 DIAGNOSIS — Z3A39 39 weeks gestation of pregnancy: Secondary | ICD-10-CM | POA: Insufficient documentation

## 2017-07-30 DIAGNOSIS — Z79899 Other long term (current) drug therapy: Secondary | ICD-10-CM | POA: Diagnosis not present

## 2017-07-30 LAB — URINALYSIS, COMPLETE (UACMP) WITH MICROSCOPIC
BILIRUBIN URINE: NEGATIVE
GLUCOSE, UA: NEGATIVE mg/dL
HGB URINE DIPSTICK: NEGATIVE
Ketones, ur: NEGATIVE mg/dL
NITRITE: NEGATIVE
PROTEIN: NEGATIVE mg/dL
Specific Gravity, Urine: 1.005 (ref 1.005–1.030)
pH: 7 (ref 5.0–8.0)

## 2017-07-30 MED ORDER — ACETAMINOPHEN 325 MG PO TABS
650.0000 mg | ORAL_TABLET | Freq: Once | ORAL | Status: AC
  Administered 2017-07-30: 650 mg via ORAL
  Filled 2017-07-30: qty 2

## 2017-07-30 NOTE — OB Triage Note (Signed)
   L&D OB Triage Note  SUBJECTIVE [name] [name] is a 22 y.o. G79P1001 female at [redacted]w[redacted]d, EDD Estimated Date of Delivery: 08/04/17 who presented to triage with complaints of back pain .Feels good movement. Denies contractions and LOF.   OB History  Gravida Para Term Preterm AB Living  0 0 1  SAB TAB Ectopic Multiple Live Births  0 0 0 0 1    # Outcome Date GA Lbr Len/2nd Weight Sex Delivery Anes PTL Lv  2 Current           1 Term 06/06/16 [redacted]w[redacted]d   M Vag-Spont EPI  LIV    Medications Prior to Admission  Medication Sig Dispense Refill Last Dose  . albuterol (PROAIR HFA) 108 (90 Base) MCG/ACT inhaler Inhale into the lungs.   Taking  . Prenatal Vit-Fe Fumarate-FA (PRENATAL MULTIVITAMIN) TABS tablet Take 1 tablet by mouth daily at 12 noon.   Taking     OBJECTIVE  Nursing Evaluation:   BP 129/79 (BP Location: Right Arm)   Pulse (!) 105   Temp 98.6 F (37 C) (Oral)   Resp 18   Ht  (1.6 m)   Wt 188 lb 6 oz (85.4 kg)   LMP 10/27/2016 (Approximate)   BMI 33.37 kg/m    Findings:   Back pain   NST was performed and has been reviewed by me.  NST INTERPRETATION: Category I  Mode: External Baseline Rate (A): 135 bpm Variability: Moderate Accelerations: 15 x 15       Contraction Frequency (min): none, UI   U/A negative  ASSESSMENT Impression:  1.  Pregnancy:  G2P1001 at [redacted]w[redacted]d , EDD Estimated Date of Delivery: 08/04/17 2.  NST:  Category I  PLAN 1. Reassurance given 2. Discharge home with standard labor precautions given to return to L&D or call the office for problems. 3. Continue routine prenatal care.  Doreene Burke, CNM

## 2017-07-30 NOTE — Discharge Instructions (Signed)
Back Pain in Pregnancy Back pain during pregnancy is common. Back pain may be caused by several factors that are related to changes during your pregnancy. Follow these instructions at home: Managing pain, stiffness, and swelling  If directed, apply ice for sudden (acute) back pain. ? Put ice in a plastic bag. ? Place a towel between your skin and the bag. ? Leave the ice on for 20 minutes, 2-3 times per day.  If directed, apply heat to the affected area before you exercise: ? Place a towel between your skin and the heat pack or heating pad. ? Leave the heat on for 20-30 minutes. ? Remove the heat if your skin turns bright red. This is especially important if you are unable to feel pain, heat, or cold. You may have a greater risk of getting burned. Activity  Exercise as told by your health care provider. Exercising is the best way to prevent or manage back pain.  Listen to your body when lifting. If lifting hurts, ask for help or bend your knees. This uses your leg muscles instead of your back muscles.  Squat down when picking up something from the floor. Do not bend over.  Only use bed rest as told by your health care provider. Bed rest should only be used for the most severe episodes of back pain. Standing, Sitting, and Lying Down  Do not stand in one place for long periods of time.  Use good posture when sitting. Make sure your head rests over your shoulders and is not hanging forward. Use a pillow on your lower back if necessary.  Try sleeping on your side, preferably the left side, with a pillow or two between your legs. If you are sore after a night's rest, your bed may be too soft. A firm mattress may provide more support for your back during pregnancy. General instructions  Do not wear high heels.  Eat a healthy diet. Try to gain weight within your health care provider's recommendations.  Use a maternity girdle, elastic sling, or back brace as told by your health care  provider.  Take over-the-counter and prescription medicines only as told by your health care provider.  Keep all follow-up visits as told by your health care provider. This is important. This includes any visits with any specialists, such as a physical therapist. Contact a health care provider if:  Your back pain interferes with your daily activities.  You have increasing pain in other parts of your body. Get help right away if:  You develop numbness, tingling, weakness, or problems with the use of your arms or legs.  You develop severe back pain that is not controlled with medicine.  You have a sudden change in bowel or bladder control.  You develop shortness of breath, dizziness, or you faint.  You develop nausea, vomiting, or sweating.  You have back pain that is a rhythmic, cramping pain similar to labor pains. Labor pain is usually 1-2 minutes apart, lasts for about 1 minute, and involves a bearing down feeling or pressure in your pelvis.  You have back pain and your water breaks or you have vaginal bleeding.  You have back pain or numbness that travels down your leg.  Your back pain developed after you fell.  You develop pain on one side of your back.  You see blood in your urine.  You develop skin blisters in the area of your back pain. This information is not intended to replace advice given to you   by your health care provider. Make sure you discuss any questions you have with your health care provider. Document Released: 06/11/2005 Document Revised: 08/09/2015 Document Reviewed: 11/15/2014 Elsevier Interactive Patient Education  2018 [employer]  

## 2017-07-30 NOTE — OB Triage Note (Signed)
Ms. [name] [name] here with c/o lower back pain, constant in nature, negative for flank pain and lower abdominal pain last night. Denies bleeding, LOF, reports positive fetal movement.

## 2017-08-04 ENCOUNTER — Inpatient Hospital Stay: Payer: Medicaid Other | Admitting: Anesthesiology

## 2017-08-04 ENCOUNTER — Other Ambulatory Visit: Payer: Self-pay

## 2017-08-04 ENCOUNTER — Inpatient Hospital Stay
Admission: EM | Admit: 2017-08-04 | Discharge: 2017-08-06 | DRG: 807 | Disposition: A | Discharge status: Home / Self Care | Payer: Medicaid Other | Attending: Certified Nurse Midwife | Admitting: Certified Nurse Midwife

## 2017-08-04 ENCOUNTER — Ambulatory Visit (INDEPENDENT_AMBULATORY_CARE_PROVIDER_SITE_OTHER): Payer: BLUE CROSS/BLUE SHIELD | Admitting: Certified Nurse Midwife

## 2017-08-04 ENCOUNTER — Encounter: Payer: Self-pay | Admitting: *Deleted

## 2017-08-04 VITALS — BP 117/79 | HR 91 | Wt 188.5 lb

## 2017-08-04 DIAGNOSIS — B951 Streptococcus, group B, as the cause of diseases classified elsewhere: Secondary | ICD-10-CM | POA: Diagnosis present

## 2017-08-04 DIAGNOSIS — O99824 Streptococcus B carrier state complicating childbirth: Principal | ICD-10-CM

## 2017-08-04 DIAGNOSIS — Z3493 Encounter for supervision of normal pregnancy, unspecified, third trimester: Secondary | ICD-10-CM | POA: Diagnosis not present

## 2017-08-04 DIAGNOSIS — Z3A40 40 weeks gestation of pregnancy: Secondary | ICD-10-CM | POA: Diagnosis not present

## 2017-08-04 DIAGNOSIS — J45909 Unspecified asthma, uncomplicated: Secondary | ICD-10-CM | POA: Diagnosis present

## 2017-08-04 DIAGNOSIS — Z3483 Encounter for supervision of other normal pregnancy, third trimester: Secondary | ICD-10-CM | POA: Diagnosis not present

## 2017-08-04 DIAGNOSIS — O9952 Diseases of the respiratory system complicating childbirth: Secondary | ICD-10-CM | POA: Diagnosis present

## 2017-08-04 DIAGNOSIS — O99324 Drug use complicating childbirth: Secondary | ICD-10-CM | POA: Diagnosis present

## 2017-08-04 DIAGNOSIS — F1290 Cannabis use, unspecified, uncomplicated: Secondary | ICD-10-CM | POA: Diagnosis not present

## 2017-08-04 DIAGNOSIS — O09892 Supervision of other high risk pregnancies, second trimester: Secondary | ICD-10-CM

## 2017-08-04 LAB — URINE DRUG SCREEN, QUALITATIVE (ARMC ONLY)
Amphetamines, Ur Screen: NOT DETECTED
Barbiturates, Ur Screen: NOT DETECTED
Benzodiazepine, Ur Scrn: NOT DETECTED
Cannabinoid 50 Ng, Ur ~~LOC~~: NOT DETECTED
Cocaine Metabolite,Ur ~~LOC~~: NOT DETECTED
MDMA (Ecstasy)Ur Screen: NOT DETECTED
Methadone Scn, Ur: NOT DETECTED
Opiate, Ur Screen: NOT DETECTED
Phencyclidine (PCP) Ur S: NOT DETECTED
Tricyclic, Ur Screen: NOT DETECTED

## 2017-08-04 LAB — CBC
HCT: 33.7 % — ABNORMAL LOW (ref 35.0–47.0)
Hemoglobin: 11.3 g/dL — ABNORMAL LOW (ref 12.0–16.0)
MCH: 24.1 pg — ABNORMAL LOW (ref 26.0–34.0)
MCHC: 33.5 g/dL (ref 32.0–36.0)
MCV: 72 fL — ABNORMAL LOW (ref 80.0–100.0)
Platelets: 200 10*3/uL (ref 150–440)
RBC: 4.69 MIL/uL (ref 3.80–5.20)
RDW: 16.2 % — ABNORMAL HIGH (ref 11.5–14.5)
WBC: 10.5 10*3/uL (ref 3.6–11.0)

## 2017-08-04 LAB — POCT URINALYSIS DIPSTICK
Bilirubin, UA: NEGATIVE
Blood, UA: NEGATIVE
GLUCOSE UA: NEGATIVE
Ketones, UA: NEGATIVE
LEUKOCYTES UA: NEGATIVE
Nitrite, UA: NEGATIVE
Protein, UA: NEGATIVE
Spec Grav, UA: 1.01 (ref 1.010–1.025)
Urobilinogen, UA: 0.2 E.U./dL
pH, UA: 7.5 (ref 5.0–8.0)

## 2017-08-04 LAB — TYPE AND SCREEN
ABO/RH(D): O POS
Antibody Screen: NEGATIVE

## 2017-08-04 MED ORDER — ONDANSETRON HCL 4 MG/2ML IJ SOLN
4.0000 mg | Freq: Four times a day (QID) | INTRAMUSCULAR | Status: DC | PRN
  Administered 2017-08-04: 4 mg via INTRAVENOUS
  Filled 2017-08-04: qty 2

## 2017-08-04 MED ORDER — ACETAMINOPHEN 500 MG PO TABS
1000.0000 mg | ORAL_TABLET | Freq: Four times a day (QID) | ORAL | Status: DC | PRN
  Administered 2017-08-05 – 2017-08-06 (×5): 1000 mg via ORAL
  Filled 2017-08-04 (×6): qty 2

## 2017-08-04 MED ORDER — LACTATED RINGERS IV SOLN: 500.0000 mL | INTRAVENOUS | Status: DC | PRN

## 2017-08-04 MED ORDER — EPHEDRINE 5 MG/ML INJ: 10.0000 mg | INTRAVENOUS | Status: DC | PRN

## 2017-08-04 MED ORDER — PENICILLIN G POT IN DEXTROSE 60000 UNIT/ML IV SOLN
3.0000 10*6.[IU] | INTRAVENOUS | Status: DC
  Administered 2017-08-04: 3 10*6.[IU] via INTRAVENOUS
  Filled 2017-08-04 (×6): qty 50

## 2017-08-04 MED ORDER — PHENYLEPHRINE 40 MCG/ML (10ML) SYRINGE FOR IV PUSH (FOR BLOOD PRESSURE SUPPORT): 80.0000 ug | PREFILLED_SYRINGE | INTRAVENOUS | Status: DC | PRN

## 2017-08-04 MED ORDER — LIDOCAINE HCL (PF) 1 % IJ SOLN
30.0000 mL | INTRAMUSCULAR | Status: AC | PRN
  Administered 2017-08-04: 1.2 mL via SUBCUTANEOUS
  Filled 2017-08-04: qty 30

## 2017-08-04 MED ORDER — LIDOCAINE-EPINEPHRINE (PF) 1.5 %-1:200000 IJ SOLN
INTRAMUSCULAR | Status: DC | PRN
  Administered 2017-08-04: 3 mL via EPIDURAL

## 2017-08-04 MED ORDER — BUTORPHANOL TARTRATE 2 MG/ML IJ SOLN: 1.0000 mg | INTRAMUSCULAR | Status: DC | PRN

## 2017-08-04 MED ORDER — ACETAMINOPHEN 325 MG PO TABS
650.0000 mg | ORAL_TABLET | ORAL | Status: DC | PRN
  Administered 2017-08-04: 650 mg via ORAL
  Filled 2017-08-04: qty 2

## 2017-08-04 MED ORDER — AMMONIA AROMATIC IN INHA
RESPIRATORY_TRACT | Status: AC
  Filled 2017-08-04: qty 10

## 2017-08-04 MED ORDER — OXYTOCIN BOLUS FROM INFUSION
500.0000 mL | Freq: Once | INTRAVENOUS | Status: AC
  Administered 2017-08-04: 500 mL via INTRAVENOUS

## 2017-08-04 MED ORDER — OXYCODONE-ACETAMINOPHEN 5-325 MG PO TABS: 1.0000 | ORAL_TABLET | ORAL | Status: DC | PRN

## 2017-08-04 MED ORDER — SODIUM CHLORIDE 0.9 % IV SOLN
5.0000 10*6.[IU] | Freq: Once | INTRAVENOUS | Status: AC
  Administered 2017-08-04: 5 10*6.[IU] via INTRAVENOUS
  Filled 2017-08-04: qty 5

## 2017-08-04 MED ORDER — OXYTOCIN 40 UNITS IN LACTATED RINGERS INFUSION - SIMPLE MED
1.0000 m[IU]/min | INTRAVENOUS | Status: DC
  Administered 2017-08-04: 2 m[IU]/min via INTRAVENOUS
  Filled 2017-08-04: qty 1000

## 2017-08-04 MED ORDER — FENTANYL 2.5 MCG/ML W/ROPIVACAINE 0.15% IN NS 100 ML EPIDURAL (ARMC)
EPIDURAL | Status: AC
  Filled 2017-08-04: qty 100

## 2017-08-04 MED ORDER — DIPHENHYDRAMINE HCL 50 MG/ML IJ SOLN: 12.5000 mg | INTRAMUSCULAR | Status: DC | PRN

## 2017-08-04 MED ORDER — FENTANYL 2.5 MCG/ML W/ROPIVACAINE 0.15% IN NS 100 ML EPIDURAL (ARMC)
EPIDURAL | Status: DC | PRN
  Administered 2017-08-04: 12 mL/h via EPIDURAL

## 2017-08-04 MED ORDER — SOD CITRATE-CITRIC ACID 500-334 MG/5ML PO SOLN: 30.0000 mL | ORAL | Status: DC | PRN

## 2017-08-04 MED ORDER — LACTATED RINGERS IV SOLN
INTRAVENOUS | Status: DC
  Administered 2017-08-04: 1000 mL via INTRAVENOUS

## 2017-08-04 MED ORDER — OXYTOCIN 40 UNITS IN LACTATED RINGERS INFUSION - SIMPLE MED: 2.5000 [IU]/h | INTRAVENOUS | Status: DC

## 2017-08-04 MED ORDER — OXYCODONE-ACETAMINOPHEN 5-325 MG PO TABS: 2.0000 | ORAL_TABLET | ORAL | Status: DC | PRN

## 2017-08-04 MED ORDER — FENTANYL 2.5 MCG/ML W/ROPIVACAINE 0.15% IN NS 100 ML EPIDURAL (ARMC): 12.0000 mL/h | EPIDURAL | Status: DC

## 2017-08-04 MED ORDER — FLEET ENEMA 7-19 GM/118ML RE ENEM: 1.0000 | ENEMA | Freq: Every day | RECTAL | Status: DC | PRN

## 2017-08-04 MED ORDER — LACTATED RINGERS IV SOLN: 500.0000 mL | Freq: Once | INTRAVENOUS | Status: DC

## 2017-08-04 MED ORDER — TERBUTALINE SULFATE 1 MG/ML IJ SOLN: 0.2500 mg | Freq: Once | INTRAMUSCULAR | Status: DC | PRN

## 2017-08-04 MED ORDER — MISOPROSTOL 50MCG HALF TABLET
50.0000 ug | ORAL_TABLET | ORAL | Status: DC
  Administered 2017-08-04: 50 ug via ORAL
  Filled 2017-08-04: qty 1

## 2017-08-04 MED ORDER — MISOPROSTOL 200 MCG PO TABS
ORAL_TABLET | ORAL | Status: AC
Start: 2017-08-04 — End: 2017-08-05
  Filled 2017-08-04: qty 4

## 2017-08-04 MED ORDER — OXYTOCIN 10 UNIT/ML IJ SOLN
INTRAMUSCULAR | Status: AC
  Filled 2017-08-04: qty 2

## 2017-08-04 MED ORDER — ZOLPIDEM TARTRATE 5 MG PO TABS: 5.0000 mg | ORAL_TABLET | Freq: Every evening | ORAL | Status: DC | PRN

## 2017-08-04 NOTE — H&P (Signed)
Obstetric History and Physical  [name] [name] is a 22 y.o. G2P1001 with IUP at [redacted]w[redacted]d presenting with leakage of fluid since office visit. Patient states she has been having  Irregular contractions, none vaginal bleeding, ruptured, clear fluid membranes, with active fetal movement.    Denies difficulty breathing or respiratory distress, chest pain, abdominal pain, dysuria, and leg pain or swelling.   Prenatal Course  Source of Care: [facility], first visit at 12 weeks, total visits: 14  Pregnancy complications or risks: short interval pregnancy, positive drug screen-marijuana  Prenatal labs and studies:  ABO, Rh: --/--/O POS (05/21 1424)  Antibody: NEG (05/21 1424)  Rubella: 6.48 (10/15 1445)  RPR: Non Reactive (03/01 1120)   Varicella: 341 (10/15 1445)  HBsAg: Negative (10/15 1445)   HIV: Non Reactive (10/15 1445)   RUE:[id number] (04/23 1425)  1 hr Glucola: 98 (03/01 1120)  Genetic screening: Normal (11/16 1422)  Anatomy US: Complete, normal (01/10 1250)  Past Medical History:  Diagnosis Date  . Abdominal pain   . Anal fissure   . Asthma   . Constipation   . H/O chlamydia infection 10/30/2015  . Headache     Past Surgical History:  Procedure Laterality Date  . KNEE SURGERY Left 2014, 2015    OB History  Gravida Para Term Preterm AB Living  SAB TAB Ectopic Multiple Live Births          1    # Outcome Date GA Lbr Len/2nd Weight Sex Delivery Anes PTL Lv  2 Current           1 Term 06/06/16 [redacted]w[redacted]d   M Vag-Spont EPI  LIV    Social History   Socioeconomic History  . Marital status: Single    Spouse name: Not on file  . Number of children: Not on file  . Years of education: Not on file  . Highest education level: Not on file  Occupational History  . Not on file  Social Needs  . Financial resource strain: Not on file  . Food insecurity:    Worry: Not on file    Inability: Not on file  . Transportation needs:    Medical: Not on file   Non-medical: Not on file  Tobacco Use  . Smoking status: Never Smoker  . Smokeless tobacco: Never Used  Substance and Sexual Activity  . Alcohol use: No  . Drug use: No  . Sexual activity: Yes    Partners: Male  Lifestyle  . Physical activity:    Days per week: Not on file    Minutes per session: Not on file  . Stress: Not on file  Relationships  . Social connections:    Talks on phone: Not on file    Gets together: Not on file    Attends religious service: Not on file    Active member of club or organization: Not on file    Attends meetings of clubs or organizations: Not on file    Relationship status: Not on file  Other Topics Concern  . Not on file  Social History Narrative  . Not on file    Family History  Problem Relation Age of Onset  . Cancer Sister   . Seizures Sister   . Migraines Mother   . Diabetes Paternal Grandmother   . Celiac disease Neg Hx   . Cholelithiasis Neg Hx   . Ulcers Neg Hx     Medications Prior to  Admission  Medication Sig Dispense Refill Last Dose  . albuterol (PROAIR HFA) 108 (90 Base) MCG/ACT inhaler Inhale into the lungs.   Past Month  . Prenatal Vit-Fe Fumarate-FA (PRENATAL MULTIVITAMIN) TABS tablet Take 1 tablet by mouth daily at 12 noon.   08/04/2017    Allergies  Allergen Reactions  . Ibuprofen Nausea Only and Other (See Comments)    ibuprofen 800 mg - Vomiting and Shaking  . Naproxen Itching and Nausea Only    Review of Systems: Negative except for what is mentioned in HPI.  Physical Exam:  Temp:  [98.1 F (36.7 C)-98.3 F (36.8 C)] 98.3 F (36.8 C) (05/21 1739) Pulse Rate:  [91-105] 105 (05/21 1403) Resp:  [16-18] 18 (05/21 1739) BP: (117-140)/(66-79) 140/66 (05/21 1403) Weight:  [188 lb 8 oz (85.5 kg)-189 lb (85.7 kg)] 189 lb (85.7 kg) (05/21 1403)  GENERAL: Well-developed, well-nourished female in no acute distress.   LUNGS: Clear to auscultation bilaterally.   HEART: Regular rate and rhythm.  ABDOMEN: Soft,  nontender, nondistended, gravid.  EXTREMITIES: Nontender, no edema, 2+ distal pulses.  Cervical Exam: Dilation: 3.5 Presentation: Vertex Exam by:: Taima Rada CNM  FHT:  Baseline rate 135 bpm   Variability moderate  Accelerations present   Decelerations none  Contractions: Every three (3) to four (4) minutes, soft resting tone   Pertinent Labs/Studies:    Results for orders placed or performed during the hospital encounter of 08/04/17 (from the past 24 hour(s))  CBC     Status: Abnormal   Collection Time: 08/04/17  2:22 PM  Result Value Ref Range   WBC 10.5 3.6 - 11.0 K/uL   RBC 4.69 3.80 - 5.20 MIL/uL   Hemoglobin 11.3 (L) 12.0 - 16.0 g/dL   HCT 82.9 (L) 56.2 - 13.0 %   MCV 72.0 (L) 80.0 - 100.0 fL   MCH 24.1 (L) 26.0 - 34.0 pg   MCHC 33.5 32.0 - 36.0 g/dL   RDW 86.5 (H) 78.4 - 69.6 %   Platelets 200 150 - 440 K/uL  Type and screen     Status: None   Collection Time: 08/04/17  2:24 PM  Result Value Ref Range   ABO/RH(D) O POS    Antibody Screen NEG    Sample Expiration      08/07/2017 Performed at [facility], [address] [place], [place] [address]     Assessment :  [name] [name] is a 22 y.o. G2P1001 at [redacted]w[redacted]d being admitted for rupture of membranes at term, Rh positive, GBS positive, short interval pregnancy, positive drug screen-marijuana  FHR Category I  Plan:  Admit to birthing suites, see orders.   Labor: Expectant management.  Induction/Augmentation as needed, per protocol  Reviewed red flag symptoms and when to call.   Delivery plan: Hopeful for vaginal delivery  Dr. Logan Bores notified of admission and plan of care.    [name], CNM [facility], [facility]

## 2017-08-04 NOTE — Anesthesia Preprocedure Evaluation (Signed)
Anesthesia Evaluation  Patient identified by MRN, date of birth, ID band Patient awake    Reviewed: Allergy & Precautions, NPO status , Patient's Chart, lab work & pertinent test results  History of Anesthesia Complications Negative for: history of anesthetic complications  Airway Mallampati: II       Dental   Pulmonary asthma , neg sleep apnea, neg COPD,           Cardiovascular (-) hypertension(-) Past MI and (-) CHF (-) dysrhythmias (-) Valvular Problems/Murmurs     Neuro/Psych neg Seizures Depression    GI/Hepatic Neg liver ROS, GERD  ,  Endo/Other  neg diabetes  Renal/GU negative Renal ROS     Musculoskeletal   Abdominal   Peds  Hematology   Anesthesia Other Findings   Reproductive/Obstetrics (+) Pregnancy                             Anesthesia Physical Anesthesia Plan  ASA: II  Anesthesia Plan: Epidural   Post-op Pain Management:    Induction:   PONV Risk Score and Plan:   Airway Management Planned:   Additional Equipment:   Intra-op Plan:   Post-operative Plan:   Informed Consent: I have reviewed the patients History and Physical, chart, labs and discussed the procedure including the risks, benefits and alternatives for the proposed anesthesia with the patient or authorized representative who has indicated his/her understanding and acceptance.     Plan Discussed with:   Anesthesia Plan Comments:         Anesthesia Quick Evaluation

## 2017-08-04 NOTE — Progress Notes (Signed)
Pt is here for an ROB visit. 

## 2017-08-04 NOTE — Progress Notes (Signed)
Patient ID: [name] [name], female   DOB: 1995/09/12, 22 y.o.   MRN: [id number]   [name] [name] is a 22 y.o. G2P1001 at [redacted]w[redacted]d by ultrasound admitted for rupture of membranes  Subjective:  Doing well, reports pain relief since epidural placement; however, notes intermittent pelvic pressure.   Denies difficulty breathing or respiratory distress, chest pain, abdominal pain, vaginal bleeding, and leg pain or swelling.   Objective:  Temp:  [97.5 F (36.4 C)-98.3 F (36.8 C)] 97.8 F (36.6 C) (05/21 2147) Pulse Rate:  [77-105] 79 (05/21 2147) Resp:  [16-18] 18 (05/21 2147) BP: (109-140)/(50-79) 109/50 (05/21 2147) SpO2:  [98 %-100 %] 100 % (05/21 2017) Weight:  [188 lb 8 oz (85.5 kg)-189 lb (85.7 kg)] 189 lb (85.7 kg) (05/21 1403)  Fetal Wellbeing:  Category I  UC:   regular, every one (1) to four (4) minutes, soft resting tone  SVE:   Dilation: 7 Effacement (%): 80 Station: -1 Exam by:: M. Lawhorn,CNM  Labs: Lab Results  Component Value Date   WBC 10.5 08/04/2017   HGB 11.3 (L) 08/04/2017   HCT 33.7 (L) 08/04/2017   MCV 72.0 (L) 08/04/2017   PLT 200 08/04/2017    Assessment:  [name] [name] is a 22 y.o. G2P1001 at [redacted]w[redacted]d admitted for rupture of membranes at term, Rh positive, GBS positive, short interval pregnancy, positive drug screen-marijuana  FHR Category I  Plan:  Continue orders as written.   Reviewed red flag symptoms and when to call.   Reassess as needed.    [name], CNM [facility], [facility] 08/04/2017, 9:58 PM

## 2017-08-04 NOTE — Progress Notes (Signed)
ROB-Doing well, report irregular contractions. Request SVE. Leakage of thin clear fluid noted during SVE, nitrazine positive. Discussed probably AROM with exam. Patient to L&D for evaluation. Report called to Tresa Endo, L&D Consulting civil engineer. Reviewed red flag symptoms and when to call. Patient plans to eat lunch and then present to birth place. Orders signed and held.

## 2017-08-04 NOTE — Anesthesia Procedure Notes (Signed)
Epidural Patient location during procedure: OB Start time: 08/04/2017 7:55 PM End time: 08/04/2017 8:17 PM  Staffing Performed: anesthesiologist   Preanesthetic Checklist Completed: patient identified, site marked, surgical consent, pre-op evaluation, timeout performed, IV checked, risks and benefits discussed and monitors and equipment checked  Epidural Patient position: sitting Prep: Betadine Patient monitoring: heart rate, continuous pulse ox and blood pressure Approach: midline Location: L4-L5 Injection technique: LOR saline  Needle:  Needle type: Tuohy  Needle gauge: 17 G Needle length: 9 cm and 9 Catheter type: closed end flexible Catheter size: 20 Guage Catheter at skin depth: 12 cm Test dose: negative and 1.5% lidocaine with Epi 1:200 K  Assessment Events: blood not aspirated, injection not painful, no injection resistance, negative IV test and no paresthesia  Additional Notes   Patient tolerated the insertion well without complications.Reason for block:procedure for pain

## 2017-08-05 ENCOUNTER — Encounter: Payer: Self-pay | Admitting: *Deleted

## 2017-08-05 LAB — CBC
HCT: 33.6 % — ABNORMAL LOW (ref 35.0–47.0)
Hemoglobin: 11.2 g/dL — ABNORMAL LOW (ref 12.0–16.0)
MCH: 24 pg — AB (ref 26.0–34.0)
MCHC: 33.2 g/dL (ref 32.0–36.0)
MCV: 72.4 fL — ABNORMAL LOW (ref 80.0–100.0)
Platelets: 185 10*3/uL (ref 150–440)
RBC: 4.65 MIL/uL (ref 3.80–5.20)
RDW: 16.4 % — ABNORMAL HIGH (ref 11.5–14.5)
WBC: 13.3 10*3/uL — ABNORMAL HIGH (ref 3.6–11.0)

## 2017-08-05 LAB — RPR: RPR Ser Ql: NONREACTIVE

## 2017-08-05 MED ORDER — BENZOCAINE-MENTHOL 20-0.5 % EX AERO: 1.0000 "application " | INHALATION_SPRAY | CUTANEOUS | Status: DC | PRN

## 2017-08-05 MED ORDER — DIPHENHYDRAMINE HCL 25 MG PO CAPS: 25.0000 mg | ORAL_CAPSULE | Freq: Four times a day (QID) | ORAL | Status: DC | PRN

## 2017-08-05 MED ORDER — ALBUTEROL SULFATE (2.5 MG/3ML) 0.083% IN NEBU: 2.5000 mg | INHALATION_SOLUTION | Freq: Four times a day (QID) | RESPIRATORY_TRACT | Status: DC | PRN

## 2017-08-05 MED ORDER — ONDANSETRON HCL 4 MG/2ML IJ SOLN: 4.0000 mg | INTRAMUSCULAR | Status: DC | PRN

## 2017-08-05 MED ORDER — LIDOCAINE 5 % EX PTCH
1.0000 | MEDICATED_PATCH | CUTANEOUS | Status: DC
  Administered 2017-08-05: 1 via TRANSDERMAL
  Filled 2017-08-05: qty 1

## 2017-08-05 MED ORDER — WITCH HAZEL-GLYCERIN EX PADS: 1.0000 "application " | MEDICATED_PAD | CUTANEOUS | Status: DC | PRN

## 2017-08-05 MED ORDER — PRENATAL MULTIVITAMIN CH
1.0000 | ORAL_TABLET | Freq: Every day | ORAL | Status: DC
  Administered 2017-08-05 – 2017-08-06 (×2): 1 via ORAL
  Filled 2017-08-05 (×2): qty 1

## 2017-08-05 MED ORDER — ZOLPIDEM TARTRATE 5 MG PO TABS: 5.0000 mg | ORAL_TABLET | Freq: Every evening | ORAL | Status: DC | PRN

## 2017-08-05 MED ORDER — SENNOSIDES-DOCUSATE SODIUM 8.6-50 MG PO TABS
2.0000 | ORAL_TABLET | ORAL | Status: DC
  Administered 2017-08-05 – 2017-08-06 (×3): 2 via ORAL
  Filled 2017-08-05 (×4): qty 2

## 2017-08-05 MED ORDER — SODIUM CHLORIDE FLUSH 0.9 % IV SOLN
INTRAVENOUS | Status: AC
  Administered 2017-08-05: 08:00:00
  Filled 2017-08-05: qty 10

## 2017-08-05 MED ORDER — DIBUCAINE 1 % RE OINT: 1.0000 "application " | TOPICAL_OINTMENT | RECTAL | Status: DC | PRN

## 2017-08-05 MED ORDER — DOCUSATE SODIUM 100 MG PO CAPS
ORAL_CAPSULE | ORAL | Status: AC
  Filled 2017-08-05: qty 2

## 2017-08-05 MED ORDER — COCONUT OIL OIL: 1.0000 "application " | TOPICAL_OIL | Status: DC | PRN

## 2017-08-05 MED ORDER — OXYCODONE HCL 5 MG PO TABS
10.0000 mg | ORAL_TABLET | ORAL | Status: DC | PRN
  Administered 2017-08-05 – 2017-08-06 (×4): 10 mg via ORAL
  Filled 2017-08-05 (×5): qty 2

## 2017-08-05 MED ORDER — MEDROXYPROGESTERONE ACETATE 150 MG/ML IM SUSP: 150.0000 mg | INTRAMUSCULAR | Status: DC | PRN

## 2017-08-05 MED ORDER — ALBUTEROL SULFATE HFA 108 (90 BASE) MCG/ACT IN AERS: 1.0000 | INHALATION_SPRAY | Freq: Four times a day (QID) | RESPIRATORY_TRACT | Status: DC | PRN

## 2017-08-05 MED ORDER — OXYCODONE HCL 5 MG PO TABS
5.0000 mg | ORAL_TABLET | ORAL | Status: DC | PRN
  Administered 2017-08-05 (×4): 5 mg via ORAL
  Filled 2017-08-05 (×4): qty 1

## 2017-08-05 MED ORDER — ONDANSETRON HCL 4 MG PO TABS: 4.0000 mg | ORAL_TABLET | ORAL | Status: DC | PRN

## 2017-08-05 MED ORDER — SIMETHICONE 80 MG PO CHEW: 80.0000 mg | CHEWABLE_TABLET | ORAL | Status: DC | PRN

## 2017-08-05 NOTE — Anesthesia Postprocedure Evaluation (Signed)
Anesthesia Post Note  Patient: [name] [name]  Procedure(s) Performed: AN AD HOC LABOR EPIDURAL  Patient location during evaluation: L&D Anesthesia Type: Epidural Level of consciousness: awake, awake and alert and oriented Pain management: pain level controlled Vital Signs Assessment: post-procedure vital signs reviewed and stable Respiratory status: spontaneous breathing, nonlabored ventilation and respiratory function stable Cardiovascular status: blood pressure returned to baseline and stable Postop Assessment: no headache and no backache Anesthetic complications: no     Last Vitals:  Vitals:   08/05/17 0144 08/05/17 0410  BP: (!) 122/54 132/71  Pulse: 92 71  Resp:    Temp:  37.1 C  SpO2:      Last Pain:  Vitals:   08/05/17 0600  TempSrc:   PainSc: 7                  Chiropodist

## 2017-08-05 NOTE — Progress Notes (Signed)
Patient ID: [name] [name], female   DOB: 1996-01-24, 22 y.o.   MRN: [id number]  Post Partum Day # 1, s/p SVD  Subjective:  Doing well, no questions or concerns. Uncertain about postpartum contraception at this time. Friends and family at bedside holding infant.   Denies difficulty breathing or respiratory distress, chest pain, abdominal pain, excessive vaginal bleeding, dysuria, and leg pain or swelling.   Objective:  Temp:  [97.5 F (36.4 C)-98.8 F (37.1 C)] 98 F (36.7 C) (05/22 1622) Pulse Rate:  [60-92] 64 (05/22 1622) Resp:  [18-20] 18 (05/22 1622) BP: (109-135)/(49-97) 129/78 (05/22 1622) SpO2:  [98 %-100 %] 98 % (05/22 1622)  Physical Exam:   General: alert and cooperative   Lungs: clear to auscultation bilaterally  Breasts: deferred, no complaints  Heart: regular rate and rhythm  Abdomen: soft, non-tender; bowel sounds normal; no masses,  no organomegaly  Pelvis: Lochia: appropriate, Uterine Fundus: firm  Extremities: DVT Evaluation: No evidence of DVT seen on physical exam. Negative Homan's sign.  Recent Labs    08/04/17 1422 08/05/17 0559  HGB 11.3* 11.2*  HCT 33.7* 33.6*    Assessment/Plan: Plan for discharge tomorrow   LOS: 1 day    [name], CNM [facility] 08/05/2017 5:14 PM

## 2017-08-06 MED ORDER — MEDROXYPROGESTERONE ACETATE 150 MG/ML IM SUSP: 150.0000 mg | INTRAMUSCULAR | 3 refills | Status: DC

## 2017-08-06 MED ORDER — OXYCODONE HCL 10 MG PO TABS: 10.0000 mg | ORAL_TABLET | ORAL | 0 refills | Status: DC | PRN

## 2017-08-06 NOTE — Progress Notes (Signed)
Patient discharged home with infant. Discharge instructions, prescriptions and follow up appointment given to and reviewed with patient. Patient verbalized understanding. Patient wheeled out with infant by auxiliary.  

## 2017-08-06 NOTE — Discharge Instructions (Signed)
Vaginal Delivery, Care After °Refer to this sheet in the next few weeks. These instructions provide you with information about caring for yourself after vaginal delivery. Your health care provider may also give you more specific instructions. Your treatment has been planned according to current medical practices, but problems sometimes occur. Call your health care provider if you have any problems or questions. °What can I expect after the procedure? °After vaginal delivery, it is common to have: °· Some bleeding from your vagina. °· Soreness in your abdomen, your vagina, and the area of skin between your vaginal opening and your anus (perineum). °· Pelvic cramps. °· Fatigue. ° °Follow these instructions at home: °Medicines °· Take over-the-counter and prescription medicines only as told by your health care provider. °· If you were prescribed an antibiotic medicine, take it as told by your health care provider. Do not stop taking the antibiotic until it is finished. °Driving ° °· Do not drive or operate heavy machinery while taking prescription pain medicine. °· Do not drive for 24 hours if you received a sedative. °Lifestyle °· Do not drink alcohol. This is especially important if you are breastfeeding or taking medicine to relieve pain. °· Do not use tobacco products, including cigarettes, chewing tobacco, or e-cigarettes. If you need help quitting, ask your health care provider. °Eating and drinking °· Drink at least 8 eight-ounce glasses of water every day unless you are told not to by your health care provider. If you choose to breastfeed your baby, you may need to drink more water than this. °· Eat high-fiber foods every day. These foods may help prevent or relieve constipation. High-fiber foods include: °? Whole grain cereals and breads. °? Brown rice. °? Beans. °? Fresh fruits and vegetables. °Activity °· Return to your normal activities as told by your health care provider. Ask your health care provider  what activities are safe for you. °· Rest as much as possible. Try to rest or take a nap when your baby is sleeping. °· Do not lift anything that is heavier than your baby or 10 lb (4.5 kg) until your health care provider says that it is safe. °· Talk with your health care provider about when you can engage in sexual activity. This may depend on your: °? Risk of infection. °? Rate of healing. °? Comfort and desire to engage in sexual activity. °Vaginal Care °· If you have an episiotomy or a vaginal tear, check the area every day for signs of infection. Check for: °? More redness, swelling, or pain. °? More fluid or blood. °? Warmth. °? Pus or a bad smell. °· Do not use tampons or douches until your health care provider says this is safe. °· Watch for any blood clots that may pass from your vagina. These may look like clumps of dark red, brown, or black discharge. °General instructions °· Keep your perineum clean and dry as told by your health care provider. °· Wear loose, comfortable clothing. °· Wipe from front to back when you use the toilet. °· Ask your health care provider if you can shower or take a bath. If you had an episiotomy or a perineal tear during labor and delivery, your health care provider may tell you not to take baths for a certain length of time. °· Wear a bra that supports your breasts and fits you well. °· If possible, have someone help you with household activities and help care for your baby for at least a few days after   you leave the hospital.  Keep all follow-up visits for you and your baby as told by your health care provider. This is important. Contact a health care provider if:  You have: ? Vaginal discharge that has a bad smell. ? Difficulty urinating. ? Pain when urinating. ? A sudden increase or decrease in the frequency of your bowel movements. ? More redness, swelling, or pain around your episiotomy or vaginal tear. ? More fluid or blood coming from your episiotomy or  vaginal tear. ? Pus or a bad smell coming from your episiotomy or vaginal tear. ? A fever. ? A rash. ? Little or no interest in activities you used to enjoy. ? Questions about caring for yourself or your baby.  Your episiotomy or vaginal tear feels warm to the touch.  Your episiotomy or vaginal tear is separating or does not appear to be healing.  Your breasts are painful, hard, or turn red.  You feel unusually sad or worried.  You feel nauseous or you vomit.  You pass large blood clots from your vagina. If you pass a blood clot from your vagina, save it to show to your health care provider. Do not flush blood clots down the toilet without having your health care provider look at them.  You urinate more than usual.  You are dizzy or light-headed.  You have not breastfed at all and you have not had a menstrual period for 12 weeks after delivery.  You have stopped breastfeeding and you have not had a menstrual period for 12 weeks after you stopped breastfeeding. Get help right away if:  You have: ? Pain that does not go away or does not get better with medicine. ? Chest pain. ? Difficulty breathing. ? Blurred vision or spots in your vision. ? Thoughts about hurting yourself or your baby.  You develop pain in your abdomen or in one of your legs.  You develop a severe headache.  You faint.  You bleed from your vagina so much that you fill two sanitary pads in one hour. This information is not intended to replace advice given to you by your health care provider. Make sure you discuss any questions you have with your health care provider. Document Released: 02/29/2000 Document Revised: 08/15/2015 Document Reviewed: 03/18/2015 Elsevier Interactive Patient Education  2018 [employer]. How to Take a [employer] A sitz bath is a warm water bath that is taken while you are sitting down. The water should only come up to your hips and should cover your buttocks. Your health care  provider may recommend a sitz bath to help you:  Clean the lower part of your body, including your genital area.  With itching.  With pain.  With sore muscles or muscles that tighten or spasm.  How to take a sitz bath Take 3-4 sitz baths per day or as told by your health care provider. 1. Partially fill a bathtub with warm water. You will only need the water to be deep enough to cover your hips and buttocks when you are sitting in it. 2. If your health care provider told you to put medicine in the water, follow the directions exactly. 3. Sit in the water and open the tub drain a little. 4. Turn on the warm water again to keep the tub at the correct level. Keep the water running constantly. 5. Soak in the water for 15-20 minutes or as told by your health care provider. 6. After the sitz bath, pat the  affected area dry first. Do not rub it. 7. Be careful when you stand up after the sitz bath because you may feel dizzy.  Contact a health care provider if:  Your symptoms get worse. Do not continue with sitz baths if your symptoms get worse.  You have new symptoms. Do not continue with sitz baths until you talk with your health care provider. This information is not intended to replace advice given to you by your health care provider. Make sure you discuss any questions you have with your health care provider. Document Released: 11/24/2003 Document Revised: 08/01/2015 Document Reviewed: 03/01/2014 Elsevier Interactive Patient Education  2018 [employer]. Postpartum Depression and Baby Blues The postpartum period begins right after the birth of a baby. During this time, there is often a great amount of joy and excitement. It is also a time of many changes in the life of the parents. Regardless of how many times a mother gives birth, each child brings new challenges and dynamics to the family. It is not unusual to have feelings of excitement along with confusing shifts in moods, emotions, and  thoughts. All mothers are at risk of developing postpartum depression or the "baby blues." These mood changes can occur right after giving birth, or they may occur many months after giving birth. The baby blues or postpartum depression can be mild or severe. Additionally, postpartum depression can go away rather quickly, or it can be a long-term condition. What are the causes? Raised hormone levels and the rapid drop in those levels are thought to be a main cause of postpartum depression and the baby blues. A number of hormones change during and after pregnancy. Estrogen and progesterone usually decrease right after the delivery of your baby. The levels of thyroid hormone and various cortisol steroids also rapidly drop. Other factors that play a role in these mood changes include major life events and genetics. What increases the risk? If you have any of the following risks for the baby blues or postpartum depression, know what symptoms to watch out for during the postpartum period. Risk factors that may increase the likelihood of getting the baby blues or postpartum depression include:  Having a personal or family history of depression.  Having depression while being pregnant.  Having premenstrual mood issues or mood issues related to oral contraceptives.  Having a lot of life stress.  Having marital conflict.  Lacking a social support network.  Having a baby with special needs.  Having health problems, such as diabetes.  What are the signs or symptoms? Symptoms of baby blues include:  Brief changes in mood, such as going from extreme happiness to sadness.  Decreased concentration.  Difficulty sleeping.  Crying spells, tearfulness.  Irritability.  Anxiety.  Symptoms of postpartum depression typically begin within the first month after giving birth. These symptoms include:  Difficulty sleeping or excessive sleepiness.  Marked weight loss.  Agitation.  Feelings of  worthlessness.  Lack of interest in activity or food.  Postpartum psychosis is a very serious condition and can be dangerous. Fortunately, it is rare. Displaying any of the following symptoms is cause for immediate medical attention. Symptoms of postpartum psychosis include:  Hallucinations and delusions.  Bizarre or disorganized behavior.  Confusion or disorientation.  How is this diagnosed? A diagnosis is made by an evaluation of your symptoms. There are no medical or lab tests that lead to a diagnosis, but there are various questionnaires that a health care provider may use to identify those  with the baby blues, postpartum depression, or psychosis. Often, a screening tool called the [place] Postnatal Depression Scale is used to diagnose depression in the postpartum period. How is this treated? The baby blues usually goes away on its own in 1-2 weeks. Social support is often all that is needed. You will be encouraged to get adequate sleep and rest. Occasionally, you may be given medicines to help you sleep. Postpartum depression requires treatment because it can last several months or longer if it is not treated. Treatment may include individual or group therapy, medicine, or both to address any social, physiological, and psychological factors that may play a role in the depression. Regular exercise, a healthy diet, rest, and social support may also be strongly recommended. Postpartum psychosis is more serious and needs treatment right away. Hospitalization is often needed. Follow these instructions at home:  Get as much rest as you can. Nap when the baby sleeps.  Exercise regularly. Some women find yoga and walking to be beneficial.  Eat a balanced and nourishing diet.  Do little things that you enjoy. Have a cup of tea, take a bubble bath, read your favorite magazine, or listen to your favorite music.  Avoid alcohol.  Ask for help with household chores, cooking, grocery shopping,  or running errands as needed. Do not try to do everything.  Talk to people close to you about how you are feeling. Get support from your partner, family members, friends, or other new moms.  Try to stay positive in how you think. Think about the things you are grateful for.  Do not spend a lot of time alone.  Only take over-the-counter or prescription medicine as directed by your health care provider.  Keep all your postpartum appointments.  Let your health care provider know if you have any concerns. Contact a health care provider if: You are having a reaction to or problems with your medicine. Get help right away if:  You have suicidal feelings.  You think you may harm the baby or someone else. This information is not intended to replace advice given to you by your health care provider. Make sure you discuss any questions you have with your health care provider. Document Released: 12/06/2003 Document Revised: 08/09/2015 Document Reviewed: 12/13/2012 Elsevier Interactive Patient Education  2017 [employer].

## 2017-08-07 NOTE — Discharge Summary (Signed)
Obstetric Discharge Summary   Patient ID: [name] [name] MRN: [id number] DOB/AGE: 1995-11-25 21 y.o.   Date of Admission: 08/04/2017 [name] [name], CNM [name] Cross, MD)  Date of Discharge: 08/06/2017 [name] [name], CNM [name] Marshall, MD)  Admitting Diagnosis: Premature rupture of membrane at [redacted]w[redacted]d  Secondary Diagnosis: Short interval between pregnancy, Positive GBS test  Mode of Delivery: normal spontaneous vaginal delivery     Discharge Diagnosis: No other diagnosis   Intrapartum Procedures: Atificial rupture of membranes, epidural, GBS prophylaxis and pitocin augmentation   Post partum procedures: None  Complications: none   Brief Hospital Course   [name] [name] is a [id number] who had a SVD on 08/04/2017;  for further details of this birth, please refer to the delivey note.  Patient had an uncomplicated postpartum course.  By time of discharge on PPD#2, her pain was controlled on oral pain medications; she had appropriate lochia and was ambulating, voiding without difficulty and tolerating regular diet.  She was deemed stable for discharge to home.    Labs:  CBC Latest Ref Rng & Units 08/05/2017 08/04/2017 05/15/2017  WBC 3.6 - 11.0 K/uL 13.3(H) 10.5 7.4  Hemoglobin 12.0 - 16.0 g/dL 11.2(L) 11.3(L) 11.2  Hematocrit 35.0 - 47.0 % 33.6(L) 33.7(L) 33.0(L)  Platelets 150 - 440 K/uL 185 200 186   O POS  Physical exam:   Blood pressure 123/80, pulse 70, temperature 98.1 F (36.7 C), temperature source Oral, resp. rate 20, height  (1.6 m), weight 189 lb (85.7 kg), last menstrual period 10/27/2016, SpO2 100 %, unknown if currently breastfeeding.   General: alert and no distress  Lochia: appropriate  Abdomen: soft, NT  Uterine Fundus: firm  Perineum: healing well, no significant drainage, no dehiscence, no significant erythema  Extremities: No evidence of DVT seen on physical exam. No lower extremity edema.  Discharge Instructions: Per After Visit Summary.  Activity:  Advance as tolerated. Pelvic rest for 6 weeks.  Also refer to After Visit Summary  Diet: Regular  Medications:  Allergies as of 08/06/2017      Reactions   Ibuprofen Nausea Only, Other (See Comments)   ibuprofen 800 mg - Vomiting and Shaking   Naproxen Itching, Nausea Only      Medication List    TAKE these medications   medroxyPROGESTERone 150 MG/ML injection Commonly known as:  DEPO-PROVERA Inject 1 mL (150 mg total) into the muscle every 3 (three) months.   Oxycodone HCl 10 MG Tabs Take 1 tablet (10 mg total) by mouth every 4 (four) hours as needed (pain scale > 7).   prenatal multivitamin Tabs tablet Take 1 tablet by mouth daily at 12 noon.   PROAIR HFA 108 (90 Base) MCG/ACT inhaler Generic drug:  albuterol Inhale into the lungs.      Outpatient follow up:  Follow-up Information    [name], CNM. Schedule an appointment as soon as possible for a visit.   Specialties:  Certified Nurse Midwife, Obstetrics and Gynecology, Radiology Why:  Please call to schedule six (6) week PPV with JML Contact information: [address] [place] [address] [phone]          Postpartum contraception: Depo-Provera, Nexplanon  Discharged Condition: stable  Discharged to: home   Newborn Data:   Disposition:home with mother  Apgars: APGAR (1 MIN): 8   APGAR (5 MINS): 9      Baby Feeding: Bottle    [name], CNM [facility]

## 2017-08-14 ENCOUNTER — Encounter: Payer: Self-pay | Admitting: Certified Nurse Midwife

## 2017-08-17 NOTE — Telephone Encounter (Signed)
Please contact [name] [name]. She how she is doing. Advise her she may have Rx for Lidocaine patch or use flexeril as previously prescribed. Thanks JML

## 2017-08-20 NOTE — Telephone Encounter (Signed)
The patient is stating the oxycodone is not relieving her back pain.  She has 16 pills left of her Rx, and stopped taking them due to "they are not helping."  [facility] is using [facility] [place].  Please advise.  Thank you.

## 2017-10-01 ENCOUNTER — Ambulatory Visit (INDEPENDENT_AMBULATORY_CARE_PROVIDER_SITE_OTHER): Payer: BLUE CROSS/BLUE SHIELD | Admitting: Certified Nurse Midwife

## 2017-10-01 MED ORDER — NORETHIN ACE-ETH ESTRAD-FE 1-20 MG-MCG PO TABS: 1.0000 | ORAL_TABLET | Freq: Every day | ORAL | 4 refills | Status: DC

## 2017-10-01 NOTE — Progress Notes (Signed)
Pt is here for a post partum visit. Is bottle feeding, LMP 09/19/17,would bc unsure what form, has not resumed intercourse. Screening 6

## 2017-10-01 NOTE — Patient Instructions (Signed)
Ethinyl Estradiol; Norethindrone Acetate; Ferrous fumarate tablets or capsules What is this medicine? ETHINYL ESTRADIOL; NORETHINDRONE ACETATE; FERROUS FUMARATE (ETH in il es tra DYE ole; nor eth IN drone AS e tate; FER us FUE ma rate) is an oral contraceptive. The products combine two types of female hormones, an estrogen and a progestin. They are used to prevent ovulation and pregnancy. Some products are also used to treat acne in females. This medicine may be used for other purposes; ask your health care provider or pharmacist if you have questions. COMMON BRAND NAME(S): Blisovi 24 Fe, Blisovi Fe, Estrostep Fe, Gildess 24 Fe, Gildess Fe 1.5/30, Gildess Fe 1/20, Junel Fe 1.5/30, Junel Fe 1/20, Junel Fe 24, Larin Fe, Lo Loestrin Fe, Loestrin 24 Fe, Loestrin FE 1.5/30, Loestrin FE 1/20, Lomedia 24 Fe, Microgestin 24 Fe, Microgestin Fe 1.5/30, Microgestin Fe 1/20, Tarina Fe 1/20, Taytulla, Tilia Fe, Tri-Legest Fe What should I tell my health care provider before I take this medicine? They need to know if you have any of these conditions: -abnormal vaginal bleeding -blood vessel disease -breast, cervical, endometrial, ovarian, liver, or uterine cancer -diabetes -gallbladder disease -heart disease or recent heart attack -high blood pressure -high cholesterol -history of blood clots -kidney disease -liver disease -migraine headaches -smoke tobacco -stroke -systemic lupus erythematosus (SLE) -an unusual or allergic reaction to estrogens, progestins, other medicines, foods, dyes, or preservatives -pregnant or trying to get pregnant -breast-feeding How should I use this medicine? Take this medicine by mouth. To reduce nausea, this medicine may be taken with food. Follow the directions on the prescription label. Take this medicine at the same time each day and in the order directed on the package. Do not take your medicine more often than directed. A patient package insert for the product will be  given with each prescription and refill. Read this sheet carefully each time. The sheet may change frequently. Contact your pediatrician regarding the use of this medicine in children. Special care may be needed. This medicine has been used in female children who have started having menstrual periods. Overdosage: If you think you have taken too much of this medicine contact a poison control center or emergency room at once. NOTE: This medicine is only for you. Do not share this medicine with others. What if I miss a dose? If you miss a dose, refer to the patient information sheet you received with your medicine for direction. If you miss more than one pill, this medicine may not be as effective and you may need to use another form of birth control. What may interact with this medicine? Do not take this medicine with the following medication: -dasabuvir; ombitasvir; paritaprevir; ritonavir -ombitasvir; paritaprevir; ritonavir This medicine may also interact with the following medications: -acetaminophen -antibiotics or medicines for infections, especially rifampin, rifabutin, rifapentine, and griseofulvin, and possibly penicillins or tetracyclines -aprepitant -ascorbic acid (vitamin C) -atorvastatin -barbiturate medicines, such as phenobarbital -bosentan -carbamazepine -caffeine -clofibrate -cyclosporine -dantrolene -doxercalciferol -felbamate -grapefruit juice -hydrocortisone -medicines for anxiety or sleeping problems, such as diazepam or temazepam -medicines for diabetes, including pioglitazone -mineral oil -modafinil -mycophenolate -nefazodone -oxcarbazepine -phenytoin -prednisolone -ritonavir or other medicines for HIV infection or AIDS -rosuvastatin -selegiline -soy isoflavones supplements -St. John's wort -tamoxifen or raloxifene -theophylline -thyroid hormones -topiramate -warfarin This list may not describe all possible interactions. Give your health care  provider a list of all the medicines, herbs, non-prescription drugs, or dietary supplements you use. Also tell them if you smoke, drink alcohol, or use illegal drugs. Some   items may interact with your medicine. What should I watch for while using this medicine? Visit your doctor or health care professional for regular checks on your progress. You will need a regular breast and pelvic exam and Pap smear while on this medicine. Use an additional method of contraception during the first cycle that you take these tablets. If you have any reason to think you are pregnant, stop taking this medicine right away and contact your doctor or health care professional. If you are taking this medicine for hormone related problems, it may take several cycles of use to see improvement in your condition. Smoking increases the risk of getting a blood clot or having a stroke while you are taking birth control pills, especially if you are more than 21 years old. You are strongly advised not to smoke. This medicine can make your body retain fluid, making your fingers, hands, or ankles swell. Your blood pressure can go up. Contact your doctor or health care professional if you feel you are retaining fluid. This medicine can make you more sensitive to the sun. Keep out of the sun. If you cannot avoid being in the sun, wear protective clothing and use sunscreen. Do not use sun lamps or tanning beds/booths. If you wear contact lenses and notice visual changes, or if the lenses begin to feel uncomfortable, consult your eye care specialist. In some women, tenderness, swelling, or minor bleeding of the gums may occur. Notify your dentist if this happens. Brushing and flossing your teeth regularly may help limit this. See your dentist regularly and inform your dentist of the medicines you are taking. If you are going to have elective surgery, you may need to stop taking this medicine before the surgery. Consult your health care  professional for advice. This medicine does not protect you against HIV infection (AIDS) or any other sexually transmitted diseases. What side effects may I notice from receiving this medicine? Side effects that you should report to your doctor or health care professional as soon as possible: -allergic reactions like skin rash, itching or hives, swelling of the face, lips, or tongue -breast tissue changes or discharge -changes in vaginal bleeding during your period or between your periods -changes in vision -chest pain -confusion -coughing up blood -dizziness -feeling faint or lightheaded -headaches or migraines -leg, arm or groin pain -loss of balance or coordination -severe or sudden headaches -stomach pain (severe) -sudden shortness of breath -sudden numbness or weakness of the face, arm or leg -symptoms of vaginal infection like itching, irritation or unusual discharge -tenderness in the upper abdomen -trouble speaking or understanding -vomiting -yellowing of the eyes or skin Side effects that usually do not require medical attention (report to your doctor or health care professional if they continue or are bothersome): -breakthrough bleeding and spotting that continues beyond the 3 initial cycles of pills -breast tenderness -mood changes, anxiety, depression, frustration, anger, or emotional outbursts -increased sensitivity to sun or ultraviolet light -nausea -skin rash, acne, or brown spots on the skin -weight gain (slight) This list may not describe all possible side effects. Call your doctor for medical advice about side effects. You may report side effects to FDA at 1-800-FDA-1088. Where should I keep my medicine? Keep out of the reach of children. Store at room temperature between 15 and 30 degrees C (59 and 86 degrees F). Throw away any unused medicine after the expiration date. NOTE: This sheet is a summary. It may not cover all possible information. If you   have  questions about this medicine, talk to your doctor, pharmacist, or health care provider.  2018 Elsevier/Gold Standard (2015-11-12 08:04:41) Preventive Care 18-39 Years, Female Preventive care refers to lifestyle choices and visits with your health care provider that can promote health and wellness. What does preventive care include?  A yearly physical exam. This is also called an annual well check.  Dental exams once or twice a year.  Routine eye exams. Ask your health care provider how often you should have your eyes checked.  Personal lifestyle choices, including: ? Daily care of your teeth and gums. ? Regular physical activity. ? Eating a healthy diet. ? Avoiding tobacco and drug use. ? Limiting alcohol use. ? Practicing safe sex. ? Taking vitamin and mineral supplements as recommended by your health care provider. What happens during an annual well check? The services and screenings done by your health care provider during your annual well check will depend on your age, overall health, lifestyle risk factors, and family history of disease. Counseling Your health care provider may ask you questions about your:  Alcohol use.  Tobacco use.  Drug use.  Emotional well-being.  Home and relationship well-being.  Sexual activity.  Eating habits.  Work and work environment.  Method of birth control.  Menstrual cycle.  Pregnancy history.  Screening You may have the following tests or measurements:  Height, weight, and BMI.  Diabetes screening. This is done by checking your blood sugar (glucose) after you have not eaten for a while (fasting).  Blood pressure.  Lipid and cholesterol levels. These may be checked every 5 years starting at age 20.  Skin check.  Hepatitis C blood test.  Hepatitis B blood test.  Sexually transmitted disease (STD) testing.  BRCA-related cancer screening. This may be done if you have a family history of breast, ovarian, tubal, or  peritoneal cancers.  Pelvic exam and Pap test. This may be done every 3 years starting at age 21. Starting at age 30, this may be done every 5 years if you have a Pap test in combination with an HPV test.  Discuss your test results, treatment options, and if necessary, the need for more tests with your health care provider. Vaccines Your health care provider may recommend certain vaccines, such as:  Influenza vaccine. This is recommended every year.  Tetanus, diphtheria, and acellular pertussis (Tdap, Td) vaccine. You may need a Td booster every 10 years.  Varicella vaccine. You may need this if you have not been vaccinated.  HPV vaccine. If you are 26 or younger, you may need three doses over 6 months.  Measles, mumps, and rubella (MMR) vaccine. You may need at least one dose of MMR. You may also need a second dose.  Pneumococcal 13-valent conjugate (PCV13) vaccine. You may need this if you have certain conditions and were not previously vaccinated.  Pneumococcal polysaccharide (PPSV23) vaccine. You may need one or two doses if you smoke cigarettes or if you have certain conditions.  Meningococcal vaccine. One dose is recommended if you are age 19-21 years and a first-year college student living in a residence hall, or if you have one of several medical conditions. You may also need additional booster doses.  Hepatitis A vaccine. You may need this if you have certain conditions or if you travel or work in places where you may be exposed to hepatitis A.  Hepatitis B vaccine. You may need this if you have certain conditions or if you travel or work in   places where you may be exposed to hepatitis B.  Haemophilus influenzae type b (Hib) vaccine. You may need this if you have certain risk factors.  Talk to your health care provider about which screenings and vaccines you need and how often you need them. This information is not intended to replace advice given to you by your health care  provider. Make sure you discuss any questions you have with your health care provider. Document Released: 04/29/2001 Document Revised: 11/21/2015 Document Reviewed: 01/02/2015 Elsevier Interactive Patient Education  2018 Elsevier Inc.  

## 2017-10-02 ENCOUNTER — Telehealth: Payer: Self-pay | Admitting: Certified Nurse Midwife

## 2017-10-02 NOTE — Telephone Encounter (Signed)
Patient called and cancelled her appointment for nexplanon insertion. She wanted to know if she could go back to the birth control patches. She uses the [facility] in [place].Thanks

## 2017-10-03 NOTE — Progress Notes (Signed)
Subjective:    [name] [name] is a 22 y.o. [id number] African American female who presents for a postpartum visit. She is 6 weeks postpartum following a spontaneous vaginal delivery at 40 gestational weeks. Anesthesia: epidural. I have fully reviewed the prenatal and intrapartum course.   Postpartum course has been uncomplicated. Baby's course has been uncomplicated. Baby is feeding by formula. Bleeding no bleeding. Bowel function is normal. Bladder function is normal.   Patient is not sexually active. Contraception method is abstinence. 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 Score 6.  Last pap due.  Denies difficulty breathing or respiratory distress, chest pain, abdominal pain, dysuria, and leg pain or swelling.   The following portions of the patient's history were reviewed and updated as appropriate: allergies, current medications, past medical history, past surgical history and problem list.  Review of Systems  Pertinent items are noted in HPI.   Objective:   BP 110/79   Pulse 65   Ht 5\' 3"  (1.6 m)   Wt 175 lb 2 oz (79.4 kg)   LMP 09/19/2017 (Exact Date)   Breastfeeding? No   BMI 31.02 kg/m   General:  alert, cooperative and no distress   Breasts:  deferred, no complaints  Lungs: clear to auscultation bilaterally  Heart:  regular rate and rhythm  Abdomen: soft, nontender   Pelvic exam:  declined by patient         [facility]  PHQ-9 Total Score  6      Assessment:   Postpartum exam Six (6) wks s/p spontaneous vaginal birth Formula feeding Depression screening Contraception counseling   Plan:   Rx: Junel, see orders.   Reviewed red flag symptoms and when to call.   Follow up in: 3 months for Annual exam with Pap smear or earlier if needed   Gunnar BullaJenkins Michelle Jerris Keltz, CNM [facility]

## 2017-10-06 ENCOUNTER — Other Ambulatory Visit: Payer: Self-pay

## 2017-10-06 MED ORDER — NORELGESTROMIN-ETH ESTRADIOL 150-35 MCG/24HR TD PTWK: 1.0000 | MEDICATED_PATCH | TRANSDERMAL | 12 refills | Status: DC

## 2017-10-06 NOTE — Telephone Encounter (Signed)
Please contact patient. What is all this jazz about a Nexplanon? She said she wanted to start pills at her PPV, so I sent a prescription for Junel to her pharmacy on file. However, if she would like the patch instead, then she may have Rx Xulane. Thanks, JML

## 2017-10-12 NOTE — Telephone Encounter (Signed)
Please follow up to make sure she scheduled an appointment. Had PPD last pregnancy as well. Thanks, JML

## 2017-10-13 ENCOUNTER — Ambulatory Visit: Payer: Self-pay | Admitting: Certified Nurse Midwife

## 2017-10-15 ENCOUNTER — Telehealth: Payer: Self-pay

## 2017-10-15 ENCOUNTER — Encounter: Payer: Self-pay | Admitting: Certified Nurse Midwife

## 2017-10-15 NOTE — Telephone Encounter (Signed)
Spoke with pt to see why she missed her appointment for depression. She states she overslept and is getting ready for work right now. She states she will call next week to reschedule when she knows her days off. Encouraged to reach out to us if she has any more concerns or questions. Pt was agreeable.

## 2017-10-30 ENCOUNTER — Telehealth: Payer: Self-pay | Admitting: Certified Nurse Midwife

## 2017-10-30 NOTE — Telephone Encounter (Signed)
The patient called and stated that she needs a form filled out for an extension for leave for work. The patient also asked if it would be possible for the form to be completed today. Please advise.

## 2018-01-05 ENCOUNTER — Encounter: Payer: Self-pay | Admitting: Certified Nurse Midwife

## 2018-01-05 ENCOUNTER — Other Ambulatory Visit: Payer: Self-pay

## 2018-01-05 ENCOUNTER — Other Ambulatory Visit (HOSPITAL_COMMUNITY)
Admission: RE | Admit: 2018-01-05 | Discharge: 2018-01-05 | Disposition: A | Discharge status: Home / Self Care | Payer: Medicaid Other | Source: Ambulatory Visit | Attending: Certified Nurse Midwife | Admitting: Certified Nurse Midwife

## 2018-01-05 ENCOUNTER — Ambulatory Visit (INDEPENDENT_AMBULATORY_CARE_PROVIDER_SITE_OTHER): Payer: Medicaid Other | Admitting: Certified Nurse Midwife

## 2018-01-05 VITALS — BP 126/68 | HR 65 | Ht 63.0 in | Wt 171.7 lb

## 2018-01-05 DIAGNOSIS — Z01419 Encounter for gynecological examination (general) (routine) without abnormal findings: Secondary | ICD-10-CM | POA: Insufficient documentation

## 2018-01-05 DIAGNOSIS — Z113 Encounter for screening for infections with a predominantly sexual mode of transmission: Secondary | ICD-10-CM | POA: Diagnosis not present

## 2018-01-05 DIAGNOSIS — Z6830 Body mass index (BMI) 30.0-30.9, adult: Secondary | ICD-10-CM

## 2018-01-05 DIAGNOSIS — Z0000 Encounter for general adult medical examination without abnormal findings: Secondary | ICD-10-CM

## 2018-01-05 DIAGNOSIS — Z124 Encounter for screening for malignant neoplasm of cervix: Secondary | ICD-10-CM | POA: Diagnosis not present

## 2018-01-05 DIAGNOSIS — Z1331 Encounter for screening for depression: Secondary | ICD-10-CM

## 2018-01-05 MED ORDER — PAROXETINE HCL 10 MG PO TABS: 10.0000 mg | ORAL_TABLET | Freq: Every day | ORAL | 1 refills | Status: DC

## 2018-01-05 MED ORDER — NORELGESTROMIN-ETH ESTRADIOL 150-35 MCG/24HR TD PTWK: 1.0000 | MEDICATED_PATCH | TRANSDERMAL | 12 refills | Status: DC

## 2018-01-05 NOTE — Patient Instructions (Addendum)
Preventive Care 18-39 Years, Female Preventive care refers to lifestyle choices and visits with your health care provider that can promote health and wellness. What does preventive care include?  A yearly physical exam. This is also called an annual well check.  Dental exams once or twice a year.  Routine eye exams. Ask your health care provider how often you should have your eyes checked.  Personal lifestyle choices, including: ? Daily care of your teeth and gums. ? Regular physical activity. ? Eating a healthy diet. ? Avoiding tobacco and drug use. ? Limiting alcohol use. ? Practicing safe sex. ? Taking vitamin and mineral supplements as recommended by your health care provider. What happens during an annual well check? The services and screenings done by your health care provider during your annual well check will depend on your age, overall health, lifestyle risk factors, and family history of disease. Counseling Your health care provider may ask you questions about your:  Alcohol use.  Tobacco use.  Drug use.  Emotional well-being.  Home and relationship well-being.  Sexual activity.  Eating habits.  Work and work Statistician.  Method of birth control.  Menstrual cycle.  Pregnancy history.  Screening You may have the following tests or measurements:  Height, weight, and BMI.  Diabetes screening. This is done by checking your blood sugar (glucose) after you have not eaten for a while (fasting).  Blood pressure.  Lipid and cholesterol levels. These may be checked every 5 years starting at age 66.  Skin check.  Hepatitis C blood test.  Hepatitis B blood test.  Sexually transmitted disease (STD) testing.  BRCA-related cancer screening. This may be done if you have a family history of breast, ovarian, tubal, or peritoneal cancers.  Pelvic exam and Pap test. This may be done every 3 years starting at age 40. Starting at age 59, this may be done every 5  years if you have a Pap test in combination with an HPV test.  Discuss your test results, treatment options, and if necessary, the need for more tests with your health care provider. Vaccines Your health care provider may recommend certain vaccines, such as:  Influenza vaccine. This is recommended every year.  Tetanus, diphtheria, and acellular pertussis (Tdap, Td) vaccine. You may need a Td booster every 10 years.  Varicella vaccine. You may need this if you have not been vaccinated.  HPV vaccine. If you are 69 or younger, you may need three doses over 6 months.  Measles, mumps, and rubella (MMR) vaccine. You may need at least one dose of MMR. You may also need a second dose.  Pneumococcal 13-valent conjugate (PCV13) vaccine. You may need this if you have certain conditions and were not previously vaccinated.  Pneumococcal polysaccharide (PPSV23) vaccine. You may need one or two doses if you smoke cigarettes or if you have certain conditions.  Meningococcal vaccine. One dose is recommended if you are age 27-21 years and a first-year college student living in a residence hall, or if you have one of several medical conditions. You may also need additional booster doses.  Hepatitis A vaccine. You may need this if you have certain conditions or if you travel or work in places where you may be exposed to hepatitis A.  Hepatitis B vaccine. You may need this if you have certain conditions or if you travel or work in places where you may be exposed to hepatitis B.  Haemophilus influenzae type b (Hib) vaccine. You may need this if  you have certain risk factors.  Talk to your health care provider about which screenings and vaccines you need and how often you need them. This information is not intended to replace advice given to you by your health care provider. Make sure you discuss any questions you have with your health care provider. Document Released: 04/29/2001 Document Revised: 11/21/2015  Document Reviewed: 01/02/2015 Elsevier Interactive Patient Education  2018 [employer].  Ethinyl Estradiol; Norelgestromin skin patches What is this medicine? ETHINYL ESTRADIOL;NORELGESTROMIN (ETH in il es tra DYE ole; nor el JES troe min) skin patch is used as a contraceptive (birth control method). This medicine combines two types of female hormones, an estrogen and a progestin. This patch is used to prevent ovulation and pregnancy. This medicine may be used for other purposes; ask your health care provider or pharmacist if you have questions. COMMON BRAND NAME(S): Ortho Becky Sax What should I tell my health care provider before I take this medicine? They need to know if you have or ever had any of these conditions: -abnormal vaginal bleeding -blood vessel disease or blood clots -breast, cervical, endometrial, ovarian, liver, or uterine cancer -diabetes -gallbladder disease -heart disease or recent heart attack -high blood pressure -high cholesterol -kidney disease -liver disease -migraine headaches -stroke -systemic lupus erythematosus (SLE) -tobacco smoker -an unusual or allergic reaction to estrogens, progestins, other medicines, foods, dyes, or preservatives -pregnant or trying to get pregnant -breast-feeding How should I use this medicine? This patch is applied to the skin. Follow the directions on the prescription label. Apply to clean, dry, healthy skin on the buttock, abdomen, upper outer arm or upper torso, in a place where it will not be rubbed by tight clothing. Do not use lotions or other cosmetics on the site where the patch will go. Press the patch firmly in place for 10 seconds to ensure good contact with the skin. Change the patch every 7 days on the same day of the week for 3 weeks. You will then have a break from the patch for 1 week, after which you will apply a new patch. Do not use your medicine more often than directed. Contact your pediatrician regarding  the use of this medicine in children. Special care may be needed. This medicine has been used in female children who have started having menstrual periods. A patient package insert for the product will be given with each prescription and refill. Read this sheet carefully each time. The sheet may change frequently. Overdosage: If you think you have taken too much of this medicine contact a poison control center or emergency room at once. NOTE: This medicine is only for you. Do not share this medicine with others. What if I miss a dose? You will need to replace your patch once a week as directed. If your patch is lost or falls off, contact your health care professional for advice. You may need to use another form of birth control if your patch has been off for more than 1 day. What may interact with this medicine? Do not take this medicine with the following medication: -dasabuvir; ombitasvir; paritaprevir; ritonavir -ombitasvir; paritaprevir; ritonavir This medicine may also interact with the following medications: -acetaminophen -antibiotics or medicines for infections, especially rifampin, rifabutin, rifapentine, and griseofulvin, and possibly penicillins or tetracyclines -aprepitant -ascorbic acid (vitamin C) -atorvastatin -barbiturate medicines, such as phenobarbital -bosentan -carbamazepine -caffeine -clofibrate -cyclosporine -dantrolene -doxercalciferol -felbamate -grapefruit juice -hydrocortisone -medicines for anxiety or sleeping problems, such as diazepam or temazepam -medicines for diabetes,  including pioglitazone -modafinil -mycophenolate -nefazodone -oxcarbazepine -phenytoin -prednisolone -ritonavir or other medicines for HIV infection or AIDS -rosuvastatin -selegiline -soy isoflavones supplements -St. John's wort -tamoxifen or raloxifene -theophylline -thyroid hormones -topiramate -warfarin This list may not describe all possible interactions. Give your health  care provider a list of all the medicines, herbs, non-prescription drugs, or dietary supplements you use. Also tell them if you smoke, drink alcohol, or use illegal drugs. Some items may interact with your medicine. What should I watch for while using this medicine? Visit your doctor or health care professional for regular checks on your progress. You will need a regular breast and pelvic exam and Pap smear while on this medicine. Use an additional method of contraception during the first cycle that you use this patch. If you have any reason to think you are pregnant, stop using this medicine right away and contact your doctor or health care professional. If you are using this medicine for hormone related problems, it may take several cycles of use to see improvement in your condition. Smoking increases the risk of getting a blood clot or having a stroke while you are using hormonal birth control, especially if you are more than 21 years old. You are strongly advised not to smoke. This medicine can make your body retain fluid, making your fingers, hands, or ankles swell. Your blood pressure can go up. Contact your doctor or health care professional if you feel you are retaining fluid. This medicine can make you more sensitive to the sun. Keep out of the sun. If you cannot avoid being in the sun, wear protective clothing and use sunscreen. Do not use sun lamps or tanning beds/booths. If you wear contact lenses and notice visual changes, or if the lenses begin to feel uncomfortable, consult your eye care specialist. In some women, tenderness, swelling, or minor bleeding of the gums may occur. Notify your dentist if this happens. Brushing and flossing your teeth regularly may help limit this. See your dentist regularly and inform your dentist of the medicines you are taking. If you are going to have elective surgery or a MRI, you may need to stop using this medicine before the surgery or MRI. Consult your  health care professional for advice. This medicine does not protect you against HIV infection (AIDS) or any other sexually transmitted diseases. What side effects may I notice from receiving this medicine? Side effects that you should report to your doctor or health care professional as soon as possible: -breast tissue changes or discharge -changes in vaginal bleeding during your period or between your periods -chest pain -coughing up blood -dizziness or fainting spells -headaches or migraines -leg, arm or groin pain -severe or sudden headaches -stomach pain (severe) -sudden shortness of breath -sudden loss of coordination, especially on one side of the body -speech problems -symptoms of vaginal infection like itching, irritation or unusual discharge -tenderness in the upper abdomen -vomiting -weakness or numbness in the arms or legs, especially on one side of the body -yellowing of the eyes or skin Side effects that usually do not require medical attention (report to your doctor or health care professional if they continue or are bothersome): -breakthrough bleeding and spotting that continues beyond the 3 initial cycles of pills -breast tenderness -mood changes, anxiety, depression, frustration, anger, or emotional outbursts -increased sensitivity to sun or ultraviolet light -nausea -skin rash, acne, or brown spots on the skin -weight gain (slight) This list may not describe all possible side effects. Call your  doctor for medical advice about side effects. You may report side effects to FDA at 1-800-FDA-1088. Where should I keep my medicine? Keep out of the reach of children. Store at room temperature between 15 and 30 degrees C (59 and 86 degrees F). Keep the patch in its pouch until time of use. Throw away any unused medicine after the expiration date. Dispose of used patches properly. Since a used patch may still contain active hormones, fold the patch in half so that it sticks  to itself prior to disposal. Throw away in a place where children or pets cannot reach. NOTE: This sheet is a summary. It may not cover all possible information. If you have questions about this medicine, talk to your doctor, pharmacist, or health care provider.  2018 Elsevier/Gold Standard (2015-11-12 07:59:03) Paroxetine tablets What is this medicine? PAROXETINE (pa ROX e teen) is used to treat depression. It may also be used to treat anxiety disorders, obsessive compulsive disorder, panic attacks, post traumatic stress, and premenstrual dysphoric disorder (PMDD). This medicine may be used for other purposes; ask your health care provider or pharmacist if you have questions. COMMON BRAND NAME(S): Paxil, Pexeva What should I tell my health care provider before I take this medicine? They need to know if you have any of these conditions: -bipolar disorder or a family history of bipolar disorder -bleeding disorders -glaucoma -heart disease -kidney disease -liver disease -low levels of sodium in the blood -seizures -suicidal thoughts, plans, or attempt; a previous suicide attempt by you or a family member -take MAOIs like Carbex, Eldepryl, Marplan, Nardil, and Parnate -take medicines that treat or prevent blood clots -thyroid disease -an unusual or allergic reaction to paroxetine, other medicines, foods, dyes, or preservatives -pregnant or trying to get pregnant -breast-feeding How should I use this medicine? Take this medicine by mouth with a glass of water. Follow the directions on the prescription label. You can take it with or without food. Take your medicine at regular intervals. Do not take your medicine more often than directed. Do not stop taking this medicine suddenly except upon the advice of your doctor. Stopping this medicine too quickly may cause serious side effects or your condition may worsen. A special MedGuide will be given to you by the pharmacist with each prescription  and refill. Be sure to read this information carefully each time. Talk to your pediatrician regarding the use of this medicine in children. Special care may be needed. Overdosage: If you think you have taken too much of this medicine contact a poison control center or emergency room at once. NOTE: This medicine is only for you. Do not share this medicine with others. What if I miss a dose? If you miss a dose, take it as soon as you can. If it is almost time for your next dose, take only that dose. Do not take double or extra doses. What may interact with this medicine? Do not take this medicine with any of the following medications: -linezolid -MAOIs like Carbex, Eldepryl, Marplan, Nardil, and Parnate -methylene blue (injected into a vein) -pimozide -thioridazine This medicine may also interact with the following medications: -alcohol -amphetamines -aspirin and aspirin-like medicines -atomoxetine -certain medicines for depression, anxiety, or psychotic disturbances -certain medicines for irregular heart beat like propafenone, flecainide, encainide, and quinidine -certain medicines for migraine headache like almotriptan, eletriptan, frovatriptan, naratriptan, rizatriptan, sumatriptan, zolmitriptan -cimetidine -digoxin -diuretics -fentanyl -fosamprenavir -furazolidone -isoniazid -lithium -medicines that treat or prevent blood clots like warfarin, enoxaparin, and dalteparin -medicines  for sleep -NSAIDs, medicines for pain and inflammation, like ibuprofen or naproxen -phenobarbital -phenytoin -procarbazine -rasagiline -ritonavir -supplements like St. John's wort, kava kava, valerian -tamoxifen -tramadol -tryptophan This list may not describe all possible interactions. Give your health care provider a list of all the medicines, herbs, non-prescription drugs, or dietary supplements you use. Also tell them if you smoke, drink alcohol, or use illegal drugs. Some items may interact  with your medicine. What should I watch for while using this medicine? Tell your doctor if your symptoms do not get better or if they get worse. Visit your doctor or health care professional for regular checks on your progress. Because it may take several weeks to see the full effects of this medicine, it is important to continue your treatment as prescribed by your doctor. Patients and their families should watch out for new or worsening thoughts of suicide or depression. Also watch out for sudden changes in feelings such as feeling anxious, agitated, panicky, irritable, hostile, aggressive, impulsive, severely restless, overly excited and hyperactive, or not being able to sleep. If this happens, especially at the beginning of treatment or after a change in dose, call your health care professional. Dennis Bast may get drowsy or dizzy. Do not drive, use machinery, or do anything that needs mental alertness until you know how this medicine affects you. Do not stand or sit up quickly, especially if you are an older patient. This reduces the risk of dizzy or fainting spells. Alcohol may interfere with the effect of this medicine. Avoid alcoholic drinks. Your mouth may get dry. Chewing sugarless gum or sucking hard candy, and drinking plenty of water will help. Contact your doctor if the problem does not go away or is severe. What side effects may I notice from receiving this medicine? Side effects that you should report to your doctor or health care professional as soon as possible: -allergic reactions like skin rash, itching or hives, swelling of the face, lips, or tongue -anxious -black, tarry stools -changes in vision -confusion -elevated mood, decreased need for sleep, racing thoughts, impulsive behavior -eye pain -fast, irregular heartbeat -feeling faint or lightheaded, falls -feeling agitated, angry, or irritable -hallucination, loss of contact with reality -loss of balance or coordination -loss of  memory -painful or prolonged erections -restlessness, pacing, inability to keep still -seizures -stiff muscles -suicidal thoughts or other mood changes -trouble sleeping -unusual bleeding or bruising -unusually weak or tired -vomiting Side effects that usually do not require medical attention (report to your doctor or health care professional if they continue or are bothersome): -change in appetite or weight -change in sex drive or performance -diarrhea -dizziness -dry mouth -increased sweating -indigestion, nausea -tired -tremors This list may not describe all possible side effects. Call your doctor for medical advice about side effects. You may report side effects to FDA at 1-800-FDA-1088. Where should I keep my medicine? Keep out of the reach of children. Store at room temperature between 15 and 30 degrees C (59 and 86 degrees F). Keep container tightly closed. Throw away any unused medicine after the expiration date. NOTE: This sheet is a summary. It may not cover all possible information. If you have questions about this medicine, talk to your doctor, pharmacist, or health care provider.  2018 Elsevier/Gold Standard (2015-08-04 15:50:32)

## 2018-01-05 NOTE — Progress Notes (Signed)
ANNUAL PREVENTATIVE CARE GYN  ENCOUNTER NOTE  Subjective:       [name] [name] is a 22 y.o. G3P2002 female here for a routine annual gynecologic exam.  Current complaints: 1. Needs Pap smear 2. Abnormal uterine bleeding-period started light and has gone from heavy to light alternating days 3. Questions postpartum depression or anxiety-lacks motivation; feels worst than after previous pregnancy  Denies difficulty breathing or respiratory distress, chest pain, abdominal pain, excessive vaginal bleeding, dysuria, and leg pain or swelling.    Gynecologic History  Patient's last menstrual period was 01/02/2018 (exact date).  Period Cycle (Days): 28 Period Duration (Days): 7 Period Pattern: Regular Menstrual Flow: Moderate Menstrual Control: Tampon, Thin pad Dysmenorrhea: (!) Severe Dysmenorrhea Symptoms: Cramping  Contraception: coitus interruptus  Last Pap: due.   Obstetric History  OB History  Gravida Para Term Preterm AB Living  2 2 2     2   SAB TAB Ectopic Multiple Live Births        0 2    # Outcome Date GA Lbr Len/2nd Weight Sex Delivery Anes PTL Lv  2 Term 08/04/17 [redacted]w[redacted]d / 00:08 7 lb 15.3 oz (3.61 kg) F Vag-Spont EPI  LIV  1 Term 06/06/16 [redacted]w[redacted]d   M Vag-Spont EPI  LIV    Past Medical History:  Diagnosis Date  . Abdominal pain   . Anal fissure   . Asthma   . Constipation   . H/O chlamydia infection 10/30/2015  . Headache     Past Surgical History:  Procedure Laterality Date  . KNEE SURGERY Left 2014, 2015    Current Outpatient Medications on File Prior to Visit  Medication Sig Dispense Refill  . albuterol (PROAIR HFA) 108 (90 Base) MCG/ACT inhaler Inhale into the lungs.     No current facility-administered medications on file prior to visit.     Allergies  Allergen Reactions  . Ibuprofen Nausea Only and Other (See Comments)    ibuprofen 800 mg - Vomiting and Shaking  . Naproxen Itching and Nausea Only    Social History   Socioeconomic History  .  Marital status: Single    Spouse name: Not on file  . Number of children: Not on file  . Years of education: Not on file  . Highest education level: Not on file  Occupational History  . Not on file  Social Needs  . Financial resource strain: Not on file  . Food insecurity:    Worry: Not on file    Inability: Not on file  . Transportation needs:    Medical: Not on file    Non-medical: Not on file  Tobacco Use  . Smoking status: Never Smoker  . Smokeless tobacco: Never Used  Substance and Sexual Activity  . Alcohol use: No  . Drug use: No  . Sexual activity: Yes    Partners: Male    Birth control/protection: Patch  Lifestyle  . Physical activity:    Days per week: Not on file    Minutes per session: Not on file  . Stress: Not on file  Relationships  . Social connections:    Talks on phone: Not on file    Gets together: Not on file    Attends religious service: Not on file    Active member of club or organization: Not on file    Attends meetings of clubs or organizations: Not on file    Relationship status: Not on file  . Intimate partner violence:    Fear of  current or ex partner: Not on file    Emotionally abused: Not on file    Physically abused: Not on file    Forced sexual activity: Not on file  Other Topics Concern  . Not on file  Social History Narrative  . Not on file    Family History  Problem Relation Age of Onset  . Cancer Sister   . Seizures Sister   . Migraines Mother   . Diabetes Paternal Grandmother   . Celiac disease Neg Hx   . Cholelithiasis Neg Hx   . Ulcers Neg Hx   . Breast cancer Neg Hx   . Ovarian cancer Neg Hx   . Colon cancer Neg Hx     The following portions of the patient's history were reviewed and updated as appropriate: allergies, current medications, past family history, past medical history, past social history, past surgical history and problem list.  Review of Systems  ROS negative except as noted above. Information  obtained from patient.    Objective:   BP 126/68   Pulse 65   Ht 5\' 3"  (1.6 m)   Wt 171 lb 11.2 oz (77.9 kg)   LMP 01/02/2018 (Exact Date)   Breastfeeding? No   BMI 30.42 kg/m   CONSTITUTIONAL: Well-developed, well-nourished female in no acute distress.   PSYCHIATRIC: Normal mood and affect. Normal behavior. Normal judgment and thought content.  NEUROLGIC: Alert and oriented to person, place, and time. Normal muscle tone coordination. No cranial nerve deficit noted.  HENT:  Normocephalic, atraumatic, External right and left ear normal. Oropharynx is clear and moist  EYES: Conjunctivae and EOM are normal. Pupils are equal and round.   NECK: Normal range of motion, supple, no masses.  Normal thyroid.   SKIN: Skin is warm and dry. No rash noted. Not diaphoretic. No erythema. No pallor.  CARDIOVASCULAR: Normal heart rate noted, regular rhythm, no murmur.  RESPIRATORY: Clear to auscultation bilaterally. Effort and breath sounds normal, no problems with respiration noted.  BREASTS: Symmetric in size. No masses, skin changes, nipple drainage, or lymphadenopathy.  ABDOMEN: Soft, normal bowel sounds, no distention noted.  No tenderness, rebound or guarding. Obese.   PELVIC:  External Genitalia: Normal  Vagina: Normal  Cervix: Normal, Pap collected  Uterus: Normal  Adnexa: Normal   MUSCULOSKELETAL: Normal range of motion. No tenderness.  No cyanosis, clubbing, or edema.  2+ distal pulses.  LYMPHATIC: No Axillary, Supraclavicular, or Inguinal Adenopathy.  Depression screen PHQ 2/9 01/05/2018  Decreased Interest 3  Down, Depressed, Hopeless 1  PHQ - 2 Score 4  Altered sleeping 0  Tired, decreased energy 3  Change in appetite 3  Feeling bad or failure about yourself  1  Trouble concentrating 1  Moving slowly or fidgety/restless 0  Suicidal thoughts 0  PHQ-9 Score 12  Difficult doing work/chores Somewhat difficult   Assessment:   Annual gynecologic examination 22 y.o.    Contraception: Ortho-Evra patches weekly   Obesity 1   Problem List Items Addressed This Visit    None    Visit Diagnoses    Well woman exam    -  Primary   Relevant Orders   CBC   TSH   Comprehensive metabolic panel   Cytology - PAP   Hemoglobin A1c   Cervical cancer screening       Relevant Orders   Cytology - PAP   BMI 30.0-30.9,adult       Relevant Orders   TSH   Comprehensive metabolic panel  Hemoglobin A1c   Routine screening for STI (sexually transmitted infection)       Relevant Orders   Cytology - PAP   Depression screen          Plan:   Pap: Pap, Reflex if ASCUS  Labs: See orders  Routine preventative health maintenance measures emphasized: Exercise/Diet/Weight control, Tobacco Warnings, Alcohol/Substance use risks and Stress Management; See AVS  Rx: Paxil, see orders  RTC x 1 year for ANNUAL EXAM or sooner if needed   [name], CNM [facility], [facility] 01/05/18 1:16 PM

## 2018-01-05 NOTE — Progress Notes (Signed)
AE, No complaints.

## 2018-01-06 LAB — COMPREHENSIVE METABOLIC PANEL
ALK PHOS: 71 IU/L (ref 39–117)
ALT: 8 IU/L (ref 0–32)
AST: 8 IU/L (ref 0–40)
Albumin/Globulin Ratio: 1.9 (ref 1.2–2.2)
Albumin: 4.4 g/dL (ref 3.5–5.5)
BILIRUBIN TOTAL: 0.3 mg/dL (ref 0.0–1.2)
BUN/Creatinine Ratio: 14 (ref 9–23)
BUN: 11 mg/dL (ref 6–20)
CHLORIDE: 106 mmol/L (ref 96–106)
CO2: 21 mmol/L (ref 20–29)
CREATININE: 0.78 mg/dL (ref 0.57–1.00)
Calcium: 9.5 mg/dL (ref 8.7–10.2)
GFR calc Af Amer: 126 mL/min/{1.73_m2} (ref >59)
GFR calc non Af Amer: 109 mL/min/{1.73_m2} (ref >59)
GLUCOSE: 92 mg/dL (ref 65–99)
Globulin, Total: 2.3 g/dL (ref 1.5–4.5)
Potassium: 4.7 mmol/L (ref 3.5–5.2)
Sodium: 139 mmol/L (ref 134–144)
Total Protein: 6.7 g/dL (ref 6.0–8.5)

## 2018-01-06 LAB — CBC
HEMATOCRIT: 40.9 % (ref 34.0–46.6)
Hemoglobin: 13.1 g/dL (ref 11.1–15.9)
MCH: 25.3 pg — ABNORMAL LOW (ref 26.6–33.0)
MCHC: 32 g/dL (ref 31.5–35.7)
MCV: 79 fL (ref 79–97)
PLATELETS: 294 10*3/uL (ref 150–450)
RBC: 5.18 x10E6/uL (ref 3.77–5.28)
RDW: 14.1 % (ref 12.3–15.4)
WBC: 7 10*3/uL (ref 3.4–10.8)

## 2018-01-06 LAB — HEMOGLOBIN A1C
Est. average glucose Bld gHb Est-mCnc: 103 mg/dL
HEMOGLOBIN A1C: 5.2 % (ref 4.8–5.6)

## 2018-01-06 LAB — TSH: TSH: 0.85 u[IU]/mL (ref 0.450–4.500)

## 2018-01-07 LAB — CYTOLOGY - PAP
BACTERIAL VAGINITIS: POSITIVE — AB
Candida vaginitis: NEGATIVE
Chlamydia: NEGATIVE
Diagnosis: NEGATIVE
Neisseria Gonorrhea: NEGATIVE
TRICH (WINDOWPATH): NEGATIVE

## 2018-01-10 ENCOUNTER — Encounter: Payer: Self-pay | Admitting: Certified Nurse Midwife

## 2018-01-11 ENCOUNTER — Other Ambulatory Visit: Payer: Self-pay | Admitting: Certified Nurse Midwife

## 2018-01-11 MED ORDER — SECNIDAZOLE 2 G PO PACK: 1.0000 | PACK | Freq: Once | ORAL | 0 refills | Status: AC

## 2018-02-01 ENCOUNTER — Encounter: Payer: Medicaid Other | Admitting: Certified Nurse Midwife

## 2018-03-21 ENCOUNTER — Emergency Department
Admission: EM | Admit: 2018-03-21 | Discharge: 2018-03-21 | Disposition: A | Discharge status: Home / Self Care | Payer: BLUE CROSS/BLUE SHIELD | Attending: Emergency Medicine | Admitting: Emergency Medicine

## 2018-03-21 ENCOUNTER — Other Ambulatory Visit: Payer: Self-pay

## 2018-03-21 DIAGNOSIS — J45909 Unspecified asthma, uncomplicated: Secondary | ICD-10-CM | POA: Insufficient documentation

## 2018-03-21 DIAGNOSIS — Z79899 Other long term (current) drug therapy: Secondary | ICD-10-CM | POA: Diagnosis not present

## 2018-03-21 DIAGNOSIS — B9789 Other viral agents as the cause of diseases classified elsewhere: Secondary | ICD-10-CM

## 2018-03-21 DIAGNOSIS — R05 Cough: Secondary | ICD-10-CM | POA: Diagnosis not present

## 2018-03-21 DIAGNOSIS — J069 Acute upper respiratory infection, unspecified: Secondary | ICD-10-CM | POA: Diagnosis not present

## 2018-03-21 MED ORDER — BENZONATATE 100 MG PO CAPS: 100.0000 mg | ORAL_CAPSULE | Freq: Three times a day (TID) | ORAL | 0 refills | Status: AC | PRN

## 2018-03-21 MED ORDER — FLUTICASONE PROPIONATE 50 MCG/ACT NA SUSP: 1.0000 | Freq: Every day | NASAL | 2 refills | Status: DC

## 2018-03-21 NOTE — ED Triage Notes (Signed)
Cough and sinus pressure x 2 days. Mask in place. A&O, ambulatory.

## 2018-03-21 NOTE — ED Provider Notes (Signed)
[facility] Emergency Department Provider Note  ____________________________________________  Time seen: Approximately 3:07 PM  I have reviewed the triage vital signs and the nursing notes.   HISTORY  Chief Complaint Cough    HPI [name] [name] is a 23 y.o. female presents to the emergency department with rhinorrhea, congestion and nonproductive cough for the past 2 days.  Has had chills.  No emesis or diarrhea.  No abdominal discomfort.  Patient denies exposure to sick contacts in the home.  She denies chest tightness, chest pain, nausea, vomiting or abdominal pain.  No alleviating measures have been attempted.   Past Medical History:  Diagnosis Date  . Abdominal pain   . Anal fissure   . Asthma   . Constipation   . H/O chlamydia infection 10/30/2015  . Headache     Patient Active Problem List   Diagnosis Date Noted  . History of postpartum depression 08/24/2016  . Constipation   . Anal fissure     Past Surgical History:  Procedure Laterality Date  . KNEE SURGERY Left 2014, 2015    Prior to Admission medications   Medication Sig Start Date End Date Taking? Authorizing Provider  albuterol (PROAIR HFA) 108 (90 Base) MCG/ACT inhaler Inhale into the lungs. 05/07/15   [provider]  benzonatate (TESSALON PERLES) 100 MG capsule Take 1 capsule (100 mg total) by mouth 3 (three) times daily as needed for up to 7 days for cough. 03/21/18 03/28/18  Orvil Feil, PA-C  fluticasone (FLONASE) 50 MCG/ACT nasal spray Place 1 spray into both nostrils daily. 03/21/18 03/21/19  Orvil Feil, PA-C  norelgestromin-ethinyl estradiol Burr Medico) 150-35 MCG/24HR transdermal patch Place 1 patch onto the skin once a week. 01/05/18   Lawhorn, Vanessa Payson, CNM  PARoxetine (PAXIL) 10 MG tablet Take 1 tablet (10 mg total) by mouth daily. May increase by 10 mg intervals weekly as needed. 01/05/18   [name], CNM    Allergies Ibuprofen and  Naproxen  Family History  Problem Relation Age of Onset  . Cancer Sister   . Seizures Sister   . Migraines Mother   . Diabetes Paternal Grandmother   . Celiac disease Neg Hx   . Cholelithiasis Neg Hx   . Ulcers Neg Hx   . Breast cancer Neg Hx   . Ovarian cancer Neg Hx   . Colon cancer Neg Hx     Social History Social History   Tobacco Use  . Smoking status: Never Smoker  . Smokeless tobacco: Never Used  Substance Use Topics  . Alcohol use: No  . Drug use: No      Review of Systems  Constitutional: Patient has chills.  Eyes: No visual changes. No discharge ENT: Patient has congestion.  Cardiovascular: no chest pain. Respiratory: Patient has cough.  Gastrointestinal: No abdominal pain.  No nausea, no vomiting. No diarrhea.  Genitourinary: Negative for dysuria. No hematuria Musculoskeletal: Patient has myalgias.  Skin: Negative for rash, abrasions, lacerations, ecchymosis. Neurological: Patient has headache, no focal weakness or numbness.     ____________________________________________   PHYSICAL EXAM:  VITAL SIGNS: ED Triage Vitals [03/21/18 1259]  Enc Vitals Group     BP (!) 147/72     Pulse Rate 93     Resp 16     Temp 99.4 F (37.4 C)     Temp Source Oral     SpO2 99 %     Weight 170 lb (77.1 kg)     Height 5'  3" (1.6 m)     Head Circumference      Peak Flow      Pain Score 9     Pain Loc      Pain Edu?      Excl. in GC?      Constitutional: Alert and oriented. Patient is lying supine. Eyes: Conjunctivae are normal. PERRL. EOMI. Head: Atraumatic. ENT:      Ears: Tympanic membranes are mildly injected with mild effusion bilaterally.       Nose: No congestion/rhinnorhea.      Mouth/Throat: Mucous membranes are moist. Posterior pharynx is mildly erythematous.  Hematological/Lymphatic/Immunilogical: No cervical lymphadenopathy.  Cardiovascular: Normal rate, regular rhythm. Normal S1 and S2.  Good peripheral circulation. Respiratory: Normal  respiratory effort without tachypnea or retractions. Lungs CTAB. Good air entry to the bases with no decreased or absent breath sounds. Gastrointestinal: Bowel sounds 4 quadrants. Soft and nontender to palpation. No guarding or rigidity. No palpable masses. No distention. No CVA tenderness. Musculoskeletal: Full range of motion to all extremities. No gross deformities appreciated. Neurologic:  Normal speech and language. No gross focal neurologic deficits are appreciated.  Skin:  Skin is warm, dry and intact. No rash noted. Psychiatric: Mood and affect are normal. Speech and behavior are normal. Patient exhibits appropriate insight and judgement.   ____________________________________________   LABS (all labs ordered are listed, but only abnormal results are displayed)  Labs Reviewed - No data to display ____________________________________________  EKG   ____________________________________________  RADIOLOGY   No results found.  ____________________________________________    PROCEDURES  Procedure(s) performed:    Procedures    Medications - No data to display   ____________________________________________   INITIAL IMPRESSION / ASSESSMENT AND PLAN / ED COURSE  Pertinent labs & imaging results that were available during my care of the patient were reviewed by me and considered in my medical decision making (see chart for details).  Review of the  [place]RS was performed in accordance of the [facility] prior to dispensing any controlled drugs.      Assessment and plan Viral URI with cough Patient presents to the emergency department with 2 days of rhinorrhea, congestion and nonproductive cough.  Vital signs remained reassuring throughout emergency department course.  Patient was discharged with [facility] for cough and Flonase.  She was advised to follow-up with primary care as needed.  All patient questions were  answered.     ____________________________________________  FINAL CLINICAL IMPRESSION(S) / ED DIAGNOSES  Final diagnoses:  Viral URI with cough      NEW MEDICATIONS STARTED DURING THIS VISIT:  ED Discharge Orders         Ordered    benzonatate (TESSALON PERLES) 100 MG capsule  3 times daily PRN     03/21/18 1505    fluticasone (FLONASE) 50 MCG/ACT nasal spray  Daily     03/21/18 1505              This chart was dictated using voice recognition software/Dragon. Despite best efforts to proofread, errors can occur which can change the meaning. Any change was purely unintentional.    Orvil Feil, PA-C 03/21/18 1510    Jene Every, MD 03/21/18 [phone]

## 2018-03-21 NOTE — ED Notes (Signed)
Pt reports cough since Thursday, reports she has been having hot flashes, cough is productive at times. Pt has hx of asthma, states she has not used an inhaler since May when she was pregnant.

## 2018-04-14 ENCOUNTER — Encounter: Payer: Self-pay | Admitting: Advanced Practice Midwife

## 2018-07-02 ENCOUNTER — Encounter: Payer: Self-pay | Admitting: Certified Nurse Midwife

## 2018-07-07 ENCOUNTER — Other Ambulatory Visit: Payer: Self-pay

## 2018-07-07 MED ORDER — NORETHIN ACE-ETH ESTRAD-FE 1-20 MG-MCG PO TABS: 1.0000 | ORAL_TABLET | Freq: Every day | ORAL | 9 refills | Status: DC

## 2018-08-03 DIAGNOSIS — R51 Headache: Secondary | ICD-10-CM | POA: Diagnosis not present

## 2018-08-03 DIAGNOSIS — M9903 Segmental and somatic dysfunction of lumbar region: Secondary | ICD-10-CM | POA: Diagnosis not present

## 2018-08-03 DIAGNOSIS — M5416 Radiculopathy, lumbar region: Secondary | ICD-10-CM | POA: Diagnosis not present

## 2018-08-03 DIAGNOSIS — M9901 Segmental and somatic dysfunction of cervical region: Secondary | ICD-10-CM | POA: Diagnosis not present

## 2018-08-04 DIAGNOSIS — M9903 Segmental and somatic dysfunction of lumbar region: Secondary | ICD-10-CM | POA: Diagnosis not present

## 2018-08-04 DIAGNOSIS — M5416 Radiculopathy, lumbar region: Secondary | ICD-10-CM | POA: Diagnosis not present

## 2018-08-04 DIAGNOSIS — M9901 Segmental and somatic dysfunction of cervical region: Secondary | ICD-10-CM | POA: Diagnosis not present

## 2018-08-04 DIAGNOSIS — R51 Headache: Secondary | ICD-10-CM | POA: Diagnosis not present

## 2018-10-12 ENCOUNTER — Emergency Department: Payer: BC Managed Care – PPO

## 2018-10-12 ENCOUNTER — Encounter: Payer: Self-pay | Admitting: Emergency Medicine

## 2018-10-12 ENCOUNTER — Other Ambulatory Visit: Payer: Self-pay

## 2018-10-12 ENCOUNTER — Emergency Department
Admission: EM | Admit: 2018-10-12 | Discharge: 2018-10-12 | Disposition: A | Discharge status: Home / Self Care | Payer: BC Managed Care – PPO | Attending: Student in an Organized Health Care Education/Training Program | Admitting: Student in an Organized Health Care Education/Training Program

## 2018-10-12 DIAGNOSIS — J45909 Unspecified asthma, uncomplicated: Secondary | ICD-10-CM | POA: Insufficient documentation

## 2018-10-12 DIAGNOSIS — Y998 Other external cause status: Secondary | ICD-10-CM | POA: Diagnosis not present

## 2018-10-12 DIAGNOSIS — S0083XA Contusion of other part of head, initial encounter: Secondary | ICD-10-CM | POA: Insufficient documentation

## 2018-10-12 DIAGNOSIS — Z79899 Other long term (current) drug therapy: Secondary | ICD-10-CM | POA: Insufficient documentation

## 2018-10-12 DIAGNOSIS — R110 Nausea: Secondary | ICD-10-CM | POA: Insufficient documentation

## 2018-10-12 DIAGNOSIS — H538 Other visual disturbances: Secondary | ICD-10-CM | POA: Diagnosis not present

## 2018-10-12 DIAGNOSIS — Y9389 Activity, other specified: Secondary | ICD-10-CM | POA: Diagnosis not present

## 2018-10-12 DIAGNOSIS — S0990XA Unspecified injury of head, initial encounter: Secondary | ICD-10-CM | POA: Diagnosis not present

## 2018-10-12 DIAGNOSIS — R51 Headache: Secondary | ICD-10-CM | POA: Diagnosis not present

## 2018-10-12 DIAGNOSIS — W01198A Fall on same level from slipping, tripping and stumbling with subsequent striking against other object, initial encounter: Secondary | ICD-10-CM | POA: Insufficient documentation

## 2018-10-12 DIAGNOSIS — F0781 Postconcussional syndrome: Secondary | ICD-10-CM | POA: Insufficient documentation

## 2018-10-12 DIAGNOSIS — M542 Cervicalgia: Secondary | ICD-10-CM | POA: Diagnosis not present

## 2018-10-12 DIAGNOSIS — Y92012 Bathroom of single-family (private) house as the place of occurrence of the external cause: Secondary | ICD-10-CM | POA: Insufficient documentation

## 2018-10-12 DIAGNOSIS — S199XXA Unspecified injury of neck, initial encounter: Secondary | ICD-10-CM | POA: Diagnosis not present

## 2018-10-12 LAB — POCT PREGNANCY, URINE: Preg Test, Ur: NEGATIVE

## 2018-10-12 MED ORDER — ACETAMINOPHEN 325 MG PO TABS
650.0000 mg | ORAL_TABLET | Freq: Once | ORAL | Status: AC
  Administered 2018-10-12: 650 mg via ORAL
  Filled 2018-10-12: qty 2

## 2018-10-12 MED ORDER — PREDNISONE 20 MG PO TABS: 20.0000 mg | ORAL_TABLET | Freq: Two times a day (BID) | ORAL | 0 refills | Status: AC

## 2018-10-12 MED ORDER — METOCLOPRAMIDE HCL 5 MG PO TABS: 5.0000 mg | ORAL_TABLET | Freq: Three times a day (TID) | ORAL | 0 refills | Status: DC | PRN

## 2018-10-12 MED ORDER — METOCLOPRAMIDE HCL 10 MG PO TABS
10.0000 mg | ORAL_TABLET | Freq: Once | ORAL | Status: AC
Start: 2018-10-12 — End: 2018-10-12
  Administered 2018-10-12: 10 mg via ORAL
  Filled 2018-10-12: qty 1
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
## 2018-10-12 NOTE — Discharge Instructions (Signed)
Your exam and CT scans are normal and reassuring following your fall at home. There is no evidence of a serious head injury or fracture to the spine. You do have a facial contusion and post-concussive symptoms. These may persist for days or weeks. Take the prescription meds as directed. You may take OTC Benadryl, as directed, for additional headache relief. Follow-up with your provider or return to the ED for worsening symptoms.

## 2018-11-06 DIAGNOSIS — Z20828 Contact with and (suspected) exposure to other viral communicable diseases: Secondary | ICD-10-CM | POA: Diagnosis not present

## 2018-11-06 DIAGNOSIS — J069 Acute upper respiratory infection, unspecified: Secondary | ICD-10-CM | POA: Diagnosis not present

## 2018-12-18 ENCOUNTER — Telehealth: Payer: Self-pay

## 2018-12-18 NOTE — Telephone Encounter (Signed)
Pt. Called to inquire about need to have COVID testing, due to recent exposure by friend, that tested positive.  Stated the friend was in her home recently, and then reported she tested positive the day after she was there.  Pt. Reported concern for herself and 2 small children.  Reported the children started having runny noses.  Denied having any symptoms herself.  Advised that she and the children can be tested at a [facility] community testing site.  Advised on location of [employer] in [place].  Advised that it is a drive up testing site, remain in car, and wear mask.  Encouraged that the family should quarantine for 14 days, due to direct exposure.  Verb. Understanding.

## 2018-12-20 ENCOUNTER — Other Ambulatory Visit: Payer: Self-pay

## 2018-12-20 DIAGNOSIS — Z20828 Contact with and (suspected) exposure to other viral communicable diseases: Secondary | ICD-10-CM | POA: Diagnosis not present

## 2018-12-20 DIAGNOSIS — Z20822 Contact with and (suspected) exposure to covid-19: Secondary | ICD-10-CM

## 2018-12-20 NOTE — Progress Notes (Unsigned)
[id number] 

## 2018-12-22 LAB — NOVEL CORONAVIRUS, NAA: SARS-CoV-2, NAA: NOT DETECTED

## 2019-01-13 ENCOUNTER — Telehealth: Payer: Self-pay | Admitting: Certified Nurse Midwife

## 2019-01-13 NOTE — Telephone Encounter (Signed)
Patient sent a MyChart request, requesting an appointment today with Sharyn Lull. Please let me know if I am able to add the patient onto the schedule. Please advise.

## 2019-01-13 NOTE — Telephone Encounter (Signed)
Ok to schedule for problem visit today.  Thanks.

## 2019-01-13 NOTE — Telephone Encounter (Signed)
Infomed by DM pt may be added on if patient is having issue/problem. Called pt to scheduled apt no answer and vm is not set up. Please advise.

## 2019-01-14 ENCOUNTER — Other Ambulatory Visit: Payer: Self-pay

## 2019-01-14 ENCOUNTER — Ambulatory Visit (INDEPENDENT_AMBULATORY_CARE_PROVIDER_SITE_OTHER): Payer: BC Managed Care – PPO | Admitting: Certified Nurse Midwife

## 2019-01-14 ENCOUNTER — Encounter: Payer: Self-pay | Admitting: Certified Nurse Midwife

## 2019-01-14 VITALS — BP 93/65 | HR 92 | Ht 63.0 in | Wt 172.4 lb

## 2019-01-14 DIAGNOSIS — Z113 Encounter for screening for infections with a predominantly sexual mode of transmission: Secondary | ICD-10-CM | POA: Diagnosis not present

## 2019-01-14 DIAGNOSIS — Z6830 Body mass index (BMI) 30.0-30.9, adult: Secondary | ICD-10-CM

## 2019-01-14 DIAGNOSIS — Z3492 Encounter for supervision of normal pregnancy, unspecified, second trimester: Secondary | ICD-10-CM

## 2019-01-14 DIAGNOSIS — Z3687 Encounter for antenatal screening for uncertain dates: Secondary | ICD-10-CM

## 2019-01-14 DIAGNOSIS — Z0283 Encounter for blood-alcohol and blood-drug test: Secondary | ICD-10-CM

## 2019-01-14 DIAGNOSIS — Z130 Encounter for screening for diseases of the blood and blood-forming organs and certain disorders involving the immune mechanism: Secondary | ICD-10-CM

## 2019-01-14 DIAGNOSIS — N912 Amenorrhea, unspecified: Secondary | ICD-10-CM

## 2019-01-14 LAB — OB RESULTS CONSOLE GC/CHLAMYDIA: Gonorrhea: NEGATIVE

## 2019-01-14 LAB — POCT URINE PREGNANCY: Preg Test, Ur: POSITIVE — AB

## 2019-01-14 LAB — OB RESULTS CONSOLE VARICELLA ZOSTER ANTIBODY, IGG: Varicella: IMMUNE

## 2019-01-14 NOTE — Progress Notes (Signed)
NEW OB HISTORY AND PHYSICAL  SUBJECTIVE:       [name] [name] is a 23 y.o. [id number] female, Patient's last menstrual period was 10/11/2018 (approximate)., Estimated Date of Delivery: 07/18/19, [redacted]w[redacted]d, presents today for establishment of Prenatal Care.  Reports nausea without vomiting and intermittent lower abdominal cramping.   Denies difficulty breathing or respiratory distress, chest pain, dysuria and leg pain or swelling.   Declines genetic screening. Desires midwifery care.    Gynecologic History  Patient's last menstrual period was 10/11/2018 (approximate).  Period Cycle (Days): 28 Period Duration (Days): 7 Period Pattern: Regular Menstrual Flow: Moderate Menstrual Control: Tampon, Thin pad Dysmenorrhea: (!) Moderate Dysmenorrhea Symptoms: Cramping   Estimated date of birth: 07[birth year]2019 by unsure LMP  Contraception: OCP (estrogen/progesterone), Loestrin Fe  Last Pap: 01/05/2018. Results were: normal  Obstetric History OB History  Gravida Para Term Preterm AB Living  3 2 2     2   SAB TAB Ectopic Multiple Live Births        0 2    # Outcome Date GA Lbr Len/2nd Weight Sex Delivery Anes PTL Lv  3 Current           2 Term 08/04/17 [redacted]w[redacted]d / 00:08 7 lb 15.3 oz (3.61 kg) F Vag-Spont EPI N LIV  1 Term 06/06/16 [redacted]w[redacted]d  9 lb (4.082 kg) M Vag-Spont EPI N LIV    Past Medical History:  Diagnosis Date  . Abdominal pain   . Anal fissure   . Asthma   . Constipation   . H/O chlamydia infection 10/30/2015  . Headache     Past Surgical History:  Procedure Laterality Date  . KNEE SURGERY Left 2014, 2015    Current Outpatient Medications on File Prior to Visit  Medication Sig Dispense Refill  . albuterol (PROAIR HFA) 108 (90 Base) MCG/ACT inhaler Inhale into the lungs as needed.      No current facility-administered medications on file prior to visit.     Allergies  Allergen Reactions  . Ibuprofen Nausea Only and Other (See Comments)    ibuprofen 800 mg - Vomiting and Shaking   . Naproxen Itching and Nausea Only    Social History   Socioeconomic History  . Marital status: Single    Spouse name: Not on file  . Number of children: Not on file  . Years of education: Not on file  . Highest education level: Not on file  Occupational History  . Not on file  Social Needs  . Financial resource strain: Not on file  . Food insecurity    Worry: Not on file    Inability: Not on file  . Transportation needs    Medical: Not on file    Non-medical: Not on file  Tobacco Use  . Smoking status: Never Smoker  . Smokeless tobacco: Never Used  Substance and Sexual Activity  . Alcohol use: Not Currently  . Drug use: No  . Sexual activity: Yes    Partners: Male    Birth control/protection: None  Lifestyle  . Physical activity    Days per week: Not on file    Minutes per session: Not on file  . Stress: Not on file  Relationships  . Social Herbalist on phone: Not on file    Gets together: Not on file    Attends religious service: Not on file    Active member of club or organization: Not on file    Attends meetings of clubs or organizations:  Not on file    Relationship status: Not on file  . Intimate partner violence    Fear of current or ex partner: Not on file    Emotionally abused: Not on file    Physically abused: Not on file    Forced sexual activity: Not on file  Other Topics Concern  . Not on file  Social History Narrative  . Not on file    Family History  Problem Relation Age of Onset  . Cancer Sister   . Seizures Sister   . Migraines Mother   . Diabetes Paternal Grandmother   . Celiac disease Neg Hx   . Cholelithiasis Neg Hx   . Ulcers Neg Hx   . Breast cancer Neg Hx   . Ovarian cancer Neg Hx   . Colon cancer Neg Hx     The following portions of the patient's history were reviewed and updated as appropriate: allergies, current medications, past OB history, past medical history, past surgical history, past family history, past  social history, and problem list.  Review of Systems:  ROS negative except as noted above. Information obtained from patient.   OBJECTIVE:  BP 93/65   Pulse 92   Ht 5\' 3"  (1.6 m)   Wt 172 lb 6.4 oz (78.2 kg)   LMP 10/11/2018 (Approximate)   BMI 30.54 kg/m   Initial Physical Exam (New OB)  GENERAL APPEARANCE: alert, well appearing, in no apparent distress  HEAD: normocephalic, atraumatic  MOUTH: mucous membranes moist, pharynx normal without lesions  THYROID: not examined  BREASTS: not examined  LUNGS: clear to auscultation, no wheezes, rales or rhonchi, symmetric air entry  HEART: regular rate and rhythm, no murmurs  ABDOMEN: soft, nontender, nondistended, no abnormal masses, no epigastric pain and FHT present  EXTREMITIES: no redness or tenderness in the calves or thighs, no edema  SKIN: normal coloration and turgor, no rashes  LYMPH NODES: no adenopathy palpable  NEUROLOGIC: alert, oriented, normal speech, no focal findings or movement disorder noted  PELVIC EXAM: deferred, no complaints  ASSESSMENT: Normal pregnancy Size greater than dates-unsure LMP Rh positive Declines genetic screening Desires midwifery care  PLAN: Prenatal care New OB counseling: The patient has been given an overview regarding routine prenatal care. Recommendations regarding diet, weight gain, and exercise in pregnancy were given. Prenatal testing, optional genetic testing, and ultrasound use in pregnancy were reviewed.  Benefits of Breast Feeding were discussed. The patient is encouraged to consider nursing her baby post partum. RTC x Tuesday for ultrasound; RTC x 4-5 weeks for ROB or sooner if needed See orders

## 2019-01-14 NOTE — Patient Instructions (Signed)

## 2019-01-14 NOTE — Progress Notes (Signed)
[name] [name] presents for NOB nurse interview visit.  Pregnancy confirmation done today at [facility].  G-3.  P-2.  LMP ~10/11/2018.  EDD 07/15/2019.  Dating scan will be ordered.  Pregnancy education material explained and given.  No cats in the home.  NOB labs ordered.  TSH/HgA1C ordered due to increased BMI.  Body mass index is 30.54 kg/m.  Sickle cell ordered. HIV and drug screen were explained and ordered.  PNV encouraged.  Genetic screening options discussed.  Genetic testing: Unsure.  FMLA form signed and financial policy reviewed.

## 2019-01-15 LAB — URINALYSIS, ROUTINE W REFLEX MICROSCOPIC
Bilirubin, UA: NEGATIVE
Glucose, UA: NEGATIVE
Ketones, UA: NEGATIVE
Leukocytes,UA: NEGATIVE
Nitrite, UA: NEGATIVE
Protein,UA: NEGATIVE
RBC, UA: NEGATIVE
Specific Gravity, UA: 1.021 (ref 1.005–1.030)
Urobilinogen, Ur: 0.2 mg/dL (ref 0.2–1.0)
pH, UA: 8 — ABNORMAL HIGH (ref 5.0–7.5)

## 2019-01-16 LAB — CBC WITH DIFFERENTIAL/PLATELET
Basophils Absolute: 0.1 10*3/uL (ref 0.0–0.2)
Basos: 1 %
EOS (ABSOLUTE): 0.1 10*3/uL (ref 0.0–0.4)
Eos: 1 %
Hematocrit: 36.7 % (ref 34.0–46.6)
Hemoglobin: 12.1 g/dL (ref 11.1–15.9)
Immature Grans (Abs): 0 10*3/uL (ref 0.0–0.1)
Immature Granulocytes: 0 %
Lymphocytes Absolute: 1.8 10*3/uL (ref 0.7–3.1)
Lymphs: 25 %
MCH: 25.6 pg — ABNORMAL LOW (ref 26.6–33.0)
MCHC: 33 g/dL (ref 31.5–35.7)
MCV: 78 fL — ABNORMAL LOW (ref 79–97)
Monocytes Absolute: 0.4 10*3/uL (ref 0.1–0.9)
Monocytes: 5 %
Neutrophils Absolute: 5.1 10*3/uL (ref 1.4–7.0)
Neutrophils: 68 %
Platelets: 231 10*3/uL (ref 150–450)
RBC: 4.72 x10E6/uL (ref 3.77–5.28)
RDW: 13.8 % (ref 11.7–15.4)
WBC: 7.4 10*3/uL (ref 3.4–10.8)

## 2019-01-16 LAB — VARICELLA ZOSTER ANTIBODY, IGG: Varicella zoster IgG: 453 index (ref >165)

## 2019-01-16 LAB — ABO AND RH: Rh Factor: POSITIVE

## 2019-01-16 LAB — HEMOGLOBIN A1C
Est. average glucose Bld gHb Est-mCnc: 94 mg/dL
Hgb A1c MFr Bld: 4.9 % (ref 4.8–5.6)

## 2019-01-16 LAB — ANTIBODY SCREEN: Antibody Screen: NEGATIVE

## 2019-01-16 LAB — HIV ANTIBODY (ROUTINE TESTING W REFLEX): HIV Screen 4th Generation wRfx: NONREACTIVE

## 2019-01-16 LAB — TSH: TSH: 1.06 u[IU]/mL (ref 0.450–4.500)

## 2019-01-16 LAB — URINE CULTURE, OB REFLEX

## 2019-01-16 LAB — HEPATITIS B SURFACE ANTIGEN: Hepatitis B Surface Ag: NEGATIVE

## 2019-01-16 LAB — SICKLE CELL SCREEN: Sickle Cell Screen: NEGATIVE

## 2019-01-16 LAB — RUBELLA SCREEN: Rubella Antibodies, IGG: 6.72 index (ref >0.99)

## 2019-01-16 LAB — RPR: RPR Ser Ql: NONREACTIVE

## 2019-01-16 LAB — CULTURE, OB URINE

## 2019-01-17 ENCOUNTER — Encounter: Payer: Self-pay | Admitting: Certified Nurse Midwife

## 2019-01-17 DIAGNOSIS — F1290 Cannabis use, unspecified, uncomplicated: Secondary | ICD-10-CM | POA: Insufficient documentation

## 2019-01-17 LAB — MONITOR DRUG PROFILE 14(MW)
Amphetamine Scrn, Ur: NEGATIVE ng/mL
BARBITURATE SCREEN URINE: NEGATIVE ng/mL
BENZODIAZEPINE SCREEN, URINE: NEGATIVE ng/mL
Buprenorphine, Urine: NEGATIVE ng/mL
Cocaine (Metab) Scrn, Ur: NEGATIVE ng/mL
Creatinine(Crt), U: 105.1 mg/dL (ref 20.0–300.0)
Fentanyl, Urine: NEGATIVE pg/mL
Meperidine Screen, Urine: NEGATIVE ng/mL
Methadone Screen, Urine: NEGATIVE ng/mL
OXYCODONE+OXYMORPHONE UR QL SCN: NEGATIVE ng/mL
Opiate Scrn, Ur: NEGATIVE ng/mL
Ph of Urine: 8.1 (ref 4.5–8.9)
Phencyclidine Qn, Ur: NEGATIVE ng/mL
Propoxyphene Scrn, Ur: NEGATIVE ng/mL
SPECIFIC GRAVITY: 1.024
Tramadol Screen, Urine: NEGATIVE ng/mL

## 2019-01-17 LAB — CANNABINOID (GC/MS), URINE
Cannabinoid: POSITIVE — AB
Carboxy THC (GC/MS): 72 ng/mL

## 2019-01-17 LAB — NICOTINE SCREEN, URINE: Cotinine Ql Scrn, Ur: NEGATIVE ng/mL

## 2019-01-18 ENCOUNTER — Ambulatory Visit (INDEPENDENT_AMBULATORY_CARE_PROVIDER_SITE_OTHER): Payer: BC Managed Care – PPO

## 2019-01-18 ENCOUNTER — Other Ambulatory Visit: Payer: Self-pay

## 2019-01-18 DIAGNOSIS — Z3687 Encounter for antenatal screening for uncertain dates: Secondary | ICD-10-CM | POA: Diagnosis not present

## 2019-01-18 DIAGNOSIS — Z3492 Encounter for supervision of normal pregnancy, unspecified, second trimester: Secondary | ICD-10-CM

## 2019-01-18 DIAGNOSIS — N912 Amenorrhea, unspecified: Secondary | ICD-10-CM

## 2019-01-18 LAB — GC/CHLAMYDIA PROBE AMP
Chlamydia trachomatis, NAA: NEGATIVE
Neisseria Gonorrhoeae by PCR: NEGATIVE

## 2019-02-14 ENCOUNTER — Encounter: Payer: BC Managed Care – PPO | Admitting: Certified Nurse Midwife

## 2019-02-15 ENCOUNTER — Encounter: Payer: BC Managed Care – PPO | Admitting: Certified Nurse Midwife

## 2019-02-15 ENCOUNTER — Other Ambulatory Visit: Payer: BC Managed Care – PPO

## 2019-02-15 ENCOUNTER — Other Ambulatory Visit: Payer: Self-pay

## 2019-02-15 DIAGNOSIS — Z20822 Contact with and (suspected) exposure to covid-19: Secondary | ICD-10-CM

## 2019-02-16 LAB — NOVEL CORONAVIRUS, NAA: SARS-CoV-2, NAA: NOT DETECTED

## 2019-02-17 ENCOUNTER — Other Ambulatory Visit: Payer: Self-pay

## 2019-02-17 ENCOUNTER — Encounter: Payer: Self-pay | Admitting: Certified Nurse Midwife

## 2019-02-17 ENCOUNTER — Ambulatory Visit: Payer: BC Managed Care – PPO

## 2019-02-18 ENCOUNTER — Encounter: Payer: BC Managed Care – PPO | Admitting: Certified Nurse Midwife

## 2019-02-22 ENCOUNTER — Encounter: Payer: BC Managed Care – PPO | Admitting: Certified Nurse Midwife

## 2019-02-22 ENCOUNTER — Ambulatory Visit (INDEPENDENT_AMBULATORY_CARE_PROVIDER_SITE_OTHER): Payer: BC Managed Care – PPO

## 2019-02-22 ENCOUNTER — Other Ambulatory Visit: Payer: Self-pay

## 2019-02-22 DIAGNOSIS — Z363 Encounter for antenatal screening for malformations: Secondary | ICD-10-CM | POA: Diagnosis not present

## 2019-02-25 ENCOUNTER — Encounter: Payer: BC Managed Care – PPO | Admitting: Certified Nurse Midwife

## 2019-03-01 ENCOUNTER — Encounter: Payer: BC Managed Care – PPO | Admitting: Certified Nurse Midwife

## 2019-03-01 ENCOUNTER — Other Ambulatory Visit: Payer: Self-pay

## 2019-03-01 ENCOUNTER — Ambulatory Visit (INDEPENDENT_AMBULATORY_CARE_PROVIDER_SITE_OTHER): Payer: BC Managed Care – PPO | Admitting: Certified Nurse Midwife

## 2019-03-01 VITALS — BP 102/58 | HR 92 | Wt 176.6 lb

## 2019-03-01 DIAGNOSIS — N3000 Acute cystitis without hematuria: Secondary | ICD-10-CM

## 2019-03-01 DIAGNOSIS — M549 Dorsalgia, unspecified: Secondary | ICD-10-CM

## 2019-03-01 DIAGNOSIS — O99891 Other specified diseases and conditions complicating pregnancy: Secondary | ICD-10-CM

## 2019-03-01 DIAGNOSIS — Z3492 Encounter for supervision of normal pregnancy, unspecified, second trimester: Secondary | ICD-10-CM

## 2019-03-01 LAB — POCT URINALYSIS DIPSTICK OB
Bilirubin, UA: NEGATIVE
Blood, UA: NEGATIVE
Glucose, UA: NEGATIVE
Ketones, UA: NEGATIVE
Leukocytes, UA: NEGATIVE
Nitrite, UA: NEGATIVE
POC,PROTEIN,UA: NEGATIVE
Spec Grav, UA: 1.01 (ref 1.010–1.025)
Urobilinogen, UA: 0.2 E.U./dL
pH, UA: 5 (ref 5.0–8.0)

## 2019-03-01 NOTE — Progress Notes (Signed)
ROB doing well. Feels good movement.Pt state she thinks she pinched nerve in her back. Encouraged tylenol, ice to back , stretches. Order placed for referral to chiropractor. Anticipatory guidance on next few appointment. Discussed staffing changes. Pt verbalizes and agrees to plan of care. Follow up 4 wks.   Philip Aspen, CNM

## 2019-03-01 NOTE — Patient Instructions (Signed)

## 2019-03-01 NOTE — Addendum Note (Signed)
Addended by: Raliegh Ip on: 03/01/2019 04:06 PM   Modules accepted: Orders

## 2019-03-18 NOTE — L&D Delivery Note (Signed)
Delivery Note  In room to see patient, fetal vertex visible with contraction at 0738.   Spontaneous vaginal birth of liveborn female infant at 46 in right occiput anterior position over intact perineum. Infant immediately to maternal abdomen. Delayed cord clamping, skin to skin, three (3) vessel cord, and cord blood collected. APGARs: 7, 9. Weight pending. Receiving nurse present at bedside for birth.   Pitocin bolus infusing. Spontaneous delivery of intact placenta at 0747. Vault check completed under epidural anesthesia. No lacerations. EBL: 300 ml.  Initiate routine postpartum care and orders. Mom to postpartum.  Baby to Couplet care / Skin to Skin.  Mother of patient present at bedside and overjoyed with the birth of "[name] [name]".    [name], CNM [facility], [facility] 06/30/2019, 8:01 AM

## 2019-03-19 ENCOUNTER — Observation Stay
Admission: EM | Admit: 2019-03-19 | Discharge: 2019-03-19 | Disposition: A | Discharge status: Home / Self Care | Payer: BC Managed Care – PPO | Attending: Obstetrics and Gynecology | Admitting: Obstetrics and Gynecology

## 2019-03-19 ENCOUNTER — Other Ambulatory Visit: Payer: Self-pay

## 2019-03-19 ENCOUNTER — Encounter: Payer: Self-pay | Admitting: Obstetrics and Gynecology

## 2019-03-19 DIAGNOSIS — O26891 Other specified pregnancy related conditions, first trimester: Principal | ICD-10-CM | POA: Insufficient documentation

## 2019-03-19 DIAGNOSIS — O26892 Other specified pregnancy related conditions, second trimester: Secondary | ICD-10-CM

## 2019-03-19 DIAGNOSIS — Z3A24 24 weeks gestation of pregnancy: Secondary | ICD-10-CM | POA: Diagnosis not present

## 2019-03-19 LAB — URINALYSIS, ROUTINE W REFLEX MICROSCOPIC
Bilirubin Urine: NEGATIVE
Glucose, UA: NEGATIVE mg/dL
Hgb urine dipstick: NEGATIVE
Ketones, ur: NEGATIVE mg/dL
Nitrite: NEGATIVE
Protein, ur: NEGATIVE mg/dL
Specific Gravity, Urine: 1.016 (ref 1.005–1.030)
pH: 7 (ref 5.0–8.0)

## 2019-03-19 MED ORDER — NITROFURANTOIN MONOHYD MACRO 100 MG PO CAPS: 100.0000 mg | ORAL_CAPSULE | Freq: Two times a day (BID) | ORAL | 1 refills | Status: DC

## 2019-03-19 MED ORDER — NITROFURANTOIN MONOHYD MACRO 100 MG PO CAPS
100.0000 mg | ORAL_CAPSULE | Freq: Once | ORAL | Status: AC
  Administered 2019-03-19: 100 mg via ORAL
  Filled 2019-03-19: qty 1

## 2019-03-19 NOTE — Addendum Note (Signed)
Addended by: Elonda Husky on: 03/19/2019 11:11 AM   Modules accepted: Orders

## 2019-03-19 NOTE — Discharge Summary (Signed)
    L&D OB Triage Note  SUBJECTIVE Lorrain Frerichs is a 24 y.o. [id number] female at [redacted]w[redacted]d, EDD Estimated Date of Delivery: 07/04/19 who presented to triage with complaints of loss of mucous plug.  She denies contractions.  She does have some pelvic discomfort.  Denies vaginal bleeding  OB History  Gravida Para Term Preterm AB Living  3 2 2  0 0 2  SAB TAB Ectopic Multiple Live Births  0 0 0 0 2    # Outcome Date GA Lbr Len/2nd Weight Sex Delivery Anes PTL Lv  3 Current           2 Term 08/04/17 [redacted]w[redacted]d / 00:08 3610 g F Vag-Spont EPI N LIV     Name: Malani     Apgar1: 8  Apgar5: 9  1 Term 06/06/16 [redacted]w[redacted]d  4082 g M Vag-Spont EPI N LIV     Name: Jaxon    No medications prior to admission.     OBJECTIVE  Nursing Evaluation:   BP 115/66 (BP Location: Left Arm)   Pulse 95   Temp 98.4 F (36.9 C) (Oral)   Resp 16   Ht 5\' 3"  (1.6 m)   Wt 78.9 kg   LMP 10/11/2018 (Approximate)   SpO2 100%   BMI 30.82 kg/m    Findings:   Urine dip likely consistent with UTI.  Patient not in labor  NST was performed and has been reviewed by me.  NST INTERPRETATION: Appropriate for gestational age  Mode: External, Intermittent Baseline Rate (A): 150 bpm Variability: Moderate Accelerations: 15 x 15 Decelerations: Variable     Contraction Frequency (min): none  ASSESSMENT Impression:  1.  Pregnancy:  at [redacted]w[redacted]d , EDD Estimated Date of Delivery: 07/04/19 2.  NST:  Appropriate for gestational age  PLAN 1. Reassurance given 2. Discharge home with standard labor precautions given to return to L&D or call the office for problems. 3. Continue routine prenatal care. 4.  Macrobid 1 week

## 2019-03-29 ENCOUNTER — Encounter: Payer: BC Managed Care – PPO | Admitting: Certified Nurse Midwife

## 2019-04-01 ENCOUNTER — Encounter: Payer: Self-pay | Admitting: Certified Nurse Midwife

## 2019-04-01 ENCOUNTER — Other Ambulatory Visit: Payer: Self-pay

## 2019-04-01 ENCOUNTER — Ambulatory Visit (INDEPENDENT_AMBULATORY_CARE_PROVIDER_SITE_OTHER): Payer: BC Managed Care – PPO | Admitting: Certified Nurse Midwife

## 2019-04-01 VITALS — BP 112/59 | HR 99 | Wt 180.5 lb

## 2019-04-01 DIAGNOSIS — Z3492 Encounter for supervision of normal pregnancy, unspecified, second trimester: Secondary | ICD-10-CM

## 2019-04-01 LAB — POCT URINALYSIS DIPSTICK OB
Bilirubin, UA: NEGATIVE
Blood, UA: NEGATIVE
Glucose, UA: NEGATIVE
Ketones, UA: NEGATIVE
Leukocytes, UA: NEGATIVE
Nitrite, UA: NEGATIVE
POC,PROTEIN,UA: NEGATIVE
Spec Grav, UA: 1.015 (ref 1.010–1.025)
Urobilinogen, UA: 0.2 E.U./dL
pH, UA: 6 (ref 5.0–8.0)

## 2019-04-01 NOTE — Progress Notes (Signed)
ROB doing well. Feels good movement. Discussed 28 wk visit. Reviewed diet prior to glucose test. She verbalizes and agrees. Discussed weight gain her recommended total of 15-25 lbs. Encouraged her to watch her diet and increase exercise. She verbalizes understanding. Follow up 2 wks.   Doreene Burke, CNM

## 2019-04-01 NOTE — Patient Instructions (Signed)
Glucose Tolerance Test During Pregnancy Why am I having this test? The glucose tolerance test (GTT) is done to check how your body processes sugar (glucose). This is one of several tests used to diagnose diabetes that develops during pregnancy (gestational diabetes mellitus). Gestational diabetes is a temporary form of diabetes that some women develop during pregnancy. It usually occurs during the second trimester of pregnancy and goes away after delivery. Testing (screening) for gestational diabetes usually occurs between 24 and 28 weeks of pregnancy. You may have the GTT test after having a 1-hour glucose screening test if the results from that test indicate that you may have gestational diabetes. You may also have this test if:  You have a history of gestational diabetes.  You have a history of giving birth to very large babies or have experienced repeated fetal loss (stillbirth).  You have signs and symptoms of diabetes, such as: ? Changes in your vision. ? Tingling or numbness in your hands or feet. ? Changes in hunger, thirst, and urination that are not otherwise explained by your pregnancy. What is being tested? This test measures the amount of glucose in your blood at different times during a period of 3 hours. This indicates how well your body is able to process glucose. What kind of sample is taken?  Blood samples are required for this test. They are usually collected by inserting a needle into a blood vessel. How do I prepare for this test?  For 3 days before your test, eat normally. Have plenty of carbohydrate-rich foods.  Follow instructions from your health care provider about: ? Eating or drinking restrictions on the day of the test. You may be asked to not eat or drink anything other than water (fast) starting 8-10 hours before the test. ? Changing or stopping your regular medicines. Some medicines may interfere with this test. Tell a health care provider about:  All  medicines you are taking, including vitamins, herbs, eye drops, creams, and over-the-counter medicines.  Any blood disorders you have.  Any surgeries you have had.  Any medical conditions you have. What happens during the test? First, your blood glucose will be measured. This is referred to as your fasting blood glucose, since you fasted before the test. Then, you will drink a glucose solution that contains a certain amount of glucose. Your blood glucose will be measured again 1, 2, and 3 hours after drinking the solution. This test takes about 3 hours to complete. You will need to stay at the testing location during this time. During the testing period:  Do not eat or drink anything other than the glucose solution.  Do not exercise.  Do not use any products that contain nicotine or tobacco, such as cigarettes and e-cigarettes. If you need help stopping, ask your health care provider. The testing procedure may vary among health care providers and hospitals. How are the results reported? Your results will be reported as milligrams of glucose per deciliter of blood (mg/dL) or millimoles per liter (mmol/L). Your health care provider will compare your results to normal ranges that were established after testing a large group of people (reference ranges). Reference ranges may vary among labs and hospitals. For this test, common reference ranges are:  Fasting: less than 95-105 mg/dL (5.3-5.8 mmol/L).  1 hour after drinking glucose: less than 180-190 mg/dL (10.0-10.5 mmol/L).  2 hours after drinking glucose: less than 155-165 mg/dL (8.6-9.2 mmol/L).  3 hours after drinking glucose: 140-145 mg/dL (7.8-8.1 mmol/L). What do the   results mean? Results within reference ranges are considered normal, meaning that your glucose levels are well-controlled. If two or more of your blood glucose levels are high, you may be diagnosed with gestational diabetes. If only one level is high, your health care  provider may suggest repeat testing or other tests to confirm a diagnosis. Talk with your health care provider about what your results mean. Questions to ask your health care provider Ask your health care provider, or the department that is doing the test:  When will my results be ready?  How will I get my results?  What are my treatment options?  What other tests do I need?  What are my next steps? Summary  The glucose tolerance test (GTT) is one of several tests used to diagnose diabetes that develops during pregnancy (gestational diabetes mellitus). Gestational diabetes is a temporary form of diabetes that some women develop during pregnancy.  You may have the GTT test after having a 1-hour glucose screening test if the results from that test indicate that you may have gestational diabetes. You may also have this test if you have any symptoms or risk factors for gestational diabetes.  Talk with your health care provider about what your results mean. This information is not intended to replace advice given to you by your health care provider. Make sure you discuss any questions you have with your health care provider. Document Revised: 06/24/2018 Document Reviewed: 10/13/2016 Elsevier Patient Education  2020 Elsevier Inc.  

## 2019-04-22 ENCOUNTER — Other Ambulatory Visit (HOSPITAL_COMMUNITY)
Admission: RE | Admit: 2019-04-22 | Discharge: 2019-04-22 | Disposition: A | Discharge status: Home / Self Care | Payer: BC Managed Care – PPO | Source: Ambulatory Visit | Attending: Certified Nurse Midwife | Admitting: Certified Nurse Midwife

## 2019-04-22 ENCOUNTER — Other Ambulatory Visit: Payer: BC Managed Care – PPO

## 2019-04-22 ENCOUNTER — Other Ambulatory Visit: Payer: Self-pay

## 2019-04-22 ENCOUNTER — Ambulatory Visit (INDEPENDENT_AMBULATORY_CARE_PROVIDER_SITE_OTHER): Payer: BC Managed Care – PPO | Admitting: Certified Nurse Midwife

## 2019-04-22 VITALS — BP 103/66 | HR 103 | Wt 183.5 lb

## 2019-04-22 DIAGNOSIS — N390 Urinary tract infection, site not specified: Secondary | ICD-10-CM

## 2019-04-22 DIAGNOSIS — Z131 Encounter for screening for diabetes mellitus: Secondary | ICD-10-CM

## 2019-04-22 DIAGNOSIS — Z3492 Encounter for supervision of normal pregnancy, unspecified, second trimester: Secondary | ICD-10-CM | POA: Diagnosis not present

## 2019-04-22 DIAGNOSIS — Z3493 Encounter for supervision of normal pregnancy, unspecified, third trimester: Secondary | ICD-10-CM

## 2019-04-22 DIAGNOSIS — Z113 Encounter for screening for infections with a predominantly sexual mode of transmission: Secondary | ICD-10-CM

## 2019-04-22 DIAGNOSIS — Z3A28 28 weeks gestation of pregnancy: Secondary | ICD-10-CM | POA: Insufficient documentation

## 2019-04-22 LAB — POCT URINALYSIS DIPSTICK OB
Bilirubin, UA: NEGATIVE
Blood, UA: NEGATIVE
Glucose, UA: NEGATIVE
Ketones, UA: NEGATIVE
Leukocytes, UA: NEGATIVE
Nitrite, UA: NEGATIVE
POC,PROTEIN,UA: NEGATIVE
Spec Grav, UA: 1.02 (ref 1.010–1.025)
Urobilinogen, UA: 0.2 E.U./dL
pH, UA: 5 (ref 5.0–8.0)

## 2019-04-22 MED ORDER — TETANUS-DIPHTH-ACELL PERTUSSIS 5-2.5-18.5 LF-MCG/0.5 IM SUSP
0.5000 mL | Freq: Once | INTRAMUSCULAR | Status: AC
  Administered 2019-04-22: 11:00:00 0.5 mL via INTRAMUSCULAR

## 2019-04-22 NOTE — Patient Instructions (Signed)
Glucose Tolerance Test During Pregnancy Why am I having this test? The glucose tolerance test (GTT) is done to check how your body processes sugar (glucose). This is one of several tests used to diagnose diabetes that develops during pregnancy (gestational diabetes mellitus). Gestational diabetes is a temporary form of diabetes that some women develop during pregnancy. It usually occurs during the second trimester of pregnancy and goes away after delivery. Testing (screening) for gestational diabetes usually occurs between 24 and 28 weeks of pregnancy. You may have the GTT test after having a 1-hour glucose screening test if the results from that test indicate that you may have gestational diabetes. You may also have this test if:  You have a history of gestational diabetes.  You have a history of giving birth to very large babies or have experienced repeated fetal loss (stillbirth).  You have signs and symptoms of diabetes, such as: ? Changes in your vision. ? Tingling or numbness in your hands or feet. ? Changes in hunger, thirst, and urination that are not otherwise explained by your pregnancy. What is being tested? This test measures the amount of glucose in your blood at different times during a period of 3 hours. This indicates how well your body is able to process glucose. What kind of sample is taken?  Blood samples are required for this test. They are usually collected by inserting a needle into a blood vessel. How do I prepare for this test?  For 3 days before your test, eat normally. Have plenty of carbohydrate-rich foods.  Follow instructions from your health care provider about: ? Eating or drinking restrictions on the day of the test. You may be asked to not eat or drink anything other than water (fast) starting 8-10 hours before the test. ? Changing or stopping your regular medicines. Some medicines may interfere with this test. Tell a health care provider about:  All  medicines you are taking, including vitamins, herbs, eye drops, creams, and over-the-counter medicines.  Any blood disorders you have.  Any surgeries you have had.  Any medical conditions you have. What happens during the test? First, your blood glucose will be measured. This is referred to as your fasting blood glucose, since you fasted before the test. Then, you will drink a glucose solution that contains a certain amount of glucose. Your blood glucose will be measured again 1, 2, and 3 hours after drinking the solution. This test takes about 3 hours to complete. You will need to stay at the testing location during this time. During the testing period:  Do not eat or drink anything other than the glucose solution.  Do not exercise.  Do not use any products that contain nicotine or tobacco, such as cigarettes and e-cigarettes. If you need help stopping, ask your health care provider. The testing procedure may vary among health care providers and hospitals. How are the results reported? Your results will be reported as milligrams of glucose per deciliter of blood (mg/dL) or millimoles per liter (mmol/L). Your health care provider will compare your results to normal ranges that were established after testing a large group of people (reference ranges). Reference ranges may vary among labs and hospitals. For this test, common reference ranges are:  Fasting: less than 95-105 mg/dL (5.3-5.8 mmol/L).  1 hour after drinking glucose: less than 180-190 mg/dL (10.0-10.5 mmol/L).  2 hours after drinking glucose: less than 155-165 mg/dL (8.6-9.2 mmol/L).  3 hours after drinking glucose: 140-145 mg/dL (7.8-8.1 mmol/L). What do the   results mean? Results within reference ranges are considered normal, meaning that your glucose levels are well-controlled. If two or more of your blood glucose levels are high, you may be diagnosed with gestational diabetes. If only one level is high, your health care  provider may suggest repeat testing or other tests to confirm a diagnosis. Talk with your health care provider about what your results mean. Questions to ask your health care provider Ask your health care provider, or the department that is doing the test:  When will my results be ready?  How will I get my results?  What are my treatment options?  What other tests do I need?  What are my next steps? Summary  The glucose tolerance test (GTT) is one of several tests used to diagnose diabetes that develops during pregnancy (gestational diabetes mellitus). Gestational diabetes is a temporary form of diabetes that some women develop during pregnancy.  You may have the GTT test after having a 1-hour glucose screening test if the results from that test indicate that you may have gestational diabetes. You may also have this test if you have any symptoms or risk factors for gestational diabetes.  Talk with your health care provider about what your results mean. This information is not intended to replace advice given to you by your health care provider. Make sure you discuss any questions you have with your health care provider. Document Revised: 06/24/2018 Document Reviewed: 10/13/2016 Elsevier Patient Education  2020 Elsevier Inc.  

## 2019-04-22 NOTE — Progress Notes (Signed)
ROB doing well. Feels good movment. Has complains of pain in her belly button, Discussed possibility of developing hernia. No out pouching noticed on exam. Will continue to monitor. 28 wk labs today. TDAp/BTC/RPR/CBC/glucose. Pt request vaginal STD testing. She state she has concerns of recent exposure. Swab collected. Information given on [facility] options after baby, considering IUD, Sample birth plan given. Will discuss next visit.  Will follow up with results. ROB in 2 wks with Marcelino Duster, pt state she thinks she already has appoinment set with me, informed that is fine she can see Marcelino Duster the next visit.   Doreene Burke, CNM

## 2019-04-22 NOTE — Addendum Note (Signed)
Addended by: Mechele Claude on: 04/22/2019 01:42 PM   Modules accepted: Orders

## 2019-04-23 ENCOUNTER — Other Ambulatory Visit: Payer: Self-pay | Admitting: Certified Nurse Midwife

## 2019-04-23 LAB — CBC
Hematocrit: 32.3 % — ABNORMAL LOW (ref 34.0–46.6)
Hemoglobin: 10.4 g/dL — ABNORMAL LOW (ref 11.1–15.9)
MCH: 25.1 pg — ABNORMAL LOW (ref 26.6–33.0)
MCHC: 32.2 g/dL (ref 31.5–35.7)
MCV: 78 fL — ABNORMAL LOW (ref 79–97)
Platelets: 202 10*3/uL (ref 150–450)
RBC: 4.15 x10E6/uL (ref 3.77–5.28)
RDW: 13.8 % (ref 11.7–15.4)
WBC: 9.5 10*3/uL (ref 3.4–10.8)

## 2019-04-23 LAB — RPR: RPR Ser Ql: NONREACTIVE

## 2019-04-23 LAB — GLUCOSE, 1 HOUR GESTATIONAL: Gestational Diabetes Screen: 106 mg/dL (ref 65–139)

## 2019-04-23 MED ORDER — FUSION PLUS PO CAPS: 1.0000 | ORAL_CAPSULE | Freq: Every day | ORAL | 6 refills | Status: DC

## 2019-04-24 LAB — URINE CULTURE

## 2019-04-26 LAB — CERVICOVAGINAL ANCILLARY ONLY
Bacterial Vaginitis (gardnerella): NEGATIVE
Candida Glabrata: NEGATIVE
Candida Vaginitis: NEGATIVE
Chlamydia: NEGATIVE
Comment: NEGATIVE
Comment: NEGATIVE
Comment: NEGATIVE
Comment: NEGATIVE
Comment: NEGATIVE
Comment: NORMAL
Neisseria Gonorrhea: NEGATIVE
Trichomonas: NEGATIVE

## 2019-05-02 ENCOUNTER — Other Ambulatory Visit: Payer: Self-pay

## 2019-05-02 ENCOUNTER — Encounter: Payer: Self-pay | Admitting: Certified Nurse Midwife

## 2019-05-02 ENCOUNTER — Ambulatory Visit (INDEPENDENT_AMBULATORY_CARE_PROVIDER_SITE_OTHER): Payer: BC Managed Care – PPO | Admitting: Certified Nurse Midwife

## 2019-05-02 VITALS — BP 105/54 | HR 90 | Wt 182.4 lb

## 2019-05-02 DIAGNOSIS — Z3492 Encounter for supervision of normal pregnancy, unspecified, second trimester: Secondary | ICD-10-CM

## 2019-05-02 LAB — POCT URINALYSIS DIPSTICK OB
Bilirubin, UA: NEGATIVE
Blood, UA: NEGATIVE
Glucose, UA: NEGATIVE
Ketones, UA: NEGATIVE
Leukocytes, UA: NEGATIVE
Nitrite, UA: NEGATIVE
POC,PROTEIN,UA: NEGATIVE
Spec Grav, UA: 1.02 (ref 1.010–1.025)
Urobilinogen, UA: 0.2 E.U./dL
pH, UA: 5 (ref 5.0–8.0)

## 2019-05-02 NOTE — Lactation Note (Signed)
Lactation Consultation Note  Patient Name: [name] [name] Today's Date: 05/02/2019     Maternal Data    Feeding    LATCH Score                   Interventions    Lactation Tools Discussed/Used     Consult Status   Lactation student attempted to discuss breastfeeding education per Ready, Set, Baby Curriculum but patient declines and wishes to bottle feed.    Arlyss Gandy 05/02/2019, 4:45 PM

## 2019-05-02 NOTE — Progress Notes (Signed)
ROB doing well. Feels good movement. Discussed birth plan. She is interested in water birth, explained that it is not an option at this time due to COVID. Discussed use of shower, movement, position changes, birth ball, rocking chair to facilitate natural birth. Encouraged music and aromatherapy as well. She verbalizes understanding and agrees to plan. She is planning on bottle feeding. Declines RSB information. Plans IUD postpartum. Follow up 2 wks with Marcelino Duster.   Doreene Burke, CNM

## 2019-05-02 NOTE — Patient Instructions (Signed)
[place] [facility]ian List  [facility]  [address], [place], [place] [address]  Phone: [phone]  [facility] (second location)  [address] [place], [place] [address]  Phone: [phone]  [facility] ([place]) [address], [place], [place] [address] Phone: [phone]  [facility]  [address] [place], [place] [address]  Phone: [phone] 

## 2019-05-16 ENCOUNTER — Other Ambulatory Visit: Payer: Self-pay

## 2019-05-16 ENCOUNTER — Ambulatory Visit (INDEPENDENT_AMBULATORY_CARE_PROVIDER_SITE_OTHER): Payer: BC Managed Care – PPO | Admitting: Certified Nurse Midwife

## 2019-05-16 ENCOUNTER — Encounter: Payer: Self-pay | Admitting: Certified Nurse Midwife

## 2019-05-16 VITALS — BP 102/54 | HR 95 | Wt 184.2 lb

## 2019-05-16 DIAGNOSIS — Z3A33 33 weeks gestation of pregnancy: Secondary | ICD-10-CM

## 2019-05-16 DIAGNOSIS — Z3493 Encounter for supervision of normal pregnancy, unspecified, third trimester: Secondary | ICD-10-CM

## 2019-05-16 DIAGNOSIS — Z3492 Encounter for supervision of normal pregnancy, unspecified, second trimester: Secondary | ICD-10-CM

## 2019-05-16 LAB — POCT URINALYSIS DIPSTICK OB
Bilirubin, UA: NEGATIVE
Blood, UA: NEGATIVE
Glucose, UA: NEGATIVE
Ketones, UA: NEGATIVE
Leukocytes, UA: NEGATIVE
Nitrite, UA: NEGATIVE
POC,PROTEIN,UA: NEGATIVE
Spec Grav, UA: 1.01 (ref 1.010–1.025)
Urobilinogen, UA: 0.2 E.U./dL
pH, UA: 5 (ref 5.0–8.0)

## 2019-05-16 NOTE — Patient Instructions (Signed)
[facility] [facility]ian List  [facility]  [address], [place], [place] [address]  Phone: [phone]  [facility] (second location)  [address] [place], [place] [address]  Phone: [phone]  [facility] ([place]) [address], [place], [place] [address] Phone: [phone]  [facility]  [address] , [place] [address]  Phone: [phone] 

## 2019-05-16 NOTE — Progress Notes (Signed)
ROB doing well. Feels good movement. Discussed waterbirth coming back to [facility] , She states she is no longer interested in going forward with water birth . Feels like she will need an epidural or pain medications. Discussed GBS testing at 36 wks. Follow up in 2 wks with Marcelino Duster.   Doreene Burke, CNM

## 2019-05-30 ENCOUNTER — Ambulatory Visit (INDEPENDENT_AMBULATORY_CARE_PROVIDER_SITE_OTHER): Payer: BC Managed Care – PPO | Admitting: Certified Nurse Midwife

## 2019-05-30 ENCOUNTER — Other Ambulatory Visit: Payer: Self-pay

## 2019-05-30 VITALS — BP 117/61 | HR 89 | Wt 185.2 lb

## 2019-05-30 DIAGNOSIS — Z3492 Encounter for supervision of normal pregnancy, unspecified, second trimester: Secondary | ICD-10-CM

## 2019-05-30 NOTE — Patient Instructions (Signed)
Fetal Movement Counts Patient Name: ________________________________________________ Patient Due Date: ____________________ What is a fetal movement count?  A fetal movement count is the number of times that you feel your baby move during a certain amount of time. This may also be called a fetal kick count. A fetal movement count is recommended for every pregnant woman. You may be asked to start counting fetal movements as early as week 28 of your pregnancy. Pay attention to when your baby is most active. You may notice your baby's sleep and wake cycles. You may also notice things that make your baby move more. You should do a fetal movement count:  When your baby is normally most active.  At the same time each day. A good time to count movements is while you are resting, after having something to eat and drink. How do I count fetal movements? 1. Find a quiet, comfortable area. Sit, or lie down on your side. 2. Write down the date, the start time and stop time, and the number of movements that you felt between those two times. Take this information with you to your health care visits. 3. Write down your start time when you feel the first movement. 4. Count kicks, flutters, swishes, rolls, and jabs. You should feel at least 10 movements. 5. You may stop counting after you have felt 10 movements, or if you have been counting for 2 hours. Write down the stop time. 6. If you do not feel 10 movements in 2 hours, contact your health care provider for further instructions. Your health care provider may want to do additional tests to assess your baby's well-being. Contact a health care provider if:  You feel fewer than 10 movements in 2 hours.  Your baby is not moving like he or she usually does. Date: ____________ Start time: ____________ Stop time: ____________ Movements: ____________ Date: ____________ Start time: ____________ Stop time: ____________ Movements: ____________ Date: ____________  Start time: ____________ Stop time: ____________ Movements: ____________ Date: ____________ Start time: ____________ Stop time: ____________ Movements: ____________ Date: ____________ Start time: ____________ Stop time: ____________ Movements: ____________ Date: ____________ Start time: ____________ Stop time: ____________ Movements: ____________ Date: ____________ Start time: ____________ Stop time: ____________ Movements: ____________ Date: ____________ Start time: ____________ Stop time: ____________ Movements: ____________ Date: ____________ Start time: ____________ Stop time: ____________ Movements: ____________ This information is not intended to replace advice given to you by your health care provider. Make sure you discuss any questions you have with your health care provider. Document Revised: 10/21/2018 Document Reviewed: 10/21/2018 Elsevier Patient Education  2020 Elsevier Inc.  

## 2019-05-30 NOTE — Progress Notes (Signed)
ROB-Doing well, no questions or concerns. Plan epidural, no longer desires waterbirth. Anticipatory guidance regarding course of prenatal care. Reviewed red flag symptoms and when to call. RTC x 1 week for 36 week cultures and ROB or sooner if needed.

## 2019-05-31 ENCOUNTER — Encounter: Payer: BC Managed Care – PPO | Admitting: Certified Nurse Midwife

## 2019-06-03 NOTE — Patient Instructions (Addendum)
Third Trimester of Pregnancy  The third trimester is from week 28 through week 40 (months 7 through 9). This trimester is when your unborn baby (fetus) is growing very fast. At the end of the ninth month, the unborn baby is about 20 inches in length. It weighs about 6-10 pounds. Follow these instructions at home: Medicines  Take over-the-counter and prescription medicines only as told by your doctor. Some medicines are safe and some medicines are not safe during pregnancy.  Take a prenatal vitamin that contains at least 600 micrograms (mcg) of folic acid.  If you have trouble pooping (constipation), take medicine that will make your stool soft (stool softener) if your doctor approves. Eating and drinking   Eat regular, healthy meals.  Avoid raw meat and uncooked cheese.  If you get low calcium from the food you eat, talk to your doctor about taking a daily calcium supplement.  Eat four or five small meals rather than three large meals a day.  Avoid foods that are high in fat and sugars, such as fried and sweet foods.  To prevent constipation: ? Eat foods that are high in fiber, like fresh fruits and vegetables, whole grains, and beans. ? Drink enough fluids to keep your pee (urine) clear or pale yellow. Activity  Exercise only as told by your doctor. Stop exercising if you start to have cramps.  Avoid heavy lifting, wear low heels, and sit up straight.  Do not exercise if it is too hot, too humid, or if you are in a place of great height (high altitude).  You may continue to have sex unless your doctor tells you not to. Relieving pain and discomfort  Wear a good support bra if your breasts are tender.  Take frequent breaks and rest with your legs raised if you have leg cramps or low back pain.  Take warm water baths (sitz baths) to soothe pain or discomfort caused by hemorrhoids. Use hemorrhoid cream if your doctor approves.  If you develop puffy, bulging veins (varicose  veins) in your legs: ? Wear support hose or compression stockings as told by your doctor. ? Raise (elevate) your feet for 15 minutes, 3-4 times a day. ? Limit salt in your food. Safety  Wear your seat belt when driving.  Make a list of emergency phone numbers, including numbers for family, friends, the hospital, and police and fire departments. Preparing for your baby's arrival To prepare for the arrival of your baby:  Take prenatal classes.  Practice driving to the hospital.  Visit the hospital and tour the maternity area.  Talk to your work about taking leave once the baby comes.  Pack your hospital bag.  Prepare the baby's room.  Go to your doctor visits.  Buy a rear-facing car seat. Learn how to install it in your car. General instructions  Do not use hot tubs, steam rooms, or saunas.  Do not use any products that contain nicotine or tobacco, such as cigarettes and e-cigarettes. If you need help quitting, ask your doctor.  Do not drink alcohol.  Do not douche or use tampons or scented sanitary pads.  Do not cross your legs for long periods of time.  Do not travel for long distances unless you must. Only do so if your doctor says it is okay.  Visit your dentist if you have not gone during your pregnancy. Use a soft toothbrush to brush your teeth. Be gentle when you floss.  Avoid cat litter boxes and soil  used by cats. These carry germs that can cause birth defects in the baby and can cause a loss of your baby (miscarriage) or stillbirth.  Keep all your prenatal visits as told by your doctor. This is important. Contact a doctor if:  You are not sure if you are in labor or if your water has broken.  You are dizzy.  You have mild cramps or pressure in your lower belly.  You have a nagging pain in your belly area.  You continue to feel sick to your stomach, you throw up, or you have watery poop.  You have bad smelling fluid coming from your vagina.  You have  pain when you pee. Get help right away if:  You have a fever.  You are leaking fluid from your vagina.  You are spotting or bleeding from your vagina.  You have severe belly cramps or pain.  You lose or gain weight quickly.  You have trouble catching your breath and have chest pain.  You notice sudden or extreme puffiness (swelling) of your face, hands, ankles, feet, or legs.  You have not felt the baby move in over an hour.  You have severe headaches that do not go away with medicine.  You have trouble seeing.  You are leaking, or you are having a gush of fluid, from your vagina before you are 37 weeks.  You have regular belly spasms (contractions) before you are 37 weeks. Summary  The third trimester is from week 28 through week 40 (months 7 through 9). This time is when your unborn baby is growing very fast.  Follow your doctor's advice about medicine, food, and activity.  Get ready for the arrival of your baby by taking prenatal classes, getting all the baby items ready, preparing the baby's room, and visiting your doctor to be checked.  Get help right away if you are bleeding from your vagina, or you have chest pain and trouble catching your breath, or if you have not felt your baby move in over an hour. This information is not intended to replace advice given to you by your health care provider. Make sure you discuss any questions you have with your health care provider. Document Revised: 06/24/2018 Document Reviewed: 04/08/2016 Elsevier Patient Education  [place]. Common Medications Safe in Pregnancy  Acne:      Constipation:  Benzoyl Peroxide     Colace  Clindamycin      Dulcolax Suppository  Topica Erythromycin     Fibercon  Salicylic Acid      Metamucil         Miralax AVOID:        Senakot   Accutane    Cough:  Retin-A       Cough Drops  Tetracycline      Phenergan w/ Codeine if Rx  Minocycline      Robitussin (Plain &  DM)  Antibiotics:     Crabs/Lice:  Ceclor       RID  Cephalosporins    AVOID:  E-Mycins      Kwell  Keflex  Macrobid/Macrodantin   Diarrhea:  Penicillin      Kao-Pectate  Zithromax      Imodium AD         PUSH FLUIDS AVOID:       Cipro     Fever:  Tetracycline      Tylenol (Regular or Extra  Minocycline       Strength)  Levaquin  Extra Strength-Do not          Exceed 8 tabs/24 hrs Caffeine:        <200mg/day (equiv. To 1 cup of coffee or  approx. 3 12 oz sodas)         Gas: Cold/Hayfever:       Gas-X  Benadryl      Mylicon  Claritin       Phazyme  **Claritin-D        Chlor-Trimeton    Headaches:  Dimetapp      ASA-Free Excedrin  Drixoral-Non-Drowsy     Cold Compress  Mucinex (Guaifenasin)     Tylenol (Regular or Extra  Sudafed/Sudafed-12 Hour     Strength)  **Sudafed PE Pseudoephedrine   Tylenol Cold & Sinus     Vicks Vapor Rub  Zyrtec  **AVOID if Problems With Blood Pressure         Heartburn: Avoid lying down for at least 1 hour after meals  Aciphex      Maalox     Rash:  Milk of Magnesia     Benadryl    Mylanta       1% Hydrocortisone Cream  Pepcid  Pepcid Complete   Sleep Aids:  Prevacid      Ambien   Prilosec       Benadryl  Rolaids       Chamomile Tea  Tums (Limit 4/day)     Unisom  Zantac       Tylenol PM         Warm milk-add vanilla or  Hemorrhoids:       Sugar for taste  Anusol/Anusol H.C.  (RX: Analapram 2.5%)  Sugar Substitutes:  Hydrocortisone OTC     Ok in moderation  Preparation H      Tucks        Vaseline lotion applied to tissue with wiping    Herpes:     Throat:  Acyclovir      Oragel  Famvir  Valtrex     Vaccines:         Flu Shot Leg Cramps:       *Gardasil  Benadryl      Hepatitis A         Hepatitis B Nasal Spray:       Pneumovax  Saline Nasal Spray     Polio Booster         Tetanus Nausea:       Tuberculosis test or PPD  Vitamin B6 25 mg TID   AVOID:    Dramamine      *Gardasil  Emetrol       Live  Poliovirus  Ginger Root 250 mg QID    MMR (measles, mumps &  High Complex Carbs @ Bedtime    rebella)  Sea Bands-Accupressure    Varicella (Chickenpox)  Unisom 1/2 tab TID     *No known complications           If received before Pain:         Known pregnancy;   Darvocet       Resume series after  Lortab        Delivery  Percocet    Yeast:   Tramadol      Femstat  Tylenol 3      Gyne-lotrimin  Ultram       Monistat  Vicodin           MISC:           All Sunscreens           Hair Coloring/highlights          Insect Repellant's          (Including DEET)         Mystic Tans Breastfeeding  Choosing to breastfeed is one of the best decisions you can make for yourself and your baby. A change in hormones during pregnancy causes your breasts to make breast milk in your milk-producing glands. Hormones prevent breast milk from being released before your baby is born. They also prompt milk flow after birth. Once breastfeeding has begun, thoughts of your baby, as well as his or her sucking or crying, can stimulate the release of milk from your milk-producing glands. Benefits of breastfeeding Research shows that breastfeeding offers many health benefits for infants and mothers. It also offers a cost-free and convenient way to feed your baby. For your baby  Your first milk (colostrum) helps your baby's digestive system to function better.  Special cells in your milk (antibodies) help your baby to fight off infections.  Breastfed babies are less likely to develop asthma, allergies, obesity, or type 2 diabetes. They are also at lower risk for sudden infant death syndrome (SIDS).  Nutrients in breast milk are better able to meet your baby's needs compared to infant formula.  Breast milk improves your baby's brain development. For you  Breastfeeding helps to create a very special bond between you and your baby.  Breastfeeding is convenient. Breast milk costs nothing and is always available at the  correct temperature.  Breastfeeding helps to burn calories. It helps you to lose the weight that you gained during pregnancy.  Breastfeeding makes your uterus return faster to its size before pregnancy. It also slows bleeding (lochia) after you give birth.  Breastfeeding helps to lower your risk of developing type 2 diabetes, osteoporosis, rheumatoid arthritis, cardiovascular disease, and breast, ovarian, uterine, and endometrial cancer later in life. Breastfeeding basics Starting breastfeeding  Find a comfortable place to sit or lie down, with your neck and back well-supported.  Place a pillow or a rolled-up blanket under your baby to bring him or her to the level of your breast (if you are seated). Nursing pillows are specially designed to help support your arms and your baby while you breastfeed.  Make sure that your baby's tummy (abdomen) is facing your abdomen.  Gently massage your breast. With your fingertips, massage from the outer edges of your breast inward toward the nipple. This encourages milk flow. If your milk flows slowly, you may need to continue this action during the feeding.  Support your breast with 4 fingers underneath and your thumb above your nipple (make the letter "C" with your hand). Make sure your fingers are well away from your nipple and your baby's mouth.  Stroke your baby's lips gently with your finger or nipple.  When your baby's mouth is open wide enough, quickly bring your baby to your breast, placing your entire nipple and as much of the areola as possible into your baby's mouth. The areola is the colored area around your nipple. ? More areola should be visible above your baby's upper lip than below the lower lip. ? Your baby's lips should be opened and extended outward (flanged) to ensure an adequate, comfortable latch. ? Your baby's tongue should be between his or her lower gum and your breast.  Make sure that your baby's mouth is correctly positioned  around your nipple (latched). Your  baby's lips should create a seal on your breast and be turned out (everted).  It is common for your baby to suck about 2-3 minutes in order to start the flow of breast milk. Latching Teaching your baby how to latch onto your breast properly is very important. An improper latch can cause nipple pain, decreased milk supply, and poor weight gain in your baby. Also, if your baby is not latched onto your nipple properly, he or she may swallow some air during feeding. This can make your baby fussy. Burping your baby when you switch breasts during the feeding can help to get rid of the air. However, teaching your baby to latch on properly is still the best way to prevent fussiness from swallowing air while breastfeeding. Signs that your baby has successfully latched onto your nipple  Silent tugging or silent sucking, without causing you pain. Infant's lips should be extended outward (flanged).  Swallowing heard between every 3-4 sucks once your milk has started to flow (after your let-down milk reflex occurs).  Muscle movement above and in front of his or her ears while sucking. Signs that your baby has not successfully latched onto your nipple  Sucking sounds or smacking sounds from your baby while breastfeeding.  Nipple pain. If you think your baby has not latched on correctly, slip your finger into the corner of your baby's mouth to break the suction and place it between your baby's gums. Attempt to start breastfeeding again. Signs of successful breastfeeding Signs from your baby  Your baby will gradually decrease the number of sucks or will completely stop sucking.  Your baby will fall asleep.  Your baby's body will relax.  Your baby will retain a small amount of milk in his or her mouth.  Your baby will let go of your breast by himself or herself. Signs from you  Breasts that have increased in firmness, weight, and size 1-3 hours after  feeding.  Breasts that are softer immediately after breastfeeding.  Increased milk volume, as well as a change in milk consistency and color by the fifth day of breastfeeding.  Nipples that are not sore, cracked, or bleeding. Signs that your baby is getting enough milk  Wetting at least 1-2 diapers during the first 24 hours after birth.  Wetting at least 5-6 diapers every 24 hours for the first week after birth. The urine should be clear or pale yellow by the age of 5 days.  Wetting 6-8 diapers every 24 hours as your baby continues to grow and develop.  At least 3 stools in a 24-hour period by the age of 5 days. The stool should be soft and yellow.  At least 3 stools in a 24-hour period by the age of 7 days. The stool should be seedy and yellow.  No loss of weight greater than 10% of birth weight during the first 3 days of life.  Average weight gain of 4-7 oz (113-198 g) per week after the age of 4 days.  Consistent daily weight gain by the age of 5 days, without weight loss after the age of 2 weeks. After a feeding, your baby may spit up a small amount of milk. This is normal. Breastfeeding frequency and duration Frequent feeding will help you make more milk and can prevent sore nipples and extremely full breasts (breast engorgement). Breastfeed when you feel the need to reduce the fullness of your breasts or when your baby shows signs of hunger. This is called "breastfeeding on  demand." Signs that your baby is hungry include:  Increased alertness, activity, or restlessness.  Movement of the head from side to side.  Opening of the mouth when the corner of the mouth or cheek is stroked (rooting).  Increased sucking sounds, smacking lips, cooing, sighing, or squeaking.  Hand-to-mouth movements and sucking on fingers or hands.  Fussing or crying. Avoid introducing a pacifier to your baby in the first 4-6 weeks after your baby is born. After this time, you may choose to use a  pacifier. Research has shown that pacifier use during the first year of a baby's life decreases the risk of sudden infant death syndrome (SIDS). Allow your baby to feed on each breast as long as he or she wants. When your baby unlatches or falls asleep while feeding from the first breast, offer the second breast. Because newborns are often sleepy in the first few weeks of life, you may need to awaken your baby to get him or her to feed. Breastfeeding times will vary from baby to baby. However, the following rules can serve as a guide to help you make sure that your baby is properly fed:  Newborns (babies 4 weeks of age or younger) may breastfeed every 1-3 hours.  Newborns should not go without breastfeeding for longer than 3 hours during the day or 5 hours during the night.  You should breastfeed your baby a minimum of 8 times in a 24-hour period. Breast milk pumping     Pumping and storing breast milk allows you to make sure that your baby is exclusively fed your breast milk, even at times when you are unable to breastfeed. This is especially important if you go back to work while you are still breastfeeding, or if you are not able to be present during feedings. Your lactation consultant can help you find a method of pumping that works best for you and give you guidelines about how long it is safe to store breast milk. Caring for your breasts while you breastfeed Nipples can become dry, cracked, and sore while breastfeeding. The following recommendations can help keep your breasts moisturized and healthy:  Avoid using soap on your nipples.  Wear a supportive bra designed especially for nursing. Avoid wearing underwire-style bras or extremely tight bras (sports bras).  Air-dry your nipples for 3-4 minutes after each feeding.  Use only cotton bra pads to absorb leaked breast milk. Leaking of breast milk between feedings is normal.  Use lanolin on your nipples after breastfeeding. Lanolin  helps to maintain your skin's normal moisture barrier. Pure lanolin is not harmful (not toxic) to your baby. You may also hand express a few drops of breast milk and gently massage that milk into your nipples and allow the milk to air-dry. In the first few weeks after giving birth, some women experience breast engorgement. Engorgement can make your breasts feel heavy, warm, and tender to the touch. Engorgement peaks within 3-5 days after you give birth. The following recommendations can help to ease engorgement:  Completely empty your breasts while breastfeeding or pumping. You may want to start by applying warm, moist heat (in the shower or with warm, water-soaked hand towels) just before feeding or pumping. This increases circulation and helps the milk flow. If your baby does not completely empty your breasts while breastfeeding, pump any extra milk after he or she is finished.  Apply ice packs to your breasts immediately after breastfeeding or pumping, unless this is too uncomfortable for you. To   do this: ? Put ice in a plastic bag. ? Place a towel between your skin and the bag. ? Leave the ice on for 20 minutes, 2-3 times a day.  Make sure that your baby is latched on and positioned properly while breastfeeding. If engorgement persists after 48 hours of following these recommendations, contact your health care provider or a Science writer. Overall health care recommendations while breastfeeding  Eat 3 healthy meals and 3 snacks every day. Well-nourished mothers who are breastfeeding need an additional 450-500 calories a day. You can meet this requirement by increasing the amount of a balanced diet that you eat.  Drink enough water to keep your urine pale yellow or clear.  Rest often, relax, and continue to take your prenatal vitamins to prevent fatigue, stress, and low vitamin and mineral levels in your body (nutrient deficiencies).  Do not use any products that contain nicotine or  tobacco, such as cigarettes and e-cigarettes. Your baby may be harmed by chemicals from cigarettes that pass into breast milk and exposure to secondhand smoke. If you need help quitting, ask your health care provider.  Avoid alcohol.  Do not use illegal drugs or marijuana.  Talk with your health care provider before taking any medicines. These include over-the-counter and prescription medicines as well as vitamins and herbal supplements. Some medicines that may be harmful to your baby can pass through breast milk.  It is possible to become pregnant while breastfeeding. If birth control is desired, ask your health care provider about options that will be safe while breastfeeding your baby. Where to find more information: [employer]: www.llli.org Contact a health care provider if:  You feel like you want to stop breastfeeding or have become frustrated with breastfeeding.  Your nipples are cracked or bleeding.  Your breasts are red, tender, or warm.  You have: ? Painful breasts or nipples. ? A swollen area on either breast. ? A fever or chills. ? Nausea or vomiting. ? Drainage other than breast milk from your nipples.  Your breasts do not become full before feedings by the fifth day after you give birth.  You feel sad and depressed.  Your baby is: ? Too sleepy to eat well. ? Having trouble sleeping. ? More than 3 week old and wetting fewer than 6 diapers in a 24-hour period. ? Not gaining weight by 61 days of age.  Your baby has fewer than 3 stools in a 24-hour period.  Your baby's skin or the white parts of his or her eyes become yellow. Get help right away if:  Your baby is overly tired (lethargic) and does not want to wake up and feed.  Your baby develops an unexplained fever. Summary  Breastfeeding offers many health benefits for infant and mothers.  Try to breastfeed your infant when he or she shows early signs of hunger.  Gently tickle or stroke  your baby's lips with your finger or nipple to allow the baby to open his or her mouth. Bring the baby to your breast. Make sure that much of the areola is in your baby's mouth. Offer one side and burp the baby before you offer the other side.  Talk with your health care provider or lactation consultant if you have questions or you face problems as you breastfeed. This information is not intended to replace advice given to you by your health care provider. Make sure you discuss any questions you have with your health care provider. Document Revised: 05/28/2017  Document Reviewed: 04/04/2016 Elsevier Patient Education  2020 [employer]  Fetal Movement Counts Patient Name: ________________________________________________ Patient Due Date: ____________________ What is a fetal movement count?  A fetal movement count is the number of times that you feel your baby move during a certain amount of time. This may also be called a fetal kick count. A fetal movement count is recommended for every pregnant woman. You may be asked to start counting fetal movements as early as week 28 of your pregnancy. Pay attention to when your baby is most active. You may notice your baby's sleep and wake cycles. You may also notice things that make your baby move more. You should do a fetal movement count:  When your baby is normally most active.  At the same time each day. A good time to count movements is while you are resting, after having something to eat and drink. How do I count fetal movements? 1. Find a quiet, comfortable area. Sit, or lie down on your side. 2. Write down the date, the start time and stop time, and the number of movements that you felt between those two times. Take this information with you to your health care visits. 3. Write down your start time when you feel the first movement. 4. Count kicks, flutters, swishes, rolls, and jabs. You should feel at least 10 movements. 5. You may stop  counting after you have felt 10 movements, or if you have been counting for 2 hours. Write down the stop time. 6. If you do not feel 10 movements in 2 hours, contact your health care provider for further instructions. Your health care provider may want to do additional tests to assess your baby's well-being. Contact a health care provider if:  You feel fewer than 10 movements in 2 hours.  Your baby is not moving like he or she usually does. Date: ____________ Start time: ____________ Stop time: ____________ Movements: ____________ Date: ____________ Start time: ____________ Stop time: ____________ Movements: ____________ Date: ____________ Start time: ____________ Stop time: ____________ Movements: ____________ Date: ____________ Start time: ____________ Stop time: ____________ Movements: ____________ Date: ____________ Start time: ____________ Stop time: ____________ Movements: ____________ Date: ____________ Start time: ____________ Stop time: ____________ Movements: ____________ Date: ____________ Start time: ____________ Stop time: ____________ Movements: ____________ Date: ____________ Start time: ____________ Stop time: ____________ Movements: ____________ Date: ____________ Start time: ____________ Stop time: ____________ Movements: ____________ This information is not intended to replace advice given to you by your health care provider. Make sure you discuss any questions you have with your health care provider. Document Revised: 10/21/2018 Document Reviewed: 10/21/2018 Elsevier Patient Education  2020 Elsevier Inc.  

## 2019-06-06 ENCOUNTER — Encounter: Payer: Self-pay | Admitting: Certified Nurse Midwife

## 2019-06-06 ENCOUNTER — Ambulatory Visit (INDEPENDENT_AMBULATORY_CARE_PROVIDER_SITE_OTHER): Payer: BC Managed Care – PPO | Admitting: Certified Nurse Midwife

## 2019-06-06 ENCOUNTER — Other Ambulatory Visit: Payer: Self-pay

## 2019-06-06 VITALS — BP 106/68 | HR 119 | Wt 187.3 lb

## 2019-06-06 DIAGNOSIS — Z3493 Encounter for supervision of normal pregnancy, unspecified, third trimester: Secondary | ICD-10-CM

## 2019-06-06 DIAGNOSIS — Z3A36 36 weeks gestation of pregnancy: Secondary | ICD-10-CM

## 2019-06-06 LAB — POCT URINALYSIS DIPSTICK OB
Bilirubin, UA: NEGATIVE
Blood, UA: NEGATIVE
Glucose, UA: NEGATIVE
Ketones, UA: NEGATIVE
Nitrite, UA: NEGATIVE
Spec Grav, UA: 1.02 (ref 1.010–1.025)
Urobilinogen, UA: 0.2 E.U./dL
pH, UA: 6.5 (ref 5.0–8.0)

## 2019-06-06 LAB — OB RESULTS CONSOLE GC/CHLAMYDIA: Gonorrhea: NEGATIVE

## 2019-06-06 NOTE — Progress Notes (Signed)
ROB-Pt present for routine prenatal care and 36 week cultures. Pt c/o lower back and abd area pain.

## 2019-06-06 NOTE — Progress Notes (Signed)
ROB-Reports intermittent lower back pain and pressure. Discussed home treatment measures. 36 week cultures collected, see orders. Request SVE. Anticipatory guidance regarding course of prenatal care. Reviewed red flag symptoms and when to call. RTC x 1 week for ROB or sooner if needed.

## 2019-06-08 LAB — GC/CHLAMYDIA PROBE AMP
Chlamydia trachomatis, NAA: NEGATIVE
Neisseria Gonorrhoeae by PCR: NEGATIVE

## 2019-06-08 LAB — STREP GP B NAA: Strep Gp B NAA: POSITIVE — AB

## 2019-06-09 ENCOUNTER — Encounter: Payer: Self-pay | Admitting: Certified Nurse Midwife

## 2019-06-14 ENCOUNTER — Telehealth: Payer: Self-pay | Admitting: Certified Nurse Midwife

## 2019-06-14 DIAGNOSIS — M7989 Other specified soft tissue disorders: Secondary | ICD-10-CM

## 2019-06-14 DIAGNOSIS — Z3A37 37 weeks gestation of pregnancy: Secondary | ICD-10-CM

## 2019-06-14 DIAGNOSIS — Z3493 Encounter for supervision of normal pregnancy, unspecified, third trimester: Secondary | ICD-10-CM

## 2019-06-14 NOTE — Telephone Encounter (Signed)
Patient called in saying her legs were swellling and it was going into her feet. Patient says this only happens at the end of the day. Patient says that its starting to cause her pain- she has an upcoming appointment but patient would like a call back to discuss options on what to do in the meantime. 

## 2019-06-15 ENCOUNTER — Telehealth: Payer: Self-pay | Admitting: Certified Nurse Midwife

## 2019-06-15 ENCOUNTER — Encounter: Payer: BC Managed Care – PPO | Admitting: Certified Nurse Midwife

## 2019-06-15 ENCOUNTER — Telehealth: Payer: Self-pay

## 2019-06-15 NOTE — Telephone Encounter (Signed)
1154 06/15/2019 Telephone call to patient. HIPPA approved voicemail left on identified line instructing patient to call back with any questions or concerns.    [name], CNM [facility], [facility] 06/15/19 11:55 AM

## 2019-06-15 NOTE — Telephone Encounter (Signed)
Patient called in saying her legs were swellling and it was going into her feet. Patient says this only happens at the end of the day. Patient says that its starting to cause her pain- she has an upcoming appointment but patient would like a call back to discuss options on what to do in the meantime.

## 2019-06-15 NOTE — Telephone Encounter (Signed)
mychart message sent to patient

## 2019-06-16 ENCOUNTER — Ambulatory Visit (INDEPENDENT_AMBULATORY_CARE_PROVIDER_SITE_OTHER): Payer: BC Managed Care – PPO | Admitting: Certified Nurse Midwife

## 2019-06-16 ENCOUNTER — Other Ambulatory Visit: Payer: Self-pay

## 2019-06-16 VITALS — BP 130/74 | HR 85 | Wt 188.5 lb

## 2019-06-16 DIAGNOSIS — Z3493 Encounter for supervision of normal pregnancy, unspecified, third trimester: Secondary | ICD-10-CM

## 2019-06-16 DIAGNOSIS — Z3A37 37 weeks gestation of pregnancy: Secondary | ICD-10-CM

## 2019-06-16 LAB — POCT URINALYSIS DIPSTICK OB
Bilirubin, UA: NEGATIVE
Blood, UA: NEGATIVE
Glucose, UA: NEGATIVE
Ketones, UA: NEGATIVE
Leukocytes, UA: NEGATIVE
Nitrite, UA: NEGATIVE
POC,PROTEIN,UA: NEGATIVE
Spec Grav, UA: 1.01 (ref 1.010–1.025)
Urobilinogen, UA: 0.2 E.U./dL
pH, UA: 5 (ref 5.0–8.0)

## 2019-06-16 NOTE — Patient Instructions (Signed)
Fetal Movement Counts Patient Name: ________________________________________________ Patient Due Date: ____________________ What is a fetal movement count?  A fetal movement count is the number of times that you feel your baby move during a certain amount of time. This may also be called a fetal kick count. A fetal movement count is recommended for every pregnant woman. You may be asked to start counting fetal movements as early as week 28 of your pregnancy. Pay attention to when your baby is most active. You may notice your baby's sleep and wake cycles. You may also notice things that make your baby move more. You should do a fetal movement count:  When your baby is normally most active.  At the same time each day. A good time to count movements is while you are resting, after having something to eat and drink. How do I count fetal movements? 1. Find a quiet, comfortable area. Sit, or lie down on your side. 2. Write down the date, the start time and stop time, and the number of movements that you felt between those two times. Take this information with you to your health care visits. 3. Write down your start time when you feel the first movement. 4. Count kicks, flutters, swishes, rolls, and jabs. You should feel at least 10 movements. 5. You may stop counting after you have felt 10 movements, or if you have been counting for 2 hours. Write down the stop time. 6. If you do not feel 10 movements in 2 hours, contact your health care provider for further instructions. Your health care provider may want to do additional tests to assess your baby's well-being. Contact a health care provider if:  You feel fewer than 10 movements in 2 hours.  Your baby is not moving like he or she usually does. Date: ____________ Start time: ____________ Stop time: ____________ Movements: ____________ Date: ____________ Start time: ____________ Stop time: ____________ Movements: ____________ Date: ____________  Start time: ____________ Stop time: ____________ Movements: ____________ Date: ____________ Start time: ____________ Stop time: ____________ Movements: ____________ Date: ____________ Start time: ____________ Stop time: ____________ Movements: ____________ Date: ____________ Start time: ____________ Stop time: ____________ Movements: ____________ Date: ____________ Start time: ____________ Stop time: ____________ Movements: ____________ Date: ____________ Start time: ____________ Stop time: ____________ Movements: ____________ Date: ____________ Start time: ____________ Stop time: ____________ Movements: ____________ This information is not intended to replace advice given to you by your health care provider. Make sure you discuss any questions you have with your health care provider. Document Revised: 10/21/2018 Document Reviewed: 10/21/2018 Elsevier Patient Education  2020 Elsevier Inc.  

## 2019-06-16 NOTE — Progress Notes (Signed)
ROB-Doing well, notes swelling after working all day. Discussed home treatment measures. Reviewed red flag symptoms and when to call. RTC x 1 week for ROB or sooner if needed.

## 2019-06-21 ENCOUNTER — Observation Stay
Admission: EM | Admit: 2019-06-21 | Discharge: 2019-06-21 | Disposition: A | Discharge status: Home / Self Care | Payer: BC Managed Care – PPO | Attending: Obstetrics and Gynecology | Admitting: Obstetrics and Gynecology

## 2019-06-21 ENCOUNTER — Encounter: Payer: Self-pay | Admitting: Obstetrics and Gynecology

## 2019-06-21 ENCOUNTER — Other Ambulatory Visit: Payer: Self-pay

## 2019-06-21 DIAGNOSIS — O99820 Streptococcus B carrier state complicating pregnancy: Secondary | ICD-10-CM | POA: Diagnosis not present

## 2019-06-21 DIAGNOSIS — M5489 Other dorsalgia: Secondary | ICD-10-CM

## 2019-06-21 DIAGNOSIS — B951 Streptococcus, group B, as the cause of diseases classified elsewhere: Secondary | ICD-10-CM | POA: Diagnosis present

## 2019-06-21 DIAGNOSIS — R102 Pelvic and perineal pain: Secondary | ICD-10-CM

## 2019-06-21 DIAGNOSIS — Z3A38 38 weeks gestation of pregnancy: Secondary | ICD-10-CM | POA: Diagnosis not present

## 2019-06-21 DIAGNOSIS — O99891 Other specified diseases and conditions complicating pregnancy: Secondary | ICD-10-CM | POA: Diagnosis not present

## 2019-06-21 DIAGNOSIS — Z3490 Encounter for supervision of normal pregnancy, unspecified, unspecified trimester: Secondary | ICD-10-CM

## 2019-06-21 DIAGNOSIS — M549 Dorsalgia, unspecified: Secondary | ICD-10-CM

## 2019-06-21 LAB — URINALYSIS, ROUTINE W REFLEX MICROSCOPIC
Bilirubin Urine: NEGATIVE
Glucose, UA: NEGATIVE mg/dL
Hgb urine dipstick: NEGATIVE
Ketones, ur: NEGATIVE mg/dL
Nitrite: NEGATIVE
Protein, ur: NEGATIVE mg/dL
Specific Gravity, Urine: 1.02 (ref 1.005–1.030)
pH: 6 (ref 5.0–8.0)

## 2019-06-21 MED ORDER — LIDOCAINE 5 % EX PTCH
1.0000 | MEDICATED_PATCH | CUTANEOUS | Status: DC
  Administered 2019-06-21: 1 via TRANSDERMAL
  Filled 2019-06-21: qty 1

## 2019-06-21 NOTE — OB Triage Note (Signed)
Pt present with back pain and vaginal pressure 23 yo G3P2 at 38.1w [facility] 07/04/2019 Pt feels fetal movement. No loss of fluid or vag bleeding. No acute distress. GBS +

## 2019-06-21 NOTE — Discharge Instructions (Signed)

## 2019-06-22 DIAGNOSIS — O99891 Other specified diseases and conditions complicating pregnancy: Secondary | ICD-10-CM

## 2019-06-22 DIAGNOSIS — M549 Dorsalgia, unspecified: Secondary | ICD-10-CM

## 2019-06-22 NOTE — Discharge Summary (Signed)
Physician Obstetric Discharge Summary  Patient ID: [name] [name] MRN: [id number] DOB/AGE: 1996/02/02 23 y.o.   Date of Admission: 06/21/2019  Date of Discharge: 06/21/2019  Admitting Diagnosis: Observation at [redacted]w[redacted]d  Secondary Diagnosis: GBS positive  Discharge Diagnosis: No other diagnosis   Antepartum Procedures: NST   Brief Hospital Course   L&D OB Triage Note  [name] [name] is a 24 y.o. G66P2002 female at [redacted]w[redacted]d, EDD Estimated Date of Delivery: 07/04/19 who presented to triage for complaints of back pain and vaginal pressure for the last 24 hours.  She was evaluated by the nurses with no significant findings for active labor or fetal distress. Vital signs stable. An NST was performed and has been reviewed by CNM. She was treated with a lidocaine patch and given the [employer] to complete.   NST INTERPRETATION:  Indications: rule out uterine contractions  Mode: External Baseline Rate (A): 145 bpm Variability: Moderate Accelerations: 15 x 15 Decelerations: None Contraction Frequency (min): irregular  Impression: reactive  Dilation: 1 Effacement (%): 50 Cervical Position: Middle Station: -2 Presentation: Vertex Exam by:: JDaley   Plan: NST performed was reviewed and was found to be reactive. She was discharged home with bleeding/labor precautions.  Continue routine prenatal care. Follow up with CNM as previously scheduled.   Discharge Instructions: Per After Visit Summary.  Activity:  Also refer to After Visit Summary.  Diet: Regular  Medications: Allergies as of 06/21/2019      Reactions   Ibuprofen Nausea Only, Other (See Comments)   ibuprofen 800 mg - Vomiting and Shaking   Naproxen Itching, Nausea Only      Medication List    ASK your doctor about these medications   Fusion Plus Caps Take 1 tablet by mouth daily.   nitrofurantoin (macrocrystal-monohydrate) 100 MG capsule Commonly known as: MACROBID Take 1 capsule (100 mg total) by mouth 2 (two) times  daily.   prenatal multivitamin Tabs tablet Take 1 tablet by mouth daily at 12 noon.   ProAir HFA 108 (90 Base) MCG/ACT inhaler Generic drug: albuterol Inhale into the lungs as needed.      Postpartum contraception: IUD  Discharged Condition: stable  Discharged to: home   [name], CNM [facility], [facility] 06/22/19 5:35 AM

## 2019-06-24 ENCOUNTER — Other Ambulatory Visit: Payer: Self-pay

## 2019-06-24 ENCOUNTER — Ambulatory Visit (INDEPENDENT_AMBULATORY_CARE_PROVIDER_SITE_OTHER): Payer: BC Managed Care – PPO | Admitting: Certified Nurse Midwife

## 2019-06-24 VITALS — BP 107/66 | HR 88 | Wt 189.5 lb

## 2019-06-24 DIAGNOSIS — Z3493 Encounter for supervision of normal pregnancy, unspecified, third trimester: Secondary | ICD-10-CM

## 2019-06-24 DIAGNOSIS — Z3A38 38 weeks gestation of pregnancy: Secondary | ICD-10-CM

## 2019-06-24 NOTE — Patient Instructions (Signed)
Fetal Movement Counts Patient Name: ________________________________________________ Patient Due Date: ____________________ What is a fetal movement count?  A fetal movement count is the number of times that you feel your baby move during a certain amount of time. This may also be called a fetal kick count. A fetal movement count is recommended for every pregnant woman. You may be asked to start counting fetal movements as early as week 28 of your pregnancy. Pay attention to when your baby is most active. You may notice your baby's sleep and wake cycles. You may also notice things that make your baby move more. You should do a fetal movement count:  When your baby is normally most active.  At the same time each day. A good time to count movements is while you are resting, after having something to eat and drink. How do I count fetal movements? 1. Find a quiet, comfortable area. Sit, or lie down on your side. 2. Write down the date, the start time and stop time, and the number of movements that you felt between those two times. Take this information with you to your health care visits. 3. Write down your start time when you feel the first movement. 4. Count kicks, flutters, swishes, rolls, and jabs. You should feel at least 10 movements. 5. You may stop counting after you have felt 10 movements, or if you have been counting for 2 hours. Write down the stop time. 6. If you do not feel 10 movements in 2 hours, contact your health care provider for further instructions. Your health care provider may want to do additional tests to assess your baby's well-being. Contact a health care provider if:  You feel fewer than 10 movements in 2 hours.  Your baby is not moving like he or she usually does. Date: ____________ Start time: ____________ Stop time: ____________ Movements: ____________ Date: ____________ Start time: ____________ Stop time: ____________ Movements: ____________ Date: ____________  Start time: ____________ Stop time: ____________ Movements: ____________ Date: ____________ Start time: ____________ Stop time: ____________ Movements: ____________ Date: ____________ Start time: ____________ Stop time: ____________ Movements: ____________ Date: ____________ Start time: ____________ Stop time: ____________ Movements: ____________ Date: ____________ Start time: ____________ Stop time: ____________ Movements: ____________ Date: ____________ Start time: ____________ Stop time: ____________ Movements: ____________ Date: ____________ Start time: ____________ Stop time: ____________ Movements: ____________ This information is not intended to replace advice given to you by your health care provider. Make sure you discuss any questions you have with your health care provider. Document Revised: 10/21/2018 Document Reviewed: 10/21/2018 Elsevier Patient Education  2020 Elsevier Inc.  

## 2019-06-24 NOTE — Progress Notes (Signed)
ROB-Doing well, ready to have baby. Request SVE, unchanged since triage visit. Anticipatory guidance regarding course of prenatal care. Reviewed red flag symptoms and when to call. RTC x 1 week for ROB or sooner if needed.

## 2019-06-29 ENCOUNTER — Inpatient Hospital Stay
Admission: EM | Admit: 2019-06-29 | Discharge: 2019-07-01 | DRG: 807 | Disposition: A | Discharge status: Home / Self Care | Payer: BC Managed Care – PPO | Attending: Certified Nurse Midwife | Admitting: Certified Nurse Midwife

## 2019-06-29 ENCOUNTER — Telehealth: Payer: Self-pay | Admitting: Certified Nurse Midwife

## 2019-06-29 ENCOUNTER — Other Ambulatory Visit: Payer: Self-pay

## 2019-06-29 ENCOUNTER — Telehealth: Payer: Self-pay

## 2019-06-29 ENCOUNTER — Encounter: Payer: Self-pay | Admitting: Certified Nurse Midwife

## 2019-06-29 DIAGNOSIS — D649 Anemia, unspecified: Secondary | ICD-10-CM | POA: Diagnosis not present

## 2019-06-29 DIAGNOSIS — Z20822 Contact with and (suspected) exposure to covid-19: Secondary | ICD-10-CM | POA: Diagnosis present

## 2019-06-29 DIAGNOSIS — B951 Streptococcus, group B, as the cause of diseases classified elsewhere: Secondary | ICD-10-CM | POA: Diagnosis present

## 2019-06-29 DIAGNOSIS — F1290 Cannabis use, unspecified, uncomplicated: Secondary | ICD-10-CM | POA: Diagnosis present

## 2019-06-29 DIAGNOSIS — J45909 Unspecified asthma, uncomplicated: Secondary | ICD-10-CM | POA: Diagnosis present

## 2019-06-29 DIAGNOSIS — O9952 Diseases of the respiratory system complicating childbirth: Secondary | ICD-10-CM | POA: Diagnosis not present

## 2019-06-29 DIAGNOSIS — Z87891 Personal history of nicotine dependence: Secondary | ICD-10-CM

## 2019-06-29 DIAGNOSIS — M549 Dorsalgia, unspecified: Secondary | ICD-10-CM | POA: Diagnosis not present

## 2019-06-29 DIAGNOSIS — Z3A39 39 weeks gestation of pregnancy: Secondary | ICD-10-CM

## 2019-06-29 DIAGNOSIS — Z3490 Encounter for supervision of normal pregnancy, unspecified, unspecified trimester: Secondary | ICD-10-CM

## 2019-06-29 DIAGNOSIS — Z886 Allergy status to analgesic agent status: Secondary | ICD-10-CM

## 2019-06-29 DIAGNOSIS — Z8659 Personal history of other mental and behavioral disorders: Secondary | ICD-10-CM | POA: Diagnosis not present

## 2019-06-29 DIAGNOSIS — Z8759 Personal history of other complications of pregnancy, childbirth and the puerperium: Secondary | ICD-10-CM | POA: Diagnosis not present

## 2019-06-29 DIAGNOSIS — O9902 Anemia complicating childbirth: Secondary | ICD-10-CM | POA: Diagnosis not present

## 2019-06-29 DIAGNOSIS — O99824 Streptococcus B carrier state complicating childbirth: Secondary | ICD-10-CM | POA: Diagnosis present

## 2019-06-29 DIAGNOSIS — O99891 Other specified diseases and conditions complicating pregnancy: Secondary | ICD-10-CM | POA: Diagnosis not present

## 2019-06-29 LAB — CBC
HCT: 29.4 % — ABNORMAL LOW (ref 36.0–46.0)
Hemoglobin: 9.2 g/dL — ABNORMAL LOW (ref 12.0–15.0)
MCH: 22.5 pg — ABNORMAL LOW (ref 26.0–34.0)
MCHC: 31.3 g/dL (ref 30.0–36.0)
MCV: 71.9 fL — ABNORMAL LOW (ref 80.0–100.0)
Platelets: 201 10*3/uL (ref 150–400)
RBC: 4.09 MIL/uL (ref 3.87–5.11)
RDW: 15.9 % — ABNORMAL HIGH (ref 11.5–15.5)
WBC: 9.4 10*3/uL (ref 4.0–10.5)
nRBC: 0 % (ref 0.0–0.2)

## 2019-06-29 LAB — URINE DRUG SCREEN, QUALITATIVE (ARMC ONLY)
Amphetamines, Ur Screen: NOT DETECTED
Barbiturates, Ur Screen: NOT DETECTED
Benzodiazepine, Ur Scrn: NOT DETECTED
Cannabinoid 50 Ng, Ur ~~LOC~~: NOT DETECTED
Cocaine Metabolite,Ur ~~LOC~~: NOT DETECTED
MDMA (Ecstasy)Ur Screen: NOT DETECTED
Methadone Scn, Ur: NOT DETECTED
Opiate, Ur Screen: NOT DETECTED
Phencyclidine (PCP) Ur S: NOT DETECTED
Tricyclic, Ur Screen: NOT DETECTED

## 2019-06-29 LAB — TYPE AND SCREEN
ABO/RH(D): O POS
Antibody Screen: NEGATIVE

## 2019-06-29 LAB — RESPIRATORY PANEL BY RT PCR (FLU A&B, COVID)
Influenza A by PCR: NEGATIVE
Influenza B by PCR: NEGATIVE
SARS Coronavirus 2 by RT PCR: NEGATIVE

## 2019-06-29 MED ORDER — SODIUM CHLORIDE 0.9 % IV SOLN
5.0000 10*6.[IU] | Freq: Once | INTRAVENOUS | Status: AC
  Administered 2019-06-29: 5 10*6.[IU] via INTRAVENOUS
  Filled 2019-06-29: qty 5

## 2019-06-29 MED ORDER — BUTORPHANOL TARTRATE 1 MG/ML IJ SOLN: 1.0000 mg | INTRAMUSCULAR | Status: DC | PRN

## 2019-06-29 MED ORDER — LACTATED RINGERS IV SOLN: INTRAVENOUS | Status: DC

## 2019-06-29 MED ORDER — OXYTOCIN 40 UNITS IN NORMAL SALINE INFUSION - SIMPLE MED: 2.5000 [IU]/h | INTRAVENOUS | Status: DC

## 2019-06-29 MED ORDER — OXYTOCIN BOLUS FROM INFUSION
500.0000 mL | Freq: Once | INTRAVENOUS | Status: AC
  Administered 2019-06-30: 500 mL via INTRAVENOUS

## 2019-06-29 MED ORDER — OXYCODONE-ACETAMINOPHEN 5-325 MG PO TABS: 1.0000 | ORAL_TABLET | ORAL | Status: DC | PRN

## 2019-06-29 MED ORDER — OXYTOCIN 40 UNITS IN NORMAL SALINE INFUSION - SIMPLE MED
1.0000 m[IU]/min | INTRAVENOUS | Status: DC
  Administered 2019-06-30: 2 m[IU]/min via INTRAVENOUS

## 2019-06-29 MED ORDER — ONDANSETRON HCL 4 MG/2ML IJ SOLN
4.0000 mg | Freq: Four times a day (QID) | INTRAMUSCULAR | Status: DC | PRN
  Administered 2019-06-30: 4 mg via INTRAVENOUS

## 2019-06-29 MED ORDER — OXYCODONE-ACETAMINOPHEN 5-325 MG PO TABS: 2.0000 | ORAL_TABLET | ORAL | Status: DC | PRN

## 2019-06-29 MED ORDER — PROMETHAZINE HCL 25 MG/ML IJ SOLN
12.5000 mg | Freq: Four times a day (QID) | INTRAMUSCULAR | Status: DC | PRN
  Filled 2019-06-29: qty 1

## 2019-06-29 MED ORDER — ACETAMINOPHEN 325 MG PO TABS: 650.0000 mg | ORAL_TABLET | ORAL | Status: DC | PRN

## 2019-06-29 MED ORDER — MISOPROSTOL 50MCG HALF TABLET: 50.0000 ug | ORAL_TABLET | ORAL | Status: DC

## 2019-06-29 MED ORDER — LIDOCAINE 5 % EX PTCH
1.0000 | MEDICATED_PATCH | CUTANEOUS | Status: DC
  Administered 2019-06-29: 1 via TRANSDERMAL
  Filled 2019-06-29: qty 1

## 2019-06-29 MED ORDER — LIDOCAINE HCL (PF) 1 % IJ SOLN
30.0000 mL | INTRAMUSCULAR | Status: AC | PRN
  Administered 2019-06-30: 4 mL via SUBCUTANEOUS

## 2019-06-29 MED ORDER — PENICILLIN G 3 MILLION UNITS IVPB - SIMPLE MED
3.0000 10*6.[IU] | INTRAVENOUS | Status: DC
  Administered 2019-06-30 (×2): 3 10*6.[IU] via INTRAVENOUS
  Filled 2019-06-29 (×2): qty 100

## 2019-06-29 MED ORDER — ZOLPIDEM TARTRATE 5 MG PO TABS: 5.0000 mg | ORAL_TABLET | Freq: Every evening | ORAL | Status: DC | PRN

## 2019-06-29 MED ORDER — SOD CITRATE-CITRIC ACID 500-334 MG/5ML PO SOLN: 30.0000 mL | ORAL | Status: DC | PRN

## 2019-06-29 MED ORDER — MORPHINE SULFATE (PF) 4 MG/ML IV SOLN
4.0000 mg | Freq: Once | INTRAVENOUS | Status: DC
  Filled 2019-06-29: qty 1

## 2019-06-29 MED ORDER — TERBUTALINE SULFATE 1 MG/ML IJ SOLN: 0.2500 mg | Freq: Once | INTRAMUSCULAR | Status: DC | PRN

## 2019-06-29 MED ORDER — LACTATED RINGERS IV SOLN: 500.0000 mL | INTRAVENOUS | Status: DC | PRN

## 2019-06-29 NOTE — Progress Notes (Signed)
Pt off monitor to ambulate.

## 2019-06-29 NOTE — H&P (Signed)
Obstetric History and Physical  [name] [name] is a 24 y.o. L8V5643 with IUP at [redacted]w[redacted]d presenting for elective induction of labor due to prolonged latent phase.   Patient states she has been having  regular, every four (4) to six (6) minutes contractions for the last 23 (4) days, none vaginal bleeding, intact membranes, with active fetal movement.    Denies difficulty breathing or respiratory distress, chest pain, dysuria, and leg pain or swelling.   Prenatal Course  Source of Care: [facility]-initial visit at 13 weeks, total visits: 11  Pregnancy complications or risks: Group Beta Strep Positive, History of marijuana use, Back pain in pregnancy  Prenatal labs and studies:  ABO, Rh: --/--/O POS (04/14 2042)  Antibody: NEG (04/14 2042)  Rubella: 6.72 (10/30 1020)  Varicella: 453 (11/01 0639)  RPR: Non Reactive (02/05 1025)   HBsAg: Negative (10/30 1020)   HIV: Non Reactive (10/30 1020)   PIR:JJOACZYS/-- (03/22 1601)  1 hr Glucola: 106 (02/06 0738)  Genetic screening: Declined  Anatomy US: Complete, normal (12/08 1340)  Past Medical History:  Diagnosis Date  . Abdominal pain   . Anal fissure   . Asthma   . Constipation   . H/O chlamydia infection 10/30/2015  . Headache     Past Surgical History:  Procedure Laterality Date  . KNEE SURGERY Left 2014, 2015    OB History  Gravida Para Term Preterm AB Living  3 2 2     2   SAB TAB Ectopic Multiple Live Births        0 2    # Outcome Date GA Lbr Len/2nd Weight Sex Delivery Anes PTL Lv  3 Current           2 Term 08/04/17 [redacted]w[redacted]d / 00:08 3610 g F Vag-Spont EPI N LIV  1 Term 06/06/16 [redacted]w[redacted]d  4082 g M Vag-Spont EPI N LIV    Social History   Socioeconomic History  . Marital status: Significant Other    Spouse name: [redacted]w[redacted]d  . Number of children: Not on file  . Years of education: Not on file  . Highest education level: Not on file  Occupational History  . Not on file  Tobacco Use  . Smoking status: Former  Everlean Patterson  . Smokeless tobacco: Never Used  Substance and Sexual Activity  . Alcohol use: Not Currently  . Drug use: No  . Sexual activity: Not Currently    Partners: Male  Other Topics Concern  . Not on file  Social History Narrative  . Not on file   Social Determinants of Health   Financial Resource Strain:   . Difficulty of Paying Living Expenses:   Food Insecurity:   . Worried About Games developer in the Last Year:   . Programme researcher, broadcasting/film/video in the Last Year:   Transportation Needs:   . Barista (Medical):   Freight forwarder Lack of Transportation (Non-Medical):   Physical Activity:   . Days of Exercise per Week:   . Minutes of Exercise per Session:   Stress:   . Feeling of Stress :   Social Connections:   . Frequency of Communication with Friends and Family:   . Frequency of Social Gatherings with Friends and Family:   . Attends Religious Services:   . Active Member of Clubs or Organizations:   . Attends [employer] Meetings:   Banker Marital Status:     Family History  Problem Relation Age of Onset  .  Cancer Sister   . Seizures Sister   . Migraines Mother   . Diabetes Paternal Grandmother   . Celiac disease Neg Hx   . Cholelithiasis Neg Hx   . Ulcers Neg Hx   . Breast cancer Neg Hx   . Ovarian cancer Neg Hx   . Colon cancer Neg Hx     Medications Prior to Admission  Medication Sig Dispense Refill Last Dose  . albuterol (PROAIR HFA) 108 (90 Base) MCG/ACT inhaler Inhale into the lungs as needed.      . Iron-FA-B Cmp-C-Biot-Probiotic (FUSION PLUS) CAPS Take 1 tablet by mouth daily. 30 capsule 6   . nitrofurantoin, macrocrystal-monohydrate, (MACROBID) 100 MG capsule Take 1 capsule (100 mg total) by mouth 2 (two) times daily. (Patient not taking: Reported on 04/22/2019) 14 capsule 1   . Prenatal Vit-Fe Fumarate-FA (PRENATAL MULTIVITAMIN) TABS tablet Take 1 tablet by mouth daily at 12 noon.       Allergies  Allergen Reactions  . Ibuprofen Nausea Only and Other  (See Comments)    ibuprofen 800 mg - Vomiting and Shaking  . Naproxen Itching and Nausea Only    Review of Systems: Negative except for what is mentioned in HPI.  Physical Exam:  Temp:  [98.8 F (37.1 C)] 98.8 F (37.1 C) (04/14 1920) Pulse Rate:  [98-107] 98 (04/14 1920) Resp:  [12-14] 14 (04/14 1920) BP: (113-115)/(64-71) 113/64 (04/14 1920)  GENERAL: Well-developed, well-nourished female in no acute distress.   LUNGS: Clear to auscultation bilaterally.   HEART: Regular rate and rhythm.  ABDOMEN: Soft, nontender, nondistended, gravid.  EXTREMITIES: Nontender, no edema, 2+ distal pulses.  Cervical Exam: Dilation: 4 Effacement (%): 70 Cervical Position: Middle Station: -2 Presentation: Vertex Exam by:: Sharyn Lull, CNM [facility] clear fluid, moderate amount  FHR Category I  Contractions: Every four (4) to six (6) minutes, soft resting tone   Pertinent Labs/Studies:   Results for orders placed or performed during the hospital encounter of 06/29/19 (from the past 24 hour(s))  CBC     Status: Abnormal   Collection Time: 06/29/19  8:42 PM  Result Value Ref Range   WBC 9.4 4.0 - 10.5 K/uL   RBC 4.09 3.87 - 5.11 MIL/uL   Hemoglobin 9.2 (L) 12.0 - 15.0 g/dL   HCT 29.4 (L) 36.0 - 46.0 %   MCV 71.9 (L) 80.0 - 100.0 fL   MCH 22.5 (L) 26.0 - 34.0 pg   MCHC 31.3 30.0 - 36.0 g/dL   RDW 15.9 (H) 11.5 - 15.5 %   Platelets 201 150 - 400 K/uL   nRBC 0.0 0.0 - 0.2 %  Type and screen Gary     Status: None   Collection Time: 06/29/19  8:42 PM  Result Value Ref Range   ABO/RH(D) O POS    Antibody Screen NEG    Sample Expiration      07/02/2019,2359 Performed at [facility], [address] [place], [place] [address]   Respiratory Panel by RT PCR (Flu A&B, Covid) - Nasopharyngeal Swab     Status: None   Collection Time: 06/29/19  9:00 PM   Specimen: Nasopharyngeal Swab  Result Value Ref Range   SARS Coronavirus 2 by RT PCR NEGATIVE NEGATIVE    Influenza A by PCR NEGATIVE NEGATIVE   Influenza B by PCR NEGATIVE NEGATIVE    Assessment :  [name] [name] is a 24 y.o. G3P2002 at [redacted]w[redacted]d being admitted for elective induction of labor due to prolonged  latent phase, Rh positive, GBS positive  FHR Category I  Plan:  Admit to birthing suites, see orders.   Labor: Expectant management.  Induction/Augmentation as needed, per protocol.  Delivery plan: Hopeful for vaginal birth.   Dr. Valentino Saxon notified of admission and plan of care.    [name], CNM [facility], [facility] 06/29/19 11:04 PM

## 2019-06-29 NOTE — Telephone Encounter (Signed)
Returned patients call after [name] [name] responded to the message that was sent to her. Patient will come to office to have a labor check by Doreene Burke CNM.

## 2019-06-29 NOTE — Progress Notes (Signed)
[place] CNM notified of pt arrival to unit and cervical exam. Orders given for IM phenergan and Morphine both of which the patient refused. CNM also wanted pt to try [employer] but pt refused that as well. Verbal order given for lidoderm patch and pt agreed to have that placed on her back. Pt up to walk around and also given birthing ball to use. Will recheck cervix in the next hour to reassess.

## 2019-06-29 NOTE — Telephone Encounter (Signed)
Please call patient, let her know that I am not in office today. See if [name] [name] could see her for labor check or she may come to [employer]. Also, she can move her appointment to tomorrow if desired. Thanks, JML

## 2019-06-29 NOTE — OB Triage Note (Signed)
Pt reports CTX since yesterday, gradually getting stronger and some bloody show last night.

## 2019-06-29 NOTE — Telephone Encounter (Signed)
Patient called in saying that she was having contractions but they were in her back. Patient denies loosing mucus plug and vaginal bleeding. Patient stated she didn't want to go to the hospital and have to wait so she was wanting to be seen today.

## 2019-06-30 ENCOUNTER — Inpatient Hospital Stay: Payer: BC Managed Care – PPO | Admitting: Anesthesiology

## 2019-06-30 ENCOUNTER — Encounter: Payer: Self-pay | Admitting: Obstetrics and Gynecology

## 2019-06-30 DIAGNOSIS — F1290 Cannabis use, unspecified, uncomplicated: Secondary | ICD-10-CM

## 2019-06-30 DIAGNOSIS — Z3A39 39 weeks gestation of pregnancy: Secondary | ICD-10-CM

## 2019-06-30 DIAGNOSIS — Z8659 Personal history of other mental and behavioral disorders: Secondary | ICD-10-CM

## 2019-06-30 DIAGNOSIS — B951 Streptococcus, group B, as the cause of diseases classified elsewhere: Secondary | ICD-10-CM

## 2019-06-30 DIAGNOSIS — Z8759 Personal history of other complications of pregnancy, childbirth and the puerperium: Secondary | ICD-10-CM

## 2019-06-30 DIAGNOSIS — M549 Dorsalgia, unspecified: Secondary | ICD-10-CM

## 2019-06-30 DIAGNOSIS — O99891 Other specified diseases and conditions complicating pregnancy: Secondary | ICD-10-CM

## 2019-06-30 LAB — RPR: RPR Ser Ql: NONREACTIVE

## 2019-06-30 MED ORDER — EPHEDRINE 5 MG/ML INJ
10.0000 mg | INTRAVENOUS | Status: DC | PRN
  Administered 2019-06-30: 10 mg via INTRAVENOUS

## 2019-06-30 MED ORDER — MISOPROSTOL 200 MCG PO TABS
ORAL_TABLET | ORAL | Status: AC
  Filled 2019-06-30: qty 3

## 2019-06-30 MED ORDER — FENTANYL 2.5 MCG/ML W/ROPIVACAINE 0.15% IN NS 100 ML EPIDURAL (ARMC)
12.0000 mL/h | EPIDURAL | Status: DC
  Administered 2019-06-30: 02:00:00 12 mL/h via EPIDURAL

## 2019-06-30 MED ORDER — BENZOCAINE-MENTHOL 20-0.5 % EX AERO: 1.0000 "application " | INHALATION_SPRAY | CUTANEOUS | Status: DC | PRN

## 2019-06-30 MED ORDER — OXYTOCIN 10 UNIT/ML IJ SOLN
INTRAMUSCULAR | Status: AC
  Filled 2019-06-30: qty 1

## 2019-06-30 MED ORDER — PHENYLEPHRINE 40 MCG/ML (10ML) SYRINGE FOR IV PUSH (FOR BLOOD PRESSURE SUPPORT): 80.0000 ug | PREFILLED_SYRINGE | INTRAVENOUS | Status: DC | PRN

## 2019-06-30 MED ORDER — METHYLERGONOVINE MALEATE 0.2 MG PO TABS: 0.2000 mg | ORAL_TABLET | ORAL | Status: DC | PRN

## 2019-06-30 MED ORDER — ALBUTEROL SULFATE (2.5 MG/3ML) 0.083% IN NEBU: 3.0000 mL | INHALATION_SOLUTION | RESPIRATORY_TRACT | Status: DC | PRN

## 2019-06-30 MED ORDER — AMMONIA AROMATIC IN INHA
RESPIRATORY_TRACT | Status: AC
  Filled 2019-06-30: qty 10

## 2019-06-30 MED ORDER — EPHEDRINE 5 MG/ML INJ
10.0000 mg | INTRAVENOUS | Status: DC | PRN
  Filled 2019-06-30: qty 4

## 2019-06-30 MED ORDER — FERROUS SULFATE 325 (65 FE) MG PO TABS
325.0000 mg | ORAL_TABLET | Freq: Two times a day (BID) | ORAL | Status: DC
  Administered 2019-06-30 – 2019-07-01 (×2): 325 mg via ORAL
  Filled 2019-06-30 (×2): qty 1

## 2019-06-30 MED ORDER — COCONUT OIL OIL: 1.0000 "application " | TOPICAL_OIL | Status: DC | PRN

## 2019-06-30 MED ORDER — WITCH HAZEL-GLYCERIN EX PADS: 1.0000 "application " | MEDICATED_PAD | CUTANEOUS | Status: DC | PRN

## 2019-06-30 MED ORDER — SODIUM CHLORIDE 0.9 % IV SOLN
INTRAVENOUS | Status: DC | PRN
  Administered 2019-06-30: 5 mL via EPIDURAL
  Administered 2019-06-30: 4 mL via EPIDURAL

## 2019-06-30 MED ORDER — SODIUM CHLORIDE 0.9% FLUSH
3.0000 mL | Freq: Two times a day (BID) | INTRAVENOUS | Status: DC
  Administered 2019-06-30: 3 mL via INTRAVENOUS

## 2019-06-30 MED ORDER — SODIUM CHLORIDE 0.9 % IV SOLN: 250.0000 mL | INTRAVENOUS | Status: DC | PRN

## 2019-06-30 MED ORDER — FENTANYL 2.5 MCG/ML W/ROPIVACAINE 0.15% IN NS 100 ML EPIDURAL (ARMC)
EPIDURAL | Status: AC
  Filled 2019-06-30: qty 100

## 2019-06-30 MED ORDER — OXYTOCIN 40 UNITS IN NORMAL SALINE INFUSION - SIMPLE MED
INTRAVENOUS | Status: AC
  Filled 2019-06-30: qty 1000

## 2019-06-30 MED ORDER — SIMETHICONE 80 MG PO CHEW: 80.0000 mg | CHEWABLE_TABLET | ORAL | Status: DC | PRN

## 2019-06-30 MED ORDER — DIPHENHYDRAMINE HCL 25 MG PO CAPS: 25.0000 mg | ORAL_CAPSULE | Freq: Four times a day (QID) | ORAL | Status: DC | PRN

## 2019-06-30 MED ORDER — ONDANSETRON HCL 4 MG/2ML IJ SOLN: 4.0000 mg | INTRAMUSCULAR | Status: DC | PRN

## 2019-06-30 MED ORDER — MISOPROSTOL 200 MCG PO TABS
ORAL_TABLET | ORAL | Status: AC
  Filled 2019-06-30: qty 1

## 2019-06-30 MED ORDER — DIBUCAINE (PERIANAL) 1 % EX OINT: 1.0000 "application " | TOPICAL_OINTMENT | CUTANEOUS | Status: DC | PRN

## 2019-06-30 MED ORDER — METHYLERGONOVINE MALEATE 0.2 MG/ML IJ SOLN: 0.2000 mg | INTRAMUSCULAR | Status: DC | PRN

## 2019-06-30 MED ORDER — OXYCODONE HCL 5 MG PO TABS
5.0000 mg | ORAL_TABLET | ORAL | Status: DC | PRN
  Administered 2019-06-30 – 2019-07-01 (×3): 5 mg via ORAL
  Filled 2019-06-30 (×3): qty 1

## 2019-06-30 MED ORDER — LACTATED RINGERS IV SOLN
500.0000 mL | Freq: Once | INTRAVENOUS | Status: DC
  Administered 2019-06-30: 500 mL via INTRAVENOUS

## 2019-06-30 MED ORDER — PRENATAL MULTIVITAMIN CH
1.0000 | ORAL_TABLET | Freq: Every day | ORAL | Status: DC
  Administered 2019-06-30 – 2019-07-01 (×2): 1 via ORAL
  Filled 2019-06-30 (×2): qty 1

## 2019-06-30 MED ORDER — SODIUM CHLORIDE 0.9% FLUSH: 3.0000 mL | INTRAVENOUS | Status: DC | PRN

## 2019-06-30 MED ORDER — DIPHENHYDRAMINE HCL 50 MG/ML IJ SOLN: 12.5000 mg | INTRAMUSCULAR | Status: DC | PRN

## 2019-06-30 MED ORDER — LIDOCAINE-EPINEPHRINE (PF) 1.5 %-1:200000 IJ SOLN
INTRAMUSCULAR | Status: DC | PRN
  Administered 2019-06-30: 4 mL via EPIDURAL

## 2019-06-30 MED ORDER — LIDOCAINE HCL (PF) 1 % IJ SOLN
INTRAMUSCULAR | Status: AC
  Filled 2019-06-30: qty 30

## 2019-06-30 MED ORDER — ACETAMINOPHEN 500 MG PO TABS
1000.0000 mg | ORAL_TABLET | Freq: Four times a day (QID) | ORAL | Status: DC
  Administered 2019-06-30 – 2019-07-01 (×5): 1000 mg via ORAL
  Filled 2019-06-30 (×5): qty 2

## 2019-06-30 MED ORDER — OXYCODONE HCL 5 MG PO TABS: 10.0000 mg | ORAL_TABLET | ORAL | Status: DC | PRN

## 2019-06-30 MED ORDER — SENNOSIDES-DOCUSATE SODIUM 8.6-50 MG PO TABS
2.0000 | ORAL_TABLET | ORAL | Status: DC
  Administered 2019-07-01: 2 via ORAL
  Filled 2019-06-30: qty 2

## 2019-06-30 MED ORDER — ONDANSETRON HCL 4 MG PO TABS
4.0000 mg | ORAL_TABLET | ORAL | Status: DC | PRN
  Administered 2019-06-30: 4 mg via ORAL
  Filled 2019-06-30: qty 1

## 2019-06-30 MED ORDER — ONDANSETRON HCL 4 MG/2ML IJ SOLN
INTRAMUSCULAR | Status: AC
  Filled 2019-06-30: qty 2

## 2019-06-30 NOTE — Anesthesia Preprocedure Evaluation (Signed)
Anesthesia Evaluation  Patient identified by MRN, date of birth, ID band Patient awake    Reviewed: Allergy & Precautions, H&P , NPO status , Patient's Chart, lab work & pertinent test results, reviewed documented beta blocker date and time   Airway Mallampati: II  TM Distance: >3 FB Neck ROM: full    Dental no notable dental hx. (+) Teeth Intact   Pulmonary asthma , Current Smoker, former smoker,    Pulmonary exam normal breath sounds clear to auscultation       Cardiovascular Exercise Tolerance: Good negative cardio ROS   Rhythm:regular Rate:Normal     Neuro/Psych  Headaches, negative psych ROS   GI/Hepatic negative GI ROS, Neg liver ROS,   Endo/Other  negative endocrine ROSdiabetes, Gestational  Renal/GU      Musculoskeletal   Abdominal   Peds  Hematology negative hematology ROS (+)   Anesthesia Other Findings   Reproductive/Obstetrics (+) Pregnancy                             Anesthesia Physical Anesthesia Plan  ASA: II  Anesthesia Plan: Epidural   Post-op Pain Management:    Induction:   PONV Risk Score and Plan:   Airway Management Planned:   Additional Equipment:   Intra-op Plan:   Post-operative Plan:   Informed Consent: I have reviewed the patients History and Physical, chart, labs and discussed the procedure including the risks, benefits and alternatives for the proposed anesthesia with the patient or authorized representative who has indicated his/her understanding and acceptance.       Plan Discussed with:   Anesthesia Plan Comments:         Anesthesia Quick Evaluation

## 2019-06-30 NOTE — Anesthesia Procedure Notes (Signed)
Epidural Patient location during procedure: OB  Staffing Performed: anesthesiologist   Preanesthetic Checklist Completed: patient identified, IV checked, site marked, risks and benefits discussed, surgical consent, monitors and equipment checked, pre-op evaluation and timeout performed  Epidural Patient position: sitting Prep: Betadine Patient monitoring: heart rate, continuous pulse ox and blood pressure Approach: midline Location: L4-L5 Injection technique: LOR saline  Needle:  Needle type: Tuohy  Needle gauge: 17 G Needle length: 9 cm and 9 Needle insertion depth: 5 cm Catheter type: closed end flexible Catheter size: 19 Gauge Catheter at skin depth: 12 cm Test dose: negative and 1.5% lidocaine with Epi 1:200 K  Assessment Sensory level: T10 Events: blood not aspirated, injection not painful, no injection resistance, no paresthesia and negative IV test  Additional Notes   Patient tolerated the insertion well without complications.-SATD -IVTD. No paresthesia. Refer to [facility] nursing for VS and dosingReason for block:procedure for pain

## 2019-06-30 NOTE — Progress Notes (Signed)
CSW received consult for hx of marijuana use.  Referral was screened out due to the following: ~MOB had no documented substance use after initial prenatal visit/+UPT. ~MOB had no positive drug screens after initial prenatal visit/+UPT. ~Baby's UDS is negative.-- asked for UDS to be ordered, per notes this was not warranted and UDS order was discontinued. Will monitor CDS.  Please consult CSW if current concerns arise or by MOB's request.  CSW will monitor CDS results and make report to Child Protective Services if warranted.  Assad Harbeson, LCSW [phone]   

## 2019-06-30 NOTE — Discharge Instructions (Signed)

## 2019-07-01 ENCOUNTER — Encounter: Payer: BC Managed Care – PPO | Admitting: Certified Nurse Midwife

## 2019-07-01 LAB — CBC
HCT: 30.4 % — ABNORMAL LOW (ref 36.0–46.0)
Hemoglobin: 9.5 g/dL — ABNORMAL LOW (ref 12.0–15.0)
MCH: 22.8 pg — ABNORMAL LOW (ref 26.0–34.0)
MCHC: 31.3 g/dL (ref 30.0–36.0)
MCV: 72.9 fL — ABNORMAL LOW (ref 80.0–100.0)
Platelets: 217 10*3/uL (ref 150–400)
RBC: 4.17 MIL/uL (ref 3.87–5.11)
RDW: 16 % — ABNORMAL HIGH (ref 11.5–15.5)
WBC: 12.4 10*3/uL — ABNORMAL HIGH (ref 4.0–10.5)
nRBC: 0 % (ref 0.0–0.2)

## 2019-07-01 MED ORDER — ACETAMINOPHEN 500 MG PO TABS: 1000.0000 mg | ORAL_TABLET | Freq: Four times a day (QID) | ORAL | 0 refills | Status: AC

## 2019-07-01 MED ORDER — OXYCODONE HCL 10 MG PO TABS: 10.0000 mg | ORAL_TABLET | ORAL | 0 refills | Status: DC | PRN

## 2019-07-01 MED ORDER — NORETHINDRONE 0.35 MG PO TABS: 1.0000 | ORAL_TABLET | Freq: Every day | ORAL | 11 refills | Status: DC

## 2019-07-01 MED ORDER — FERROUS SULFATE 325 (65 FE) MG PO TABS: 325.0000 mg | ORAL_TABLET | Freq: Two times a day (BID) | ORAL | 3 refills | Status: DC

## 2019-07-01 NOTE — Anesthesia Postprocedure Evaluation (Signed)
Anesthesia Post Note  Patient: [name] [name]  Procedure(s) Performed: AN AD HOC LABOR EPIDURAL  Patient location during evaluation: Women's Unit Anesthesia Type: Epidural Level of consciousness: awake and oriented Pain management: pain level controlled Vital Signs Assessment: post-procedure vital signs reviewed and stable Respiratory status: spontaneous breathing, respiratory function stable and nonlabored ventilation Cardiovascular status: blood pressure returned to baseline and stable Postop Assessment: no backache and no headache Anesthetic complications: no     Last Vitals:  Vitals:   06/30/19 2322 07/01/19 0745  BP: (!) 105/59 115/70  Pulse: 83 75  Resp: 18 14  Temp: 36.9 C 36.9 C  SpO2: 100% 100%    Last Pain:  Vitals:   07/01/19 0745  TempSrc: Oral  PainSc: 0-No pain                 Ginger Carne

## 2019-07-01 NOTE — Discharge Summary (Signed)
Physician Obstetric Discharge Summary  Patient ID: TAYEN NARANG MRN: [id number] DOB/AGE: 1995/06/28 24 y.o.   Date of Admission: 06/29/2019  Date of Discharge:   Admitting Diagnosis: Induction of labor at [redacted]w[redacted]d  Secondary Diagnosis: Anemia in pregnancy, GBS positive  Mode of Delivery: normal spontaneous vaginal delivery     Discharge Diagnosis: No other diagnosis   Intrapartum Procedures: Atificial rupture of membranes, epidural, GBS prophylaxis and pitocin augmentation   Post partum procedures: None  Complications: None   Brief Hospital Course   Ishita R Swango is a G3P3003 who had a SVD on 06/30/2019;  for further details of this birth, please refer to the delivey summary.  Patient had an uncomplicated postpartum course.  By time of discharge on PPD#1, her pain was controlled on oral pain medications; she had appropriate lochia and was ambulating, voiding without difficulty and tolerating regular diet.  She was deemed stable for discharge to home.    Labs: CBC Latest Ref Rng & Units 07/01/2019 06/29/2019 04/22/2019  WBC 4.0 - 10.5 K/uL 12.4(H) 9.4 9.5  Hemoglobin 12.0 - 15.0 g/dL 9.5(L) 9.2(L) 10.4(L)  Hematocrit 36.0 - 46.0 % 30.4(L) 29.4(L) 32.3(L)  Platelets 150 - 400 K/uL 217 201 202   O POS  Physical exam:   Temp:  [98.2 F (36.8 C)-98.5 F (36.9 C)] 98.5 F (36.9 C) (04/16 0745) Pulse Rate:  [73-83] 75 (04/16 0745) Resp:  [14-18] 14 (04/16 0745) BP: (105-115)/(59-70) 115/70 (04/16 0745) SpO2:  [100 %] 100 % (04/16 0745)  General: alert and no distress  Lochia: appropriate  Abdomen: soft, NT  Uterine Fundus: firm  Perineum: intact, no significant drainage, no dehiscence, no significant erythema  Extremities: No evidence of DVT seen on physical exam. No lower extremity edema.  Edinburgh Postnatal Depression Scale Screening Tool 07/01/2019 06/30/2019  I have been able to laugh and see the funny side of things. 0 (No Data)  I have looked forward with enjoyment  to things. 0 -  I have blamed myself unnecessarily when things went wrong. 0 -  I have been anxious or worried for no good reason. 1 -  I have felt scared or panicky for no good reason. 0 -  Things have been getting on top of me. 1 -  I have been so unhappy that I have had difficulty sleeping. 2 -  I have felt sad or miserable. 0 -  I have been so unhappy that I have been crying. 1 -  The thought of harming myself has occurred to me. 0 -  Edinburgh Postnatal Depression Scale Total 5 -     Discharge Instructions: Per After Visit Summary.  Activity: Advance as tolerated. Pelvic rest for 6 weeks.  Also refer to After Visit Summary  Diet: Regular Medications: Allergies as of 07/01/2019      Reactions   Ibuprofen Nausea Only, Other (See Comments)   ibuprofen 800 mg - Vomiting and Shaking   Naproxen Itching, Nausea Only      Medication List    STOP taking these medications   Fusion Plus Caps   nitrofurantoin (macrocrystal-monohydrate) 100 MG capsule Commonly known as: MACROBID     TAKE these medications   acetaminophen 500 MG tablet Commonly known as: TYLENOL Take 2 tablets (1,000 mg total) by mouth every 6 (six) hours.   ferrous sulfate 325 (65 FE) MG tablet Take 1 tablet (325 mg total) by mouth 2 (two) times daily with a meal.   Oxycodone HCl 10 MG Tabs Take 1  tablet (10 mg total) by mouth every 4 (four) hours as needed (pain scale > 7).   prenatal multivitamin Tabs tablet Take 1 tablet by mouth daily at 12 noon.   ProAir HFA 108 (90 Base) MCG/ACT inhaler Generic drug: albuterol Inhale into the lungs as needed.      Outpatient follow up:  Follow-up Information    [facility]. Schedule an appointment as soon as possible for a visit in 6 week(s).   Contact information: [address]  [address] [place] [address] [phone]         Postpartum contraception: oral progesterone-only contraceptive then Mirena IUD  placement  Discharged Condition: stable  Discharged to: home   Newborn Data:  Disposition:home with mother  Apgars: APGAR (1 MIN): 7   APGAR (5 MINS): 9    Baby Feeding: Formula   [name], CNM [facility], [facility] 07/01/19 4:00 PM

## 2019-07-01 NOTE — Progress Notes (Signed)
Patient discharged home with infant. Discharge instructions and prescriptions given and reviewed with patient. Patient verbalized understanding. Escorted out by auxillary.  

## 2019-07-01 NOTE — Progress Notes (Signed)
Patient ID: [name] [name], female   DOB: 1995/12/28, 24 y.o.   MRN: [id number]   Post Partum Day # 1, s/p spontaneous vaginal birth, Rh positive, Anemia   Subjective:  Patient sitting up in bed, reports back pain.   Infant at bedside in crib. Patient's mother at bedside for continuous support.   Denies difficulty breathing or respiratory distress, chest pain, abdominal pain, excessive vaginal bleeding, dysuria, and leg pain or swelling.   Objective: Temp:  [98.2 F (36.8 C)-98.6 F (37 C)] 98.5 F (36.9 C) (04/16 0745) Pulse Rate:  [73-83] 75 (04/16 0745) Resp:  [14-18] 14 (04/16 0745) BP: (105-115)/(50-70) 115/70 (04/16 0745) SpO2:  [98 %-100 %] 100 % (04/16 0745)  Physical Exam:   General: alert and cooperative   Lungs: clear to auscultation bilaterally  Breasts: deferred, no complaints  Heart: normal apical impulse  Abdomen: soft, non-tender; bowel sounds normal; no masses,  no organomegaly  Pelvis: Lochia: appropriate, Uterine  Fundus: firm  Extremities: DVT Evaluation: No evidence of DVT seen on physical exam.  Recent Labs    06/29/19 2042 07/01/19 0612  HGB 9.2* 9.5*  HCT 29.4* 30.4*    Assessment:  24 year old G3P3, Post Partum Day # 1, s/p spontaneous vaginal birth, Rh positive, Anemia   Formula feeding  Plan:  Routine postpartum education and care.   Plan for discharge tomorrow or later today if desired   LOS: 2 days   [name], CNM [facility], [facility] [place] 07/01/2019 10:41 AM

## 2019-07-14 ENCOUNTER — Other Ambulatory Visit: Payer: Self-pay

## 2019-07-14 ENCOUNTER — Ambulatory Visit (INDEPENDENT_AMBULATORY_CARE_PROVIDER_SITE_OTHER): Payer: BC Managed Care – PPO | Admitting: Certified Nurse Midwife

## 2019-07-14 DIAGNOSIS — Z1331 Encounter for screening for depression: Secondary | ICD-10-CM

## 2019-07-14 DIAGNOSIS — Z8759 Personal history of other complications of pregnancy, childbirth and the puerperium: Secondary | ICD-10-CM

## 2019-07-14 DIAGNOSIS — Z8659 Personal history of other mental and behavioral disorders: Secondary | ICD-10-CM | POA: Diagnosis not present

## 2019-07-14 NOTE — Progress Notes (Signed)
Received transferred call from [name] [name] for a televisit. DOB as identifier. Patient states she is doing well. Is bottle feeding. States her flow is done. She scored a 7 on the PhQ9. Call transferred to [facility] CNM.

## 2019-07-14 NOTE — Progress Notes (Signed)
Virtual Visit via Telephone Note  I connected with Arzu R Chunn on 07/14/19 at  1:45 PM EDT by telephone and verified that I am speaking with the correct person using two identifiers.  Location:  Patient: [name] [name]  Provider: [name], CNM   I discussed the limitations, risks, security and privacy concerns of performing an evaluation and management service by telephone and the availability of in person appointments. I also discussed with the patient that there may be a patient responsible charge related to this service. The patient expressed understanding and agreed to proceed.   History of Present Illness:  Patient is two (2) status postpartum vaginal birth of third child.   Formula feeding without difficulty or concern.   Vaginal bleeding has stopped. Eating, drinking, voiding and stooling without difficulty; constipation has resolved.   Working on getting all the children on a schedule. Sleeping when the baby sleeps. Siblings are "obsessed" with new baby.   Feeling tired, but has help.   Denies difficulty breathing or respiratory distress, chest pain, abdominal pain, excessive vaginal bleeding, dysuria, and leg pain or swelling.   Observations/Objective:  Depression screen [facility] 2/9 07/14/2019 01/05/2018 10/01/2017 10/28/2016 07/18/2016  Decreased Interest 1 3 0 0 1  Down, Depressed, Hopeless 0 1 0 0 3  PHQ - 2 Score 1 4 0 0 4  Altered sleeping 0 0 0 0 1  Tired, decreased energy 3 3 3 1 3   Change in appetite 3 3 3 1 1   Feeling bad or failure about yourself  0 1 0 0 0  Trouble concentrating 0 1 0 0 1  Moving slowly or fidgety/restless 0 0 0 0 0  Suicidal thoughts 0 0 0 0 0  PHQ-9 Score 7 12 6 2 10   Difficult doing work/chores Not difficult at all Somewhat difficult - Not difficult at all -     Assessment and Plan:  Postpartum care and examination Depression screen negative History of postpartum depression  Follow Up Instructions:  Reviewed flag  symptoms and when to call.   RTC x 4 weeks for PPV or sooner if needed.    I discussed the assessment and treatment plan with the patient. The patient was provided an opportunity to ask questions and all were answered. The patient agreed with the plan and demonstrated an understanding of the instructions.   The patient was advised to call back or seek an in-person evaluation if the symptoms worsen or if the condition fails to improve as anticipated.  I provided 6 minutes of non-face-to-face time during this encounter.   , CNM [facility], [facility] 07/14/19 4:26 PM

## 2019-07-28 ENCOUNTER — Encounter: Payer: BC Managed Care – PPO | Admitting: Certified Nurse Midwife

## 2019-08-11 ENCOUNTER — Other Ambulatory Visit: Payer: Self-pay

## 2019-08-11 ENCOUNTER — Ambulatory Visit (INDEPENDENT_AMBULATORY_CARE_PROVIDER_SITE_OTHER): Payer: BC Managed Care – PPO | Admitting: Certified Nurse Midwife

## 2019-08-11 ENCOUNTER — Encounter: Payer: Self-pay | Admitting: Certified Nurse Midwife

## 2019-08-11 DIAGNOSIS — Z30013 Encounter for initial prescription of injectable contraceptive: Secondary | ICD-10-CM | POA: Diagnosis not present

## 2019-08-11 MED ORDER — MEDROXYPROGESTERONE ACETATE 150 MG/ML IM SUSP: 150.0000 mg | INTRAMUSCULAR | 4 refills | Status: AC

## 2019-08-11 MED ORDER — MEDROXYPROGESTERONE ACETATE 150 MG/ML IM SUSP
150.0000 mg | Freq: Once | INTRAMUSCULAR | Status: AC
  Administered 2019-08-11: 150 mg via INTRAMUSCULAR

## 2019-08-11 NOTE — Progress Notes (Signed)
I have seen, interviewed, and examined the patient in conjunction with the [employer] Nurse Practitioner student and affirm the diagnosis and management plan.   [name], CNM [facility], [facility] 08/11/19 3:23 PM

## 2019-08-11 NOTE — Progress Notes (Signed)
Subjective:    [name] [name] is a 24 y.o. [id number] African American female who presents for a postpartum visit. She is 6 weeks postpartum following a spontaneous vaginal delivery at 39.3 gestational weeks. Anesthesia: epidural. I have fully reviewed the prenatal and intrapartum course.   Postpartum course has been uncomplicated. Baby's course has been uncomplicated. Baby is feeding by bottle - Gerber Gentle.   Bleeding no bleeding. Bowel function is normal. Bladder function is normal. Patient is not sexually active. Contraception method is none.   Postpartum depression screening: negative. Score 4.   Reports irritability at night. Older children do not want to go to bed.   Support at home is father of the children and her mother.   Last pap 01/05/18 and was Negative.  Reports low back pain since birth that is around the area of her epidural that improves with use of lidocaine and prescription medication.   Denies difficulty breathing or respiratory distress, chest pain, abdominal pain, excessive vaginal bleeding, dysuria, leg pain or swelling.   The following portions of the patient's history were reviewed and updated as appropriate: allergies, current medications, past medical history, past surgical history and problem list.  Review of Systems  ROS- negative except as noted above. Information obtained from patient.   Objective:   BP (!) 116/50   Pulse 70   Ht 5\' 4"  (1.626 m)   Wt 177 lb (80.3 kg)   LMP 08/01/2019 (Approximate)   Breastfeeding No   BMI 30.38 kg/m   General:  alert, cooperative and no distress   Breasts:  deferred, no complaints  Lungs: clear to auscultation bilaterally  Heart:  regular rate and rhythm  Abdomen: soft, nontender   Vulva: normal  Vagina: normal vagina  Cervix:  closed  Corpus: Well-involuted  Adnexa:  Non-palpable  Rectal Exam: No hemorrhoids   Depression screen [facility] 2/9 08/11/2019 07/14/2019 01/05/2018 10/01/2017 10/28/2016  Decreased  Interest 0 1 3 0 0  Down, Depressed, Hopeless 0 0 1 0 0  PHQ - 2 Score 0 1 4 0 0  Altered sleeping 0 0 0 0 0  Tired, decreased energy 0 3 3 3 1   Change in appetite 3 3 3 3 1   Feeling bad or failure about yourself  1 0 1 0 0  Trouble concentrating 0 0 1 0 0  Moving slowly or fidgety/restless 0 0 0 0 0  Suicidal thoughts 0 0 0 0 0  PHQ-9 Score 4 7 12 6 2   Difficult doing work/chores Not difficult at all Not difficult at all Somewhat difficult - Not difficult at all         Assessment:   Postpartum exam 6 wks s/p spontaneous vaginal delivery of live female infant Bottle feeding Depression screening Contraception counseling  Initiation of Depo-Provera Low Back Pain  Plan:    Rx Depo provera injection, see orders. First given in office today, see chart.   Encouraged to follow up for preventative care.   Treatment options discussed for low back pain included Tylenol prn, lidocaine patches and heating pain. Offered PT referral, patient declines at this time.   Reviewed red flags and when to call.   RTC x 3 months for depo provera injection.   Follow up in: 6 months for ANNUAL EXAM or earlier if needed.    10/30/2016 RN [facility] [employer] 08/11/19 1:58 PM

## 2019-08-11 NOTE — Patient Instructions (Addendum)
Medroxyprogesterone injection [Contraceptive] What is this medicine? MEDROXYPROGESTERONE (me DROX ee proe JES te rone) contraceptive injections prevent pregnancy. They provide effective birth control for 3 months. Depo-subQ Provera 104 is also used for treating pain related to endometriosis. This medicine may be used for other purposes; ask your health care provider or pharmacist if you have questions. COMMON BRAND NAME(S): Depo-Provera, Depo-subQ Provera 104 What should I tell my health care provider before I take this medicine? They need to know if you have any of these conditions:  frequently drink alcohol  asthma  blood vessel disease or a history of a blood clot in the lungs or legs  bone disease such as osteoporosis  breast cancer  diabetes  eating disorder (anorexia nervosa or bulimia)  high blood pressure  HIV infection or AIDS  kidney disease  liver disease  mental depression  migraine  seizures (convulsions)  stroke  tobacco smoker  vaginal bleeding  an unusual or allergic reaction to medroxyprogesterone, other hormones, medicines, foods, dyes, or preservatives  pregnant or trying to get pregnant  breast-feeding How should I use this medicine? Depo-Provera Contraceptive injection is given into a muscle. Depo-subQ Provera 104 injection is given under the skin. These injections are given by a health care professional. You must not be pregnant before getting an injection. The injection is usually given during the first 5 days after the start of a menstrual period or 6 weeks after delivery of a baby. Talk to your pediatrician regarding the use of this medicine in children. Special care may be needed. These injections have been used in female children who have started having menstrual periods. Overdosage: If you think you have taken too much of this medicine contact a poison control center or emergency room at once. NOTE: This medicine is only for you. Do not  share this medicine with others. What if I miss a dose? Try not to miss a dose. You must get an injection once every 3 months to maintain birth control. If you cannot keep an appointment, call and reschedule it. If you wait longer than 13 weeks between Depo-Provera contraceptive injections or longer than 14 weeks between Depo-subQ Provera 104 injections, you could get pregnant. Use another method for birth control if you miss your appointment. You may also need a pregnancy test before receiving another injection. What may interact with this medicine? Do not take this medicine with any of the following medications:  bosentan This medicine may also interact with the following medications:  aminoglutethimide  antibiotics or medicines for infections, especially rifampin, rifabutin, rifapentine, and griseofulvin  aprepitant  barbiturate medicines such as phenobarbital or primidone  bexarotene  carbamazepine  medicines for seizures like ethotoin, felbamate, oxcarbazepine, phenytoin, topiramate  modafinil  St. John's wort This list may not describe all possible interactions. Give your health care provider a list of all the medicines, herbs, non-prescription drugs, or dietary supplements you use. Also tell them if you smoke, drink alcohol, or use illegal drugs. Some items may interact with your medicine. What should I watch for while using this medicine? This drug does not protect you against HIV infection (AIDS) or other sexually transmitted diseases. Use of this product may cause you to lose calcium from your bones. Loss of calcium may cause weak bones (osteoporosis). Only use this product for more than 2 years if other forms of birth control are not right for you. The longer you use this product for birth control the more likely you will be at risk   for weak bones. Ask your health care professional how you can keep strong bones. You may have a change in bleeding pattern or irregular periods.  Many females stop having periods while taking this drug. If you have received your injections on time, your chance of being pregnant is very low. If you think you may be pregnant, see your health care professional as soon as possible. Tell your health care professional if you want to get pregnant within the next year. The effect of this medicine may last a long time after you get your last injection. What side effects may I notice from receiving this medicine? Side effects that you should report to your doctor or health care professional as soon as possible:  allergic reactions like skin rash, itching or hives, swelling of the face, lips, or tongue  breast tenderness or discharge  breathing problems  changes in vision  depression  feeling faint or lightheaded, falls  fever  pain in the abdomen, chest, groin, or leg  problems with balance, talking, walking  unusually weak or tired  yellowing of the eyes or skin Side effects that usually do not require medical attention (report to your doctor or health care professional if they continue or are bothersome):  acne  fluid retention and swelling  headache  irregular periods, spotting, or absent periods  temporary pain, itching, or skin reaction at site where injected  weight gain This list may not describe all possible side effects. Call your doctor for medical advice about side effects. You may report side effects to FDA at 1-800-FDA-1088. Where should I keep my medicine? This does not apply. The injection will be given to you by a health care professional. NOTE: This sheet is a summary. It may not cover all possible information. If you have questions about this medicine, talk to your doctor, pharmacist, or health care provider.  2020 Elsevier/Gold Standard (2008-03-24 18:37:56)   Chronic Back Pain When back pain lasts longer than 3 months, it is called chronic back pain. Pain may get worse at certain times (flare-ups).  There are things you can do at home to manage your pain. Follow these instructions at home: Activity      Avoid bending and other activities that make pain worse.  When standing: ? Keep your upper back and neck straight. ? Keep your shoulders pulled back. ? Avoid slouching.  When sitting: ? Keep your back straight. ? Relax your shoulders. Do not round your shoulders or pull them backward.  Do not sit or stand in one place for long periods of time.  Take short rest breaks during the day. Lying down or standing is usually better than sitting. Resting can help relieve pain.  When sitting or lying down for a long time, do some mild activity or stretching. This will help to prevent stiffness and pain.  Get regular exercise. Ask your doctor what activities are safe for you.  Do not lift anything that is heavier than 10 lb (4.5 kg). To prevent injury when you lift things: ? Bend your knees. ? Keep the weight close to your body. ? Avoid twisting. Managing pain  If told, put ice on the painful area. Your doctor may tell you to use ice for 24-48 hours after a flare-up starts. ? Put ice in a plastic bag. ? Place a towel between your skin and the bag. ? Leave the ice on for 20 minutes, 2-3 times a day.  If told, put heat on the painful area  as often as told by your doctor. Use the heat source that your doctor recommends, such as a moist heat pack or a heating pad. ? Place a towel between your skin and the heat source. ? Leave the heat on for 20-30 minutes. ? Remove the heat if your skin turns bright red. This is especially important if you are unable to feel pain, heat, or cold. You may have a greater risk of getting burned.  Soak in a warm bath. This can help relieve pain.  Take over-the-counter and prescription medicines only as told by your doctor. General instructions  Sleep on a firm mattress. Try lying on your side with your knees slightly bent. If you lie on your back, put a  pillow under your knees.  Keep all follow-up visits as told by your doctor. This is important. Contact a doctor if:  You have pain that does not get better with rest or medicine. Get help right away if:  One or both of your arms or legs feel weak.  One or both of your arms or legs lose feeling (numbness).  You have trouble controlling when you poop (bowel movement) or pee (urinate).  You feel sick to your stomach (nauseous).  You throw up (vomit).  You have belly (abdominal) pain.  You have shortness of breath.  You pass out (faint). Summary  When back pain lasts longer than 3 months, it is called chronic back pain.  Pain may get worse at certain times (flare-ups).  Use ice and heat as told by your doctor. Your doctor may tell you to use ice after flare-ups. This information is not intended to replace advice given to you by your health care provider. Make sure you discuss any questions you have with your health care provider. Document Revised: 06/24/2018 Document Reviewed: 10/16/2016 Elsevier Patient Education  2020 Elsevier Inc.    Postpartum Care After Vaginal Delivery This sheet gives you information about how to care for yourself from the time you deliver your baby to up to 6-12 weeks after delivery (postpartum period). Your health care provider may also give you more specific instructions. If you have problems or questions, contact your health care provider. Follow these instructions at home: Vaginal bleeding  It is normal to have vaginal bleeding (lochia) after delivery. Wear a sanitary pad for vaginal bleeding and discharge. ? During the first week after delivery, the amount and appearance of lochia is often similar to a menstrual period. ? Over the next few weeks, it will gradually decrease to a dry, yellow-brown discharge. ? For most women, lochia stops completely by 4-6 weeks after delivery. Vaginal bleeding can vary from woman to woman.  Change your sanitary  pads frequently. Watch for any changes in your flow, such as: ? A sudden increase in volume. ? A change in color. ? Large blood clots.  If you pass a blood clot from your vagina, save it and call your health care provider to discuss. Do not flush blood clots down the toilet before talking with your health care provider.  Do not use tampons or douches until your health care provider says this is safe.  If you are not breastfeeding, your period should return 6-8 weeks after delivery. If you are feeding your child breast milk only (exclusive breastfeeding), your period may not return until you stop breastfeeding. Perineal care  Keep the area between the vagina and the anus (perineum) clean and dry as told by your health care provider. Use medicated pads  and pain-relieving sprays and creams as directed.  If you had a cut in the perineum (episiotomy) or a tear in the vagina, check the area for signs of infection until you are healed. Check for: ? More redness, swelling, or pain. ? Fluid or blood coming from the cut or tear. ? Warmth. ? Pus or a bad smell.  You may be given a squirt bottle to use instead of wiping to clean the perineum area after you go to the bathroom. As you start healing, you may use the squirt bottle before wiping yourself. Make sure to wipe gently.  To relieve pain caused by an episiotomy, a tear in the vagina, or swollen veins in the anus (hemorrhoids), try taking a warm sitz bath 2-3 times a day. A sitz bath is a warm water bath that is taken while you are sitting down. The water should only come up to your hips and should cover your buttocks. Breast care  Within the first few days after delivery, your breasts may feel heavy, full, and uncomfortable (breast engorgement). Milk may also leak from your breasts. Your health care provider can suggest ways to help relieve the discomfort. Breast engorgement should go away within a few days.  If you are breastfeeding: ? Wear a  bra that supports your breasts and fits you well. ? Keep your nipples clean and dry. Apply creams and ointments as told by your health care provider. ? You may need to use breast pads to absorb milk that leaks from your breasts. ? You may have uterine contractions every time you breastfeed for up to several weeks after delivery. Uterine contractions help your uterus return to its normal size. ? If you have any problems with breastfeeding, work with your health care provider or Advertising copywriter.  If you are not breastfeeding: ? Avoid touching your breasts a lot. Doing this can make your breasts produce more milk. ? Wear a good-fitting bra and use cold packs to help with swelling. ? Do not squeeze out (express) milk. This causes you to make more milk. Intimacy and sexuality  Ask your health care provider when you can engage in sexual activity. This may depend on: ? Your risk of infection. ? How fast you are healing. ? Your comfort and desire to engage in sexual activity.  You are able to get pregnant after delivery, even if you have not had your period. If desired, talk with your health care provider about methods of birth control (contraception). Medicines  Take over-the-counter and prescription medicines only as told by your health care provider.  If you were prescribed an antibiotic medicine, take it as told by your health care provider. Do not stop taking the antibiotic even if you start to feel better. Activity  Gradually return to your normal activities as told by your health care provider. Ask your health care provider what activities are safe for you.  Rest as much as possible. Try to rest or take a nap while your baby is sleeping. Eating and drinking   Drink enough fluid to keep your urine pale yellow.  Eat high-fiber foods every day. These may help prevent or relieve constipation. High-fiber foods include: ? Whole grain cereals and breads. ? Brown  rice. ? Beans. ? Fresh fruits and vegetables.  Do not try to lose weight quickly by cutting back on calories.  Take your prenatal vitamins until your postpartum checkup or until your health care provider tells you it is okay to stop. Lifestyle  Do not use any products that contain nicotine or tobacco, such as cigarettes and e-cigarettes. If you need help quitting, ask your health care provider.  Do not drink alcohol, especially if you are breastfeeding. General instructions  Keep all follow-up visits for you and your baby as told by your health care provider. Most women visit their health care provider for a postpartum checkup within the first 3-6 weeks after delivery. Contact a health care provider if:  You feel unable to cope with the changes that your child brings to your life, and these feelings do not go away.  You feel unusually sad or worried.  Your breasts become red, painful, or hard.  You have a fever.  You have trouble holding urine or keeping urine from leaking.  You have little or no interest in activities you used to enjoy.  You have not breastfed at all and you have not had a menstrual period for 12 weeks after delivery.  You have stopped breastfeeding and you have not had a menstrual period for 12 weeks after you stopped breastfeeding.  You have questions about caring for yourself or your baby.  You pass a blood clot from your vagina. Get help right away if:  You have chest pain.  You have difficulty breathing.  You have sudden, severe leg pain.  You have severe pain or cramping in your lower abdomen.  You bleed from your vagina so much that you fill more than one sanitary pad in one hour. Bleeding should not be heavier than your heaviest period.  You develop a severe headache.  You faint.  You have blurred vision or spots in your vision.  You have bad-smelling vaginal discharge.  You have thoughts about hurting yourself or your baby. If you  ever feel like you may hurt yourself or others, or have thoughts about taking your own life, get help right away. You can go to the nearest emergency department or call:  Your local emergency services (911 in the [place]  A suicide crisis helpline, such as the National Suicide Prevention Lifeline at 407-272-6814. This is open 24 hours a day. Summary  The period of time right after you deliver your newborn up to 6-12 weeks after delivery is called the postpartum period.  Gradually return to your normal activities as told by your health care provider.  Keep all follow-up visits for you and your baby as told by your health care provider. This information is not intended to replace advice given to you by your health care provider. Make sure you discuss any questions you have with your health care provider. Document Revised: 03/06/2017 Document Reviewed: 12/15/2016 Elsevier Patient Education  2020 [employer].

## 2019-09-12 ENCOUNTER — Encounter: Payer: BC Managed Care – PPO | Admitting: Certified Nurse Midwife

## 2019-09-15 DIAGNOSIS — Z419 Encounter for procedure for purposes other than remedying health state, unspecified: Secondary | ICD-10-CM | POA: Diagnosis not present

## 2019-09-16 ENCOUNTER — Ambulatory Visit (INDEPENDENT_AMBULATORY_CARE_PROVIDER_SITE_OTHER): Payer: Medicaid Other | Admitting: Certified Nurse Midwife

## 2019-09-16 ENCOUNTER — Encounter: Payer: Self-pay | Admitting: Certified Nurse Midwife

## 2019-09-16 ENCOUNTER — Other Ambulatory Visit: Payer: Self-pay

## 2019-09-16 VITALS — BP 104/61 | HR 73 | Ht 64.0 in | Wt 177.4 lb

## 2019-09-16 DIAGNOSIS — N8110 Cystocele, unspecified: Secondary | ICD-10-CM | POA: Diagnosis not present

## 2019-09-16 NOTE — Patient Instructions (Signed)
Pelvic Organ Prolapse Pelvic organ prolapse is the stretching, bulging, or dropping of pelvic organs into an abnormal position. It happens when the muscles and tissues that surround and support pelvic structures become weak or stretched. Pelvic organ prolapse can involve the:  Vagina (vaginal prolapse).  Uterus (uterine prolapse).  Bladder (cystocele).  Rectum (rectocele).  Intestines (enterocele). When organs other than the vagina are involved, they often bulge into the vagina or protrude from the vagina, depending on how severe the prolapse is. What are the causes? This condition may be caused by:  Pregnancy, labor, and childbirth.  Past pelvic surgery.  Decreased production of the hormone estrogen associated with menopause.  Consistently lifting more than 50 lb (23 kg).  Obesity.  Long-term inability to pass stool (chronic constipation).  A cough that lasts a long time (chronic).  Buildup of fluid in the abdomen due to certain diseases and other conditions. What are the signs or symptoms? Symptoms of this condition include:  Passing a little urine (loss of bladder control) when you cough, sneeze, strain, and exercise (stress incontinence). This may be worse immediately after childbirth. It may gradually improve over time.  Feeling pressure in your pelvis or vagina. This pressure may increase when you cough or when you are passing stool.  A bulge that protrudes from the opening of your vagina.  Difficulty passing urine or stool.  Pain in your lower back.  Pain, discomfort, or disinterest in sex.  Repeated bladder infections (urinary tract infections).  Difficulty inserting a tampon. In some people, this condition causes no symptoms. How is this diagnosed? This condition may be diagnosed based on a vaginal and rectal exam. During the exam, you may be asked to cough and strain while you are lying down, sitting, and standing up. Your health care provider will  determine if other tests are required, such as bladder function tests. How is this treated? Treatment for this condition may depend on your symptoms. Treatment may include:  Lifestyle changes, such as changes to your diet.  Emptying your bladder at scheduled times (bladder training therapy). This can help reduce or avoid urinary incontinence.  Estrogen. Estrogen may help mild prolapse by increasing the strength and tone of pelvic floor muscles.  Kegel exercises. These may help mild cases of prolapse by strengthening and tightening the muscles of the pelvic floor.  A soft, flexible device that helps support the vaginal walls and keep pelvic organs in place (pessary). This is inserted into your vagina by your health care provider.  Surgery. This is often the only form of treatment for severe prolapse. Follow these instructions at home:  Avoid drinking beverages that contain caffeine or alcohol.  Increase your intake of high-fiber foods. This can help decrease constipation and straining during bowel movements.  Lose weight if recommended by your health care provider.  Wear a sanitary pad or adult diapers if you have urinary incontinence.  Avoid heavy lifting and straining with exercise and work. Do not hold your breath when you perform mild to moderate lifting and exercise activities. Limit your activities as directed by your health care provider.  Do Kegel exercises as directed by your health care provider. To do this: ? Squeeze your pelvic floor muscles tight. You should feel a tight lift in your rectal area and a tightness in your vaginal area. Keep your stomach, buttocks, and legs relaxed. ? Hold the muscles tight for up to 10 seconds. ? Relax your muscles. ? Repeat this exercise 50 times a day,   or as many times as told by your health care provider. Continue to do this exercise for at least 4-6 weeks, or for as long as told by your health care provider.  Take over-the-counter and  prescription medicines only as told by your health care provider.  If you have a pessary, take care of it as told by your health care provider.  Keep all follow-up visits as told by your health care provider. This is important. Contact a health care provider if you:  Have symptoms that interfere with your daily activities or sex life.  Need medicine to help with the discomfort.  Notice bleeding from your vagina that is not related to your period.  Have a fever.  Have pain or bleeding when you urinate.  Have bleeding when you pass stool.  Pass urine when you have sex.  Have chronic constipation.  Have a pessary that falls out.  Have bad smelling vaginal discharge.  Have an unusual, low pain in your abdomen. Summary  Pelvic organ prolapse is the stretching, bulging, or dropping of pelvic organs into an abnormal position. It happens when the muscles and tissues that surround and support pelvic structures become weak or stretched.  When organs other than the vagina are involved, they often bulge into the vagina or protrude from the vagina, depending on how severe the prolapse is.  In most cases, this condition needs to be treated only if it produces symptoms. Treatment may include lifestyle changes, estrogen, Kegel exercises, pessary insertion, or surgery.  Avoid heavy lifting and straining with exercise and work. Do not hold your breath when you perform mild to moderate lifting and exercise activities. Limit your activities as directed by your health care provider. This information is not intended to replace advice given to you by your health care provider. Make sure you discuss any questions you have with your health care provider. Document Revised: 03/25/2017 Document Reviewed: 03/25/2017 Elsevier Patient Education  2020 Elsevier Inc.  

## 2019-09-20 DIAGNOSIS — N8110 Cystocele, unspecified: Secondary | ICD-10-CM | POA: Insufficient documentation

## 2019-09-20 NOTE — Progress Notes (Signed)
GYN ENCOUNTER NOTE  Subjective:       [name] [name] is a 24 y.o. [id number] female is here for gynecologic evaluation of the following issues:  1. Possible prolapse  Notes that "something is not right with her vagina". Experience pain when inserting tampon and felt like "something" was in the way.  Denies difficulty breathing or respiratory distress, chest pain, abdominal pain, dyspareunia, excessive vaginal bleeding, dysuria, and leg pain or swelling.    Gynecologic History  Patient's last menstrual period was 08/26/2019 (approximate).  Contraception: Depo-Provera injections  Last Pap: 12/2017. Results were: normal  Obstetric History  OB History  Gravida Para Term Preterm AB Living  3 3 3     3   SAB TAB Ectopic Multiple Live Births        0 3    # Outcome Date GA Lbr Len/2nd Weight Sex Delivery Anes PTL Lv  3 Term 06/30/19 [redacted]w[redacted]d / 00:25 7 lb 11.5 oz (3.5 kg) M Vag-Spont EPI  LIV  2 Term 08/04/17 [redacted]w[redacted]d / 00:08 7 lb 15.3 oz (3.61 kg) F Vag-Spont EPI N LIV  1 Term 06/06/16 [redacted]w[redacted]d  9 lb (4.082 kg) M Vag-Spont EPI N LIV    Past Medical History:  Diagnosis Date  . Abdominal pain   . Anal fissure   . Asthma   . Constipation   . H/O chlamydia infection 10/30/2015  . Headache     Past Surgical History:  Procedure Laterality Date  . KNEE SURGERY Left 2014, 2015    Current Outpatient Medications on File Prior to Visit  Medication Sig Dispense Refill  . acetaminophen (TYLENOL) 500 MG tablet Take 2 tablets (1,000 mg total) by mouth every 6 (six) hours. 30 tablet 0  . albuterol (PROAIR HFA) 108 (90 Base) MCG/ACT inhaler Inhale into the lungs as needed.     . medroxyPROGESTERone (DEPO-PROVERA) 150 MG/ML injection Inject 1 mL (150 mg total) into the muscle every 3 (three) months. 1 mL 4   No current facility-administered medications on file prior to visit.    Allergies  Allergen Reactions  . Ibuprofen Nausea Only and Other (See Comments)    ibuprofen 800 mg - Vomiting and  Shaking  . Naproxen Itching and Nausea Only    Social History   Socioeconomic History  . Marital status: Significant Other    Spouse name: 09-30-1998  . Number of children: Not on file  . Years of education: Not on file  . Highest education level: Not on file  Occupational History  . Not on file  Tobacco Use  . Smoking status: Former Everlean Patterson  . Smokeless tobacco: Never Used  Vaping Use  . Vaping Use: Never used  Substance and Sexual Activity  . Alcohol use: Not Currently  . Drug use: No  . Sexual activity: Yes    Partners: Male    Birth control/protection: Injection  Other Topics Concern  . Not on file  Social History Narrative  . Not on file   Social Determinants of Health   Financial Resource Strain:   . Difficulty of Paying Living Expenses:   Food Insecurity:   . Worried About Games developer in the Last Year:   . Programme researcher, broadcasting/film/video in the Last Year:   Transportation Needs:   . Barista (Medical):   Freight forwarder Lack of Transportation (Non-Medical):   Physical Activity:   . Days of Exercise per Week:   . Minutes of Exercise per Session:   Stress:   .  Feeling of Stress :   Social Connections:   . Frequency of Communication with Friends and Family:   . Frequency of Social Gatherings with Friends and Family:   . Attends Religious Services:   . Active Member of Clubs or Organizations:   . Attends Banker Meetings:   Marland Kitchen Marital Status:   Intimate Partner Violence:   . Fear of Current or Ex-Partner:   . Emotionally Abused:   Marland Kitchen Physically Abused:   . Sexually Abused:     Family History  Problem Relation Age of Onset  . Cancer Sister   . Seizures Sister   . Migraines Mother   . Diabetes Paternal Grandmother   . Celiac disease Neg Hx   . Cholelithiasis Neg Hx   . Ulcers Neg Hx   . Breast cancer Neg Hx   . Ovarian cancer Neg Hx   . Colon cancer Neg Hx     The following portions of the patient's history were reviewed and updated as  appropriate: allergies, current medications, past family history, past medical history, past social history, past surgical history and problem list.  Review of Systems  ROS negative except as noted above. Information obtained from patient.   Objective:   BP 104/61   Pulse 73   Ht 5\' 4"  (1.626 m)   Wt 177 lb 7 oz (80.5 kg)   LMP 08/26/2019 (Approximate)   BMI 30.46 kg/m    CONSTITUTIONAL: Well-developed, well-nourished female in no acute distress.   ABDOMEN: Soft, non distended; Non tender.  No Organomegaly.  PELVIC:  External Genitalia: Normal  Vagina: Cystocele, grade 2  Cervix: Normal  Uterus: Normal size, shape,consistency, mobile  Adnexa: Normal  Bladder: Nontender  MUSCULOSKELETAL: Normal range of motion. No tenderness.  No cyanosis, clubbing, or edema.  Assessment:   1. Pelvic organ prolapse quantification stage 2 cystocele  - Ambulatory referral to Physical Therapy  2. Normal vaginal delivery of third pregnancy  - Ambulatory referral to Physical Therapy     Plan:   Exam findings discussed with patient.   Referral to pelvic floor physical therapy, see orders.   Reviewed red flag symptoms and when to call.   RTC as previously scheduled or sooner if needed.   10/26/2019, CNM [facility], [facility]

## 2019-10-16 DIAGNOSIS — Z419 Encounter for procedure for purposes other than remedying health state, unspecified: Secondary | ICD-10-CM | POA: Diagnosis not present

## 2019-10-17 ENCOUNTER — Ambulatory Visit: Payer: Medicaid Other

## 2019-10-17 NOTE — Progress Notes (Deleted)
Date last pap: 01/05/2018. Last Depo-Provera: 08/11/2019. Side Effects if any: ***. Serum HCG indicated? N/A Depo-Provera 150 mg IM given by: Shawn Route, LPN Next appointment due Oct 18-Jan 16, 2020.

## 2019-10-18 ENCOUNTER — Ambulatory Visit: Payer: BC Managed Care – PPO

## 2019-11-02 ENCOUNTER — Ambulatory Visit (INDEPENDENT_AMBULATORY_CARE_PROVIDER_SITE_OTHER): Payer: Medicaid Other | Admitting: Certified Nurse Midwife

## 2019-11-02 ENCOUNTER — Other Ambulatory Visit: Payer: Self-pay

## 2019-11-02 DIAGNOSIS — Z3042 Encounter for surveillance of injectable contraceptive: Secondary | ICD-10-CM | POA: Diagnosis not present

## 2019-11-02 MED ORDER — MEDROXYPROGESTERONE ACETATE 150 MG/ML IM SUSP
150.0000 mg | Freq: Once | INTRAMUSCULAR | Status: AC
  Administered 2019-11-02: 150 mg via INTRAMUSCULAR

## 2019-11-02 NOTE — Progress Notes (Signed)
Last depo inj: 08/11/19 UPT: N/A Side effects: none Next Depo- Provera injection due: 01/18/20-02/01/20 Annual exam due: 01/05/21

## 2019-11-03 NOTE — Progress Notes (Signed)
I have reviewed the record and concur with patient management and plan of care.    [name], CNM [facility], [address] [facility] 11/03/19 9:17 AM

## 2019-11-16 DIAGNOSIS — Z419 Encounter for procedure for purposes other than remedying health state, unspecified: Secondary | ICD-10-CM | POA: Diagnosis not present

## 2019-12-16 DIAGNOSIS — Z419 Encounter for procedure for purposes other than remedying health state, unspecified: Secondary | ICD-10-CM | POA: Diagnosis not present

## 2020-01-16 DIAGNOSIS — Z419 Encounter for procedure for purposes other than remedying health state, unspecified: Secondary | ICD-10-CM | POA: Diagnosis not present

## 2020-02-02 ENCOUNTER — Encounter: Payer: BC Managed Care – PPO | Admitting: Certified Nurse Midwife

## 2020-02-15 DIAGNOSIS — Z419 Encounter for procedure for purposes other than remedying health state, unspecified: Secondary | ICD-10-CM | POA: Diagnosis not present

## 2020-03-01 DIAGNOSIS — Z20822 Contact with and (suspected) exposure to covid-19: Secondary | ICD-10-CM | POA: Diagnosis not present

## 2020-03-01 DIAGNOSIS — U071 COVID-19: Secondary | ICD-10-CM | POA: Diagnosis not present

## 2020-03-01 DIAGNOSIS — Z03818 Encounter for observation for suspected exposure to other biological agents ruled out: Secondary | ICD-10-CM | POA: Diagnosis not present

## 2020-03-02 ENCOUNTER — Other Ambulatory Visit: Payer: BC Managed Care – PPO

## 2020-03-10 DIAGNOSIS — U071 COVID-19: Secondary | ICD-10-CM

## 2020-03-10 HISTORY — DX: COVID-19: U07.1

## 2020-03-17 DIAGNOSIS — Z419 Encounter for procedure for purposes other than remedying health state, unspecified: Secondary | ICD-10-CM | POA: Diagnosis not present

## 2020-03-22 ENCOUNTER — Ambulatory Visit: Payer: Medicaid Other | Attending: Certified Nurse Midwife | Admitting: Physical Therapy

## 2020-03-22 ENCOUNTER — Encounter: Payer: Self-pay | Admitting: Physical Therapy

## 2020-03-22 ENCOUNTER — Other Ambulatory Visit: Payer: Self-pay

## 2020-03-22 DIAGNOSIS — M533 Sacrococcygeal disorders, not elsewhere classified: Secondary | ICD-10-CM | POA: Diagnosis not present

## 2020-03-22 DIAGNOSIS — R278 Other lack of coordination: Secondary | ICD-10-CM | POA: Diagnosis not present

## 2020-03-22 DIAGNOSIS — M6281 Muscle weakness (generalized): Secondary | ICD-10-CM | POA: Diagnosis not present

## 2020-03-22 NOTE — Patient Instructions (Signed)
Stretch for tailbone      Frog stretch: laying on belly with pillow under hips, knees bent, inhale do nothing, exhale let ankles fall apart   30 reps       On belly: Riding horse edge of mattress  knee bent like riding a horse, move knee towards armpit and out  10 reps   childs poses rocking  Toes tucked, shoulders down and back, on forearms , hands shoulder width apart  10 reps    ___   Minisquat: Scoot buttocks back slight, hinge like you are looking at your reflection on a pond  Knees behind toes,  Inhale to "smell flowers"  Exhale on the rise "like rocket"  Do not lock knees, have more weight across ballmounds of feet, toes relaxed   10 reps x 3 x day   __   Proper body mechanics with getting out of a chair to decrease strain  on back &pelvic floor   Avoid holding your breath when Getting out of the chair:  Scoot to front part of chair chair Heels behind feet, feet are hip width apart, nose over toes  Inhale like you are smelling roses Exhale to stand     Avoid straining pelvic floor, abdominal muscles , spine  Use log rolling technique instead of getting out of bed with your neck or the sit-up     Log rolling into and out of bed   Log rolling into and out of bed If getting out of bed on R side, Bent knees, scoot hips/ shoulder to L  Raise R arm completely overhead, rolling onto armpit  Then lower bent knees to bed to get into complete side lying position  Then drop legs off bed, and push up onto R elbow/forearm, and use L hand to push onto the bed

## 2020-03-23 NOTE — Therapy (Signed)
[facility] MAIN [facility] SERVICES [address] [place], [place], [address] Phone: [phone]   Fax:  [phone]  Physical Therapy Evaluation  Patient Details  Name: [name] [name] MRN: [id number] Date of Birth: [birth year] Referring Provider (PT): Lawhorn-Jenkins   Encounter Date: 03/22/2020   PT End of Session - 03/23/20 1000    Visit Number 1    Number of Visits 10    Date for PT Re-Evaluation 06/01/20    Authorization Type Medicaid - reassess gaols on visit 4 for submission    PT Start Time 1709    PT Stop Time 1815    PT Time Calculation (min) 66 min           Past Medical History:  Diagnosis Date  . Abdominal pain   . Anal fissure    resolved  . Asthma   . Constipation   . COVID 03/10/2020   end of quarantine 03/10/20, HA and a little SOB/ cough . Without Post COVID Sx currently   . H/O chlamydia infection 10/30/2015  . Headache     Past Surgical History:  Procedure Laterality Date  . KNEE SURGERY Left 2014, 2015    There were no vitals filed for this visit.    Subjective Assessment - 03/22/20 1716    Subjective 1): After deliverying her 3rd child on 06/30/19, pt noticed it did not look right down there. Pt then went to her CNM about what she noticed, pt was referred to pelvic PT. Pt noticed a bulging in her vagina all day when she holds a mirror.  Denied urinary/ bowel issues, pain with sexual intercourse. She feels uncomfortable using a tampon.   2) Abdominal pain started in 2016 below belly button . It comes and goes. If she drinks dark soda , pain increases so she holds off on them. Denied interruption of sleep.  At its worst, 9-10/10.    3) LBP started in 2016 since she had kids, it worsened. Currently , pain 8/10 low back B with radiating pain down outside and front of the thigh on L ending at back of knee, radiating down R back of thigh to knee. Radiating pain occurs  randomly. By the time she walked from parking lot to  the clinic, the radiating pain BLE is present. Pt works from home and sits for 8 hours with breaks ( 2 15 min, 1- 30 min).  No fitness routine currently but wants to return to working out and lose 10-15 lbs and eating healthier.  Pt started some on sit ups, jumping jacks, squats.     Pertinent History Hx of 3 vaginal deliveries 2018, 2019, 2021, 2 perineal tears, Hx of fall onto her tailbone and it was hard to sit.              [facility] PT Assessment - 03/23/20 1017      Assessment   Medical Diagnosis Pelvic organ prolapse    Referring Provider (PT) Lawhorn-Jenkins      Precautions   Precautions None      Restrictions   Weight Bearing Restrictions No      Balance Screen   Has the patient fallen in the past 6 months No      Observation/Other Assessments   Observations ankles crossed      Coordination   Gross Motor Movements are Fluid and Coordinated --   chest breathing     Strength   Overall Strength Comments B hip abd 3/5  Palpation   Palpation comment deviated coccyx to L , tightness at coccygeus      Bed Mobility   Bed Mobility --   crunch                     Objective measurements completed on examination: See above findings.       [facility] Adult PT Treatment/Exercise - 03/23/20 1017      Therapeutic Activites    Therapeutic Activities --   explained anatomy/ physiology, body mechanics to minimize straining pelvic floor     Neuro Re-ed    Neuro Re-ed Details  cued for HEP and body mechanics      Manual Therapy   Manual therapy comments long axis distraction, LLE, rotational mob, superior glide at sacrum and lateral border of coccyx to promote coccyx ext    Kinesiotex --   promote coccyx extension                      PT Long Term Goals - 03/22/20 1731      PT LONG TERM GOAL #1   Title Pt will report decreased abdominal pain from 9-10/10 to < 5/10 across 2 weeks and less frequency in order to perform ADLs    Baseline abdominal  pain  9-10/10    Time 6    Period Weeks    Status New    Target Date 05/03/20      PT LONG TERM GOAL #2   Title Pt will be able to perform household chores without needing to sit down due to LBP for one week in order to perform household duties    Baseline Pt will be able to perform household chores with need to sit down due to LBP    Time 8    Period Weeks    Status New    Target Date 05/17/20      PT LONG TERM GOAL #3   Title Pt will demo proper pelvic floor coordination and deep core mm in sequential and circumferential pattern to minimize lowered pelvic organ position    Baseline poor coordination ( chest breathing)    Time 4    Period Weeks    Status New    Target Date 04/19/20      PT LONG TERM GOAL #4   Title Pt will demo proper body mechanics to minimize straining abdominopelvic area and optimize IAP system for pelvic stability    Baseline Poor technique with straining of pelvic floor    Time 2    Period Weeks    Status New    Target Date 04/05/20      PT LONG TERM GOAL #5   Title Pt will demo improved coccyx extension / SIJ mobility on L in order to progress to hip abbduction strengtehning exercises    Baseline flexed coccyx and hypomobile SIJ L    Time 2    Period Weeks    Status New    Target Date 04/05/20      Additional Long Term Goals   Additional Long Term Goals --      PT LONG TERM GOAL #6   Title Pt will demo increased hip abduction strength from 4-/5 B to 5/5 and improved deep core coordination/ strength in oreder to optimize IAP system for pelvic organ support and LBP    Baseline hip abduction strength from 4-/5 B    Time 8    Period Weeks  Status New    Target Date 05/31/20      PT LONG TERM GOAL #7   Title Pt will increase her LBP score on FOTO from 48 pts to > 55 pts in order to show improved function with ADLs    Baseline 49 pts    Time 10    Period Weeks    Status New    Target Date 06/01/20      PT LONG TERM GOAL #8   Title Pt's  FOTO score will decrease Prolapse score from 17pts to < 10 pts in order to promote comfort and pelvic floor function    Baseline PFDI  Prolapse 17 pts    Time 10    Period Weeks    Status New    Target Date 06/01/20      PT LONG TERM GOAL  #9   TITLE Pt will demo IND with proper alignment anf technique with fitness exercises to optimize deep core and minimize straining pelvic floor to minimize worsening of prolapse and to return to working out    Time 6    Period Weeks    Status New    Target Date 05/04/20                  Plan - 03/23/20 1002    Clinical Impression Statement  Pt is a 25  yo  who presents with prolapse Sx, abdominal pain, and LBP which impact her QOL and ADLs. Pt's musculoskeletal assessment revealed coccyx flexed and deviated to L, pelvic obliquities, dyscoordination and strength of pelvic floor mm, weak hip weakness, poor body mechanics which places strain on the abdominal/pelvic floor mm. Contributing factors:  Hx of 3 vaginal deliveries 2018, 2019, 2021 with 2 perineal tears, Hx of fall onto her tailbone which made it hard to sit, sedentary job, used to perform sit-ups and crunches.   These are deficits that indicate an ineffective intraabdominal pressure system associated with increased risk for pt's Sx. Pt will benefit from proper coordination training and education on fitness and functional positions in order to yield greater outcomes.  Advised pt to not perform sit-ups and crunches as these movement patterns lead to more downward forces on the pelvic floor, negatively impacting abdominopelvic/spinal dysfunctions.   Pt was provided education on etiology of Sx with anatomy, physiology explanation with images along with the benefits of customized pelvic PT Tx based on pt's medical conditions and musculoskeletal deficits.  Explained the physiology of deep core mm coordination and roles of pelvic floor function in urination, defecation, sexual function, and postural  control with deep core mm system.   Following Tx today which pt tolerated without complaints, pt demo'd equal alignment of pelvic girdle and increased spinal mobility.     Stability/Clinical Decision Making Evolving/Moderate complexity    Clinical Decision Making Moderate    Rehab Potential Good    PT Frequency 1x / week    PT Duration Other (comment)   10   PT Treatment/Interventions Functional mobility training;Therapeutic activities;Therapeutic exercise;Neuromuscular re-education;Patient/family education;Gait training;Balance training;Manual techniques;Moist Heat;Cryotherapy;Scar mobilization;Energy conservation;Manual lymph drainage;Stair training;Joint Manipulations;Taping;Passive range of motion;Traction;Dry needling    Consulted and Agree with Plan of Care Patient           Patient will benefit from skilled therapeutic intervention in order to improve the following deficits and impairments:  Increased muscle spasms,Hypermobility,Hypomobility,Decreased strength,Decreased range of motion,Decreased endurance,Decreased activity tolerance,Decreased safety awareness,Postural dysfunction,Improper body mechanics,Pain,Abnormal gait,Decreased coordination,Decreased mobility,Decreased scar mobility,Increased fascial restricitons  Visit Diagnosis: Sacrococcygeal disorders,  not elsewhere classified  Muscle weakness (generalized)  Other lack of coordination     Problem List Patient Active Problem List   Diagnosis Date Noted  . Pelvic organ prolapse quantification stage 2 cystocele 09/20/2019  . Marijuana user 01/17/2019  . History of postpartum depression 08/24/2016  . Constipation   . Anal fissure     Jerl Mina ,PT, DPT, [id number]  03/23/2020, 10:42 AM  [place] MAIN [facility] SERVICES [address] [place], [place], [address] Phone: [phone]   Fax:  [phone]  Name: [name] [name] MRN: [id number] Date of Birth: [birth year]

## 2020-04-05 ENCOUNTER — Ambulatory Visit: Payer: Medicaid Other | Admitting: Physical Therapy

## 2020-04-12 ENCOUNTER — Ambulatory Visit: Payer: Medicaid Other | Admitting: Physical Therapy

## 2020-04-17 DIAGNOSIS — Z419 Encounter for procedure for purposes other than remedying health state, unspecified: Secondary | ICD-10-CM | POA: Diagnosis not present

## 2020-05-11 ENCOUNTER — Other Ambulatory Visit: Payer: Self-pay

## 2020-05-11 ENCOUNTER — Encounter: Payer: Self-pay | Admitting: Certified Nurse Midwife

## 2020-05-11 ENCOUNTER — Ambulatory Visit (INDEPENDENT_AMBULATORY_CARE_PROVIDER_SITE_OTHER): Payer: Medicaid Other | Admitting: Certified Nurse Midwife

## 2020-05-11 VITALS — BP 140/80 | Ht 63.0 in | Wt 184.0 lb

## 2020-05-11 DIAGNOSIS — Z30430 Encounter for insertion of intrauterine contraceptive device: Secondary | ICD-10-CM | POA: Diagnosis not present

## 2020-05-11 DIAGNOSIS — N939 Abnormal uterine and vaginal bleeding, unspecified: Secondary | ICD-10-CM

## 2020-05-11 LAB — POCT URINE PREGNANCY: Preg Test, Ur: NEGATIVE

## 2020-05-11 NOTE — Patient Instructions (Signed)
IUD PLACEMENT POST-PROCEDURE INSTRUCTIONS  1. You may take Ibuprofen, Aleve or Tylenol for pain if needed.  Cramping should resolve within in 24 hours.  2. You may have a small amount of spotting.  You should wear a mini pad for the next few days.  3. You may have intercourse after 72  hours.  If you using this for birth control, it is effective immediately.  4. You need to call if you have any pelvic pain, fever, heavy bleeding or foul smelling vaginal discharge.  Irregular bleeding is common the first several months after having an IUD placed. You do not need to call for this reason unless you are concerned.  5. Shower or bathe as normal  6. You should have a follow-up appointment in 4-8 weeks for a re-check to make sure you are not having any problems.   Levonorgestrel intrauterine device (IUD) What is this medicine? LEVONORGESTREL IUD (LEE voe nor jes trel) is a contraceptive (birth control) device. The device is placed inside the uterus by a health care provider. It is used to prevent pregnancy. Some devices can also be used to treat heavy bleeding that occurs during your period. This medicine may be used for other purposes; ask your health care provider or pharmacist if you have questions. COMMON BRAND NAME(S): Kyleena, LILETTA, Mirena, Skyla What should I tell my health care provider before I take this medicine? They need to know if you have any of these conditions:  abnormal Pap smear  cancer of the breast, uterus, or cervix  diabetes  endometritis  genital or pelvic infection now or in the past  have more than one sexual partner or your partner has more than one partner  heart disease  history of an ectopic or tubal pregnancy  immune system problems  IUD in place  liver disease or tumor  problems with blood clots or take blood-thinners  seizures  use intravenous drugs  uterus of unusual shape  vaginal bleeding that has not been explained  an unusual or  allergic reaction to levonorgestrel, other hormones, silicone, or polyethylene, medicines, foods, dyes, or preservatives  pregnant or trying to get pregnant  breast-feeding How should I use this medicine? This device is placed inside the uterus by a health care professional. Talk to your pediatrician regarding the use of this medicine in children. Special care may be needed. Overdosage: If you think you have taken too much of this medicine contact a poison control center or emergency room at once. NOTE: This medicine is only for you. Do not share this medicine with others. What if I miss a dose? This does not apply. Depending on the brand of device you have inserted, the device will need to be replaced every 3 to 7 years if you wish to continue using this type of birth control. What may interact with this medicine? Do not take this medicine with any of the following medications:  amprenavir  bosentan  fosamprenavir This medicine may also interact with the following medications:  aprepitant  armodafinil  barbiturate medicines for inducing sleep or treating seizures  bexarotene  boceprevir  griseofulvin  medicines to treat seizures like carbamazepine, ethotoin, felbamate, oxcarbazepine, phenytoin, topiramate  modafinil  pioglitazone  rifabutin  rifampin  rifapentine  some medicines to treat HIV infection like atazanavir, efavirenz, indinavir, lopinavir, nelfinavir, tipranavir, ritonavir  St. John's wort  warfarin This list may not describe all possible interactions. Give your health care provider a list of all the medicines, herbs, non-prescription   drugs, or dietary supplements you use. Also tell them if you smoke, drink alcohol, or use illegal drugs. Some items may interact with your medicine. What should I watch for while using this medicine? Visit your doctor or health care professional for regular check ups. See your doctor if you or your partner has sexual  contact with others, becomes HIV positive, or gets a sexual transmitted disease. This product does not protect you against HIV infection (AIDS) or other sexually transmitted diseases. You can check the placement of the IUD yourself by reaching up to the top of your vagina with clean fingers to feel the threads. Do not pull on the threads. It is a good habit to check placement after each menstrual period. Call your doctor right away if you feel more of the IUD than just the threads or if you cannot feel the threads at all. The IUD may come out by itself. You may become pregnant if the device comes out. If you notice that the IUD has come out use a backup birth control method like condoms and call your health care provider. Using tampons will not change the position of the IUD and are okay to use during your period. This IUD can be safely scanned with magnetic resonance imaging (MRI) only under specific conditions. Before you have an MRI, tell your healthcare provider that you have an IUD in place, and which type of IUD you have in place. What side effects may I notice from receiving this medicine? Side effects that you should report to your doctor or health care professional as soon as possible:  allergic reactions like skin rash, itching or hives, swelling of the face, lips, or tongue  fever, flu-like symptoms  genital sores  high blood pressure  no menstrual period for 6 weeks during use  pain, swelling, warmth in the leg  pelvic pain or tenderness  severe or sudden headache  signs of pregnancy  stomach cramping  sudden shortness of breath  trouble with balance, talking, or walking  unusual vaginal bleeding, discharge  yellowing of the eyes or skin Side effects that usually do not require medical attention (report to your doctor or health care professional if they continue or are bothersome):  acne  breast pain  change in sex drive or performance  changes in  weight  cramping, dizziness, or faintness while the device is being inserted  headache  irregular menstrual bleeding within first 3 to 6 months of use  nausea This list may not describe all possible side effects. Call your doctor for medical advice about side effects. You may report side effects to FDA at 1-800-FDA-1088. Where should I keep my medicine? This does not apply. NOTE: This sheet is a summary. It may not cover all possible information. If you have questions about this medicine, talk to your doctor, pharmacist, or health care provider.  2021 Elsevier/Gold Standard (2019-11-01 16:27:45)  

## 2020-05-11 NOTE — Progress Notes (Signed)
[name] [name] is a 25 y.o. year old G1P3003 female who presents for placement of a Mirena IUD and evaluation of abnormal uterine bleeding. Last dose of Depo: 11/02/2019.  BP 140/80   Ht 5\' 3"  (1.6 m)   Wt 184 lb (83.5 kg)   LMP 05/01/2020 (Approximate)   Breastfeeding No   BMI 32.59 kg/m    Last sexual intercourse was two (2) weeks ago, and pregnancy test today was negative.  The risks and benefits of the method and placement have been thouroughly reviewed with the patient and all questions were answered.  Specifically the patient is aware of failure rate of 03/998, expulsion of the IUD and of possible perforation.  The patient is aware of irregular bleeding due to the method and understands the incidence of irregular bleeding diminishes with time.  Signed copy of informed consent in chart.   Time out was performed.  A medium plastic speculum was placed in the vagina.  The cervix was visualized, prepped using Betadine, and grasped with a single tooth tenaculum. The uterus was sounded to 10 cm.  Mirena IUD placed per manufacturer's recommendations.   The strings were trimmed to 3 cm.  The patient was given post procedure instructions, including signs and symptoms of infection and to check for the strings after each menses or each month, and refraining from intercourse or anything in the vagina for 3 days.  She was given a Mirena care card with date Mirena placed, and date Mirena to be removed.  Labs today, see orders.   Reviewed red flag symptoms and when to call.   RTC x 4-8 weeks for IUD string check or sooner if needed.    05/03/2020, CNM [facility], [facility] 05/11/20 12:29 PM    NDC0; 05/13/20 Lot: [id number] Exp: 06/2022

## 2020-05-12 LAB — CBC
Hematocrit: 37.9 % (ref 34.0–46.6)
Hemoglobin: 12.2 g/dL (ref 11.1–15.9)
MCH: 24.5 pg — ABNORMAL LOW (ref 26.6–33.0)
MCHC: 32.2 g/dL (ref 31.5–35.7)
MCV: 76 fL — ABNORMAL LOW (ref 79–97)
Platelets: 336 10*3/uL (ref 150–450)
RBC: 4.97 x10E6/uL (ref 3.77–5.28)
RDW: 15 % (ref 11.7–15.4)
WBC: 8.8 10*3/uL (ref 3.4–10.8)

## 2020-05-12 LAB — TSH: TSH: 1 u[IU]/mL (ref 0.450–4.500)

## 2020-05-12 LAB — ESTRADIOL: Estradiol: 25.3 pg/mL

## 2020-05-12 LAB — FSH/LH
FSH: 7.2 m[IU]/mL
LH: 3.4 m[IU]/mL

## 2020-05-12 LAB — FERRITIN: Ferritin: 10 ng/mL — ABNORMAL LOW (ref 15–150)

## 2020-05-15 DIAGNOSIS — Z419 Encounter for procedure for purposes other than remedying health state, unspecified: Secondary | ICD-10-CM | POA: Diagnosis not present

## 2020-06-11 ENCOUNTER — Encounter: Payer: Self-pay | Admitting: Emergency Medicine

## 2020-06-11 ENCOUNTER — Emergency Department
Admission: EM | Admit: 2020-06-11 | Discharge: 2020-06-11 | Disposition: A | Discharge status: Home / Self Care | Payer: Medicaid Other | Attending: Emergency Medicine | Admitting: Emergency Medicine

## 2020-06-11 ENCOUNTER — Other Ambulatory Visit: Payer: Self-pay

## 2020-06-11 DIAGNOSIS — K2900 Acute gastritis without bleeding: Secondary | ICD-10-CM | POA: Diagnosis not present

## 2020-06-11 DIAGNOSIS — Z8616 Personal history of COVID-19: Secondary | ICD-10-CM | POA: Diagnosis not present

## 2020-06-11 DIAGNOSIS — J45909 Unspecified asthma, uncomplicated: Secondary | ICD-10-CM | POA: Insufficient documentation

## 2020-06-11 DIAGNOSIS — R1013 Epigastric pain: Secondary | ICD-10-CM | POA: Diagnosis present

## 2020-06-11 DIAGNOSIS — Z87891 Personal history of nicotine dependence: Secondary | ICD-10-CM | POA: Insufficient documentation

## 2020-06-11 LAB — URINALYSIS, COMPLETE (UACMP) WITH MICROSCOPIC
Bacteria, UA: NONE SEEN
Bilirubin Urine: NEGATIVE
Glucose, UA: NEGATIVE mg/dL
Hgb urine dipstick: NEGATIVE
Ketones, ur: NEGATIVE mg/dL
Leukocytes,Ua: NEGATIVE
Nitrite: NEGATIVE
Protein, ur: NEGATIVE mg/dL
Specific Gravity, Urine: 1.021 (ref 1.005–1.030)
Squamous Epithelial / HPF: NONE SEEN (ref 0–5)
WBC, UA: NONE SEEN WBC/hpf (ref 0–5)
pH: 6 (ref 5.0–8.0)

## 2020-06-11 LAB — COMPREHENSIVE METABOLIC PANEL
ALT: 17 U/L (ref 0–44)
AST: 19 U/L (ref 15–41)
Albumin: 4 g/dL (ref 3.5–5.0)
Alkaline Phosphatase: 65 U/L (ref 38–126)
Anion gap: 7 (ref 5–15)
BUN: 5 mg/dL — ABNORMAL LOW (ref 6–20)
CO2: 22 mmol/L (ref 22–32)
Calcium: 8.7 mg/dL — ABNORMAL LOW (ref 8.9–10.3)
Chloride: 110 mmol/L (ref 98–111)
Creatinine, Ser: 0.69 mg/dL (ref 0.44–1.00)
GFR, Estimated: >60 mL/min (ref >60)
Glucose, Bld: 100 mg/dL — ABNORMAL HIGH (ref 70–99)
Potassium: 3.5 mmol/L (ref 3.5–5.1)
Sodium: 139 mmol/L (ref 135–145)
Total Bilirubin: 0.3 mg/dL (ref 0.3–1.2)
Total Protein: 7.6 g/dL (ref 6.5–8.1)

## 2020-06-11 LAB — CBC
HCT: 38.7 % (ref 36.0–46.0)
Hemoglobin: 12.1 g/dL (ref 12.0–15.0)
MCH: 24.2 pg — ABNORMAL LOW (ref 26.0–34.0)
MCHC: 31.3 g/dL (ref 30.0–36.0)
MCV: 77.4 fL — ABNORMAL LOW (ref 80.0–100.0)
Platelets: 302 10*3/uL (ref 150–400)
RBC: 5 MIL/uL (ref 3.87–5.11)
RDW: 15 % (ref 11.5–15.5)
WBC: 6.5 10*3/uL (ref 4.0–10.5)
nRBC: 0 % (ref 0.0–0.2)

## 2020-06-11 LAB — POC URINE PREG, ED: Preg Test, Ur: NEGATIVE

## 2020-06-11 LAB — LIPASE, BLOOD: Lipase: 22 U/L (ref 11–51)

## 2020-06-11 MED ORDER — LIDOCAINE VISCOUS HCL 2 % MT SOLN
15.0000 mL | Freq: Once | OROMUCOSAL | Status: AC
  Administered 2020-06-11: 15 mL via ORAL
  Filled 2020-06-11: qty 15

## 2020-06-11 MED ORDER — OMEPRAZOLE MAGNESIUM 20 MG PO TBEC: 20.0000 mg | DELAYED_RELEASE_TABLET | Freq: Every day | ORAL | 1 refills | Status: AC

## 2020-06-11 MED ORDER — ALUM & MAG HYDROXIDE-SIMETH 200-200-20 MG/5ML PO SUSP
15.0000 mL | Freq: Once | ORAL | Status: AC
  Administered 2020-06-11: 15 mL via ORAL
  Filled 2020-06-11: qty 30

## 2020-06-11 NOTE — ED Provider Notes (Signed)
[facility] Emergency Department Provider Note   ____________________________________________   Event Date/Time   First MD Initiated Contact with Patient 06/11/20 [phone]     (approximate)  I have reviewed the triage vital signs and the nursing notes.   HISTORY  Chief Complaint Abdominal Pain    HPI [name] [name] is a 25 y.o. female with past medical history of asthma who presents to the ED complaining of abdominal pain.  Patient reports that she has had constant pain in her abdomen for about the past 2 weeks.  She states it initially started in her lower abdomen but for the past week has been located in her epigastrium.  She describes it as a sharp pain that is worse when she goes to eat or drink.  It is been associated with nausea and she has had a couple episodes of vomiting.  She also endorses diarrhea, but denies any blood in her stool.  She has not had any dysuria or hematuria, reports irregular periods due to IUD placement, denies current vaginal bleeding or discharge.  She has never had similar symptoms in the past and denies any history of abdominal surgeries.        Past Medical History:  Diagnosis Date  . Abdominal pain   . Anal fissure    resolved  . Asthma   . Constipation   . COVID 03/10/2020   end of quarantine 03/10/20, HA and a little SOB/ cough . Without Post COVID Sx currently   . H/O chlamydia infection 10/30/2015  . Headache     Patient Active Problem List   Diagnosis Date Noted  . Pelvic organ prolapse quantification stage 2 cystocele 09/20/2019  . Marijuana user 01/17/2019  . History of postpartum depression 08/24/2016  . Constipation   . Anal fissure     Past Surgical History:  Procedure Laterality Date  . KNEE SURGERY Left 2014, 2015    Prior to Admission medications   Medication Sig Start Date End Date Taking? Authorizing Provider  omeprazole (PRILOSEC OTC) 20 MG tablet Take 1 tablet (20 mg total) by mouth daily.  06/11/20 06/11/21 Yes Chesley Noon, MD  acetaminophen (TYLENOL) 500 MG tablet Take 2 tablets (1,000 mg total) by mouth every 6 (six) hours. Patient not taking: Reported on 05/11/2020 07/01/19   [name], CNM  albuterol (VENTOLIN HFA) 108 [phone] Base) MCG/ACT inhaler Inhale into the lungs as needed.  05/07/15   [provider]  medroxyPROGESTERone (DEPO-PROVERA) 150 MG/ML injection Inject 1 mL (150 mg total) into the muscle every 3 (three) months. Patient not taking: Reported on 05/11/2020 08/11/19   [name], CNM    Allergies Ibuprofen and Naproxen  Family History  Problem Relation Age of Onset  . Cancer Sister   . Seizures Sister   . Migraines Mother   . Diabetes Paternal Grandmother   . Celiac disease Neg Hx   . Cholelithiasis Neg Hx   . Ulcers Neg Hx   . Breast cancer Neg Hx   . Ovarian cancer Neg Hx   . Colon cancer Neg Hx     Social History Social History   Tobacco Use  . Smoking status: Former Games developer  . Smokeless tobacco: Never Used  Vaping Use  . Vaping Use: Never used  Substance Use Topics  . Alcohol use: Not Currently  . Drug use: No    Review of Systems  Constitutional: No fever/chills Eyes: No visual changes. ENT: No sore throat. Cardiovascular: Denies chest  pain. Respiratory: Denies shortness of breath. Gastrointestinal: Positive for abdominal pain, nausea, vomiting, and diarrhea.  No constipation. Genitourinary: Negative for dysuria. Musculoskeletal: Negative for back pain. Skin: Negative for rash. Neurological: Negative for headaches, focal weakness or numbness.  ____________________________________________   PHYSICAL EXAM:  VITAL SIGNS: ED Triage Vitals  Enc Vitals Group     BP 06/11/20 0854 125/76     Pulse Rate 06/11/20 0854 72     Resp 06/11/20 0854 17     Temp 06/11/20 0854 98.2 F (36.8 C)     Temp Source 06/11/20 0854 Oral     SpO2 06/11/20 0854 100 %     Weight 06/11/20 0852 182 lb (82.6 kg)      Height 06/11/20 0852 5\' 3"  (1.6 m)     Head Circumference --      Peak Flow --      Pain Score 06/11/20 0852 9     Pain Loc --      Pain Edu? --      Excl. in GC? --     Constitutional: Alert and oriented. Eyes: Conjunctivae are normal. Head: Atraumatic. Nose: No congestion/rhinnorhea. Mouth/Throat: Mucous membranes are moist. Neck: Normal ROM Cardiovascular: Normal rate, regular rhythm. Grossly normal heart sounds. Respiratory: Normal respiratory effort.  No retractions. Lungs CTAB. Gastrointestinal: Soft and nontender. No distention. Genitourinary: deferred Musculoskeletal: No lower extremity tenderness nor edema. Neurologic:  Normal speech and language. No gross focal neurologic deficits are appreciated. Skin:  Skin is warm, dry and intact. No rash noted. Psychiatric: Mood and affect are normal. Speech and behavior are normal.  ____________________________________________   LABS (all labs ordered are listed, but only abnormal results are displayed)  Labs Reviewed  COMPREHENSIVE METABOLIC PANEL - Abnormal; Notable for the following components:      Result Value   Glucose, Bld 100 (*)    BUN 5 (*)    Calcium 8.7 (*)    All other components within normal limits  CBC - Abnormal; Notable for the following components:   MCV 77.4 (*)    MCH 24.2 (*)    All other components within normal limits  URINALYSIS, COMPLETE (UACMP) WITH MICROSCOPIC - Abnormal; Notable for the following components:   Color, Urine YELLOW (*)    APPearance CLEAR (*)    All other components within normal limits  LIPASE, BLOOD  POC URINE PREG, ED    PROCEDURES  Procedure(s) performed (including Critical Care):  Procedures   ____________________________________________   INITIAL IMPRESSION / ASSESSMENT AND PLAN / ED COURSE       25 year old female with past medical history of asthma who presents to the ED complaining of constant abdominal pain for the past couple of weeks, primarily in  her upper abdomen and exacerbated by eating and drinking.  She has no tenderness on exam currently and I suspect symptoms are due to gastritis versus peptic ulcer disease.  Labs are unremarkable, LFTs and lipase within normal limits.  Pregnancy testing and UA are also negative.  We will treat symptomatically with GI cocktail and reassess.  Given lack of tenderness and reassuring work-up, will hold off on CT imaging at this time.  Patient reports feeling better following GI cocktail.  She is appropriate for discharge home with PCP and GI follow-up, we will start her on omeprazole given suspicion for gastritis versus PUD.  She was counseled to return to the ED for new worsening symptoms, patient agrees with plan.      ____________________________________________   FINAL  CLINICAL IMPRESSION(S) / ED DIAGNOSES  Final diagnoses:  Acute gastritis without hemorrhage, unspecified gastritis type     ED Discharge Orders         Ordered    omeprazole (PRILOSEC OTC) 20 MG tablet  Daily        06/11/20 1143           Note:  This document was prepared using Dragon voice recognition software and may include unintentional dictation errors.   Chesley Noon, MD 06/11/20 1143

## 2020-06-11 NOTE — ED Triage Notes (Signed)
C/O abdominal pain x 2 weeks.  Initially pain toward lower abdomen, today mid abdominal pain.  States when pain not present, patient c/o nausea.  AAOx3.  Skin warm and dry. NAD

## 2020-06-11 NOTE — ED Notes (Signed)
See triage note, pt reports substernal pain x2 weeks with nausea that has prevented her from eating. Pt in NAD. Call bell within reach

## 2020-06-12 ENCOUNTER — Telehealth: Payer: Self-pay

## 2020-06-12 NOTE — Telephone Encounter (Signed)
Transition Care Management Follow-up Telephone Call  Date of discharge and from where: 06/11/2020 from [facility].   How have you been since you were released from the hospital? Pt stated that she is feeling some better and has no questions or concerns.   Any questions or concerns? No  Items Reviewed:  Did the pt receive and understand the discharge instructions provided? Yes   Medications obtained and verified? Yes   Other? No   Any new allergies since your discharge? No   Dietary orders reviewed? N/A  Do you have support at home? Yes   Functional Questionnaire: (I = Independent and D = Dependent) ADLs: I  Bathing/Dressing- I  Meal Prep- I  Eating- I  Maintaining continence- I  Transferring/Ambulation- I  Managing Meds- I   Follow up appointments reviewed:   PCP Hospital f/u appt confirmed? Yes  Scheduled to see Holly Bodily, CNM on 06/28/2020 @ 04:15pm.  Specialist Hospital f/u appt confirmed? No  Pt will call GI  Are transportation arrangements needed? No   If their condition worsens, is the pt aware to call PCP or go to the Emergency Dept.? Yes  Was the patient provided with contact information for the PCP's office or ED? Yes  Was to pt encouraged to call back with questions or concerns? Yes

## 2020-06-15 DIAGNOSIS — Z419 Encounter for procedure for purposes other than remedying health state, unspecified: Secondary | ICD-10-CM | POA: Diagnosis not present

## 2020-06-27 ENCOUNTER — Telehealth: Payer: Self-pay | Admitting: *Deleted

## 2020-06-27 NOTE — Telephone Encounter (Signed)
New Message:  I called pt to reschedule her appointment. She does not get off work till 4:30 and is needing a late appt. I explained to her that 4:15 was the last appt.  Pt stated that she would call back to r/s when she is able to leave work a little early.

## 2020-06-28 ENCOUNTER — Encounter: Payer: Medicaid Other | Admitting: Certified Nurse Midwife

## 2020-07-15 DIAGNOSIS — Z419 Encounter for procedure for purposes other than remedying health state, unspecified: Secondary | ICD-10-CM | POA: Diagnosis not present

## 2020-08-01 DIAGNOSIS — R109 Unspecified abdominal pain: Secondary | ICD-10-CM | POA: Diagnosis not present

## 2020-08-02 ENCOUNTER — Other Ambulatory Visit (HOSPITAL_COMMUNITY): Payer: Self-pay | Admitting: Gastroenterology

## 2020-08-15 DIAGNOSIS — Z419 Encounter for procedure for purposes other than remedying health state, unspecified: Secondary | ICD-10-CM | POA: Diagnosis not present

## 2020-09-14 DIAGNOSIS — Z419 Encounter for procedure for purposes other than remedying health state, unspecified: Secondary | ICD-10-CM | POA: Diagnosis not present

## 2020-09-16 ENCOUNTER — Emergency Department
Admission: EM | Admit: 2020-09-16 | Discharge: 2020-09-16 | Disposition: A | Discharge status: Home / Self Care | Payer: Medicaid Other | Attending: Emergency Medicine | Admitting: Emergency Medicine

## 2020-09-16 ENCOUNTER — Other Ambulatory Visit: Payer: Self-pay

## 2020-09-16 ENCOUNTER — Encounter: Payer: Self-pay | Admitting: Emergency Medicine

## 2020-09-16 DIAGNOSIS — J45909 Unspecified asthma, uncomplicated: Secondary | ICD-10-CM | POA: Insufficient documentation

## 2020-09-16 DIAGNOSIS — B349 Viral infection, unspecified: Secondary | ICD-10-CM

## 2020-09-16 DIAGNOSIS — Z8616 Personal history of COVID-19: Secondary | ICD-10-CM | POA: Diagnosis not present

## 2020-09-16 DIAGNOSIS — Z87891 Personal history of nicotine dependence: Secondary | ICD-10-CM | POA: Insufficient documentation

## 2020-09-16 DIAGNOSIS — R519 Headache, unspecified: Secondary | ICD-10-CM | POA: Diagnosis not present

## 2020-09-16 MED ORDER — PREDNISONE 10 MG PO TABS: 50.0000 mg | ORAL_TABLET | Freq: Every day | ORAL | 0 refills | Status: DC

## 2020-09-16 MED ORDER — ALBUTEROL SULFATE HFA 108 (90 BASE) MCG/ACT IN AERS: 2.0000 | INHALATION_SPRAY | RESPIRATORY_TRACT | 1 refills | Status: AC | PRN

## 2020-09-16 MED ORDER — PSEUDOEPH-BROMPHEN-DM 30-2-10 MG/5ML PO SYRP: 10.0000 mL | ORAL_SOLUTION | Freq: Four times a day (QID) | ORAL | 0 refills | Status: AC | PRN

## 2020-09-16 NOTE — ED Triage Notes (Signed)
Pt comes into the ED via POV c/o cough and nasal congestion. PT does have underlying asthma problems as well.  Pt currently has even and unlabored respirations and no audible wheezing in the triage room.  Pt states the cough is unproductive.

## 2020-09-16 NOTE — ED Notes (Signed)
Pt verbalizes understanding of d/c instructions, medications and follow up 

## 2020-09-16 NOTE — ED Triage Notes (Signed)
Pt reports non productive cough and congestion for the past several days. Pt reports some discomfort in her chest with cough. Pt sates had to use her inhaler around 3 this am

## 2020-09-16 NOTE — Discharge Instructions (Addendum)
Please follow up with primary care for symptoms that are not improving over the week. ° °Return to the ER for symptoms that change or worsen if unable to schedule an appointment. °

## 2020-09-16 NOTE — ED Provider Notes (Signed)
[facility] Emergency Department Provider Note  ____________________________________________  Time seen: Approximately 8:30 AM  I have reviewed the triage vital signs and the nursing notes.   HISTORY  Chief Complaint Cough and Nasal Congestion   HPI [name] [name] is a 25 y.o. female with a history of asthma presents to the ER for treatment and evaluation of headache, nonproductive cough and nasal congestion. No fever. No relief with use of albuterol, mucinex. Some relief with Excedrin Migraine.   Past Medical History:  Diagnosis Date   Abdominal pain    Anal fissure    resolved   Asthma    Constipation    COVID 03/10/2020   end of quarantine 03/10/20, HA and a little SOB/ cough . Without Post COVID Sx currently    H/O chlamydia infection 10/30/2015   Headache     Patient Active Problem List   Diagnosis Date Noted   Pelvic organ prolapse quantification stage 2 cystocele 09/20/2019   Marijuana user 01/17/2019   History of postpartum depression 08/24/2016   Constipation    Anal fissure     Past Surgical History:  Procedure Laterality Date   KNEE SURGERY Left 2014, 2015    Prior to Admission medications   Medication Sig Start Date End Date Taking? Authorizing Provider  brompheniramine-pseudoephedrine-DM 30-2-10 MG/5ML syrup Take 10 mLs by mouth 4 (four) times daily as needed. 09/16/20  Yes Auri Jahnke B, FNP  predniSONE (DELTASONE) 10 MG tablet Take 5 tablets (50 mg total) by mouth daily. 09/16/20  Yes Ailin Rochford B, FNP  acetaminophen (TYLENOL) 500 MG tablet Take 2 tablets (1,000 mg total) by mouth every 6 (six) hours. Patient not taking: Reported on 05/11/2020 07/01/19   [name], CNM  albuterol (VENTOLIN HFA) 108 (90 Base) MCG/ACT inhaler Inhale 2 puffs into the lungs every 4 (four) hours as needed for wheezing. 09/16/20   Anton Cheramie, Rulon Eisenmenger B, FNP  medroxyPROGESTERone (DEPO-PROVERA) 150 MG/ML injection Inject 1 mL (150 mg total)  into the muscle every 3 (three) months. Patient not taking: Reported on 05/11/2020 08/11/19   [name], CNM  omeprazole (PRILOSEC OTC) 20 MG tablet Take 1 tablet (20 mg total) by mouth daily. 06/11/20 06/11/21  Chesley Noon, MD    Allergies Ibuprofen and Naproxen  Family History  Problem Relation Age of Onset   Cancer Sister    Seizures Sister    Migraines Mother    Diabetes Paternal Grandmother    Celiac disease Neg Hx    Cholelithiasis Neg Hx    Ulcers Neg Hx    Breast cancer Neg Hx    Ovarian cancer Neg Hx    Colon cancer Neg Hx     Social History Social History   Tobacco Use   Smoking status: Former    Pack years: 0.00   Smokeless tobacco: Never  Vaping Use   Vaping Use: Never used  Substance Use Topics   Alcohol use: Not Currently   Drug use: No    Review of Systems Constitutional: No fever/chills. Normal appetite. ENT: No sore throat. Cardiovascular: Chest pain with cough Respiratory: No shortness of breath. Positive for cough. Positive for wheezing.  Gastrointestinal: No nausea,  no vomiting.  no diarrhea.  Musculoskeletal: Negative for body aches Skin: Negative for rash. Neurological: Positive for headaches ____________________________________________   PHYSICAL EXAM:  VITAL SIGNS: ED Triage Vitals  Enc Vitals Group     BP 09/16/20 0819 139/85     Pulse Rate 09/16/20 0819 93  Resp 09/16/20 0819 17     Temp 09/16/20 0819 98.4 F (36.9 C)     Temp Source 09/16/20 0819 Oral     SpO2 09/16/20 0819 100 %     Weight 09/16/20 0817 180 lb (81.6 kg)     Height 09/16/20 0817 5\' 3"  (1.6 m)     Head Circumference --      Peak Flow --      Pain Score 09/16/20 0817 8     Pain Loc --      Pain Edu? --      Excl. in [place]? --     Constitutional: Alert and oriented. Overall well appearing and in no acute distress. Eyes: Conjunctivae are normal. Ears: Bilateral TM normal Nose: No sinus congestion noted; no rhinnorhea. Mouth/Throat:  Mucous membranes are moist.  Oropharynx clear. Tonsils not visualized. Uvula midline. Neck: No stridor.  Lymphatic: No cervical lymphadenopathy. Cardiovascular: Normal rate, regular rhythm. Good peripheral circulation. Respiratory: Respirations are even and unlabored.  No retractions. Breath sounds clear to auscultation. Gastrointestinal: Soft and nontender.  Musculoskeletal: FROM x 4 extremities.  Neurologic:  Normal speech and language. Skin:  Skin is warm, dry and intact. No rash noted. Psychiatric: Mood and affect are normal. Speech and behavior are normal.  ____________________________________________   LABS (all labs ordered are listed, but only abnormal results are displayed)  Labs Reviewed  SARS CORONAVIRUS 2 (TAT 6-24 HRS)   ____________________________________________  EKG  Not indicated. ____________________________________________  RADIOLOGY  Not indicated. ____________________________________________   PROCEDURES  Procedure(s) performed: None  Critical Care performed: No ____________________________________________   INITIAL IMPRESSION / ASSESSMENT AND PLAN / ED COURSE  25 y.o. female who presents to the emergency department for treatment and evaluation of cough and nasal congestion.  See HPI for further details.  Exam is reassuring.  Vital signs are stable.  Plan will be to treat her with prednisone and Bromfed and test her for COVID.  Work note provided.  She is to follow-up with her primary care provider or return to the emergency department for symptoms that change or worsen.  Medications - No data to display  ED Discharge Orders          Ordered    predniSONE (DELTASONE) 10 MG tablet  Daily        09/16/20 0838    brompheniramine-pseudoephedrine-DM 30-2-10 MG/5ML syrup  4 times daily PRN        09/16/20 0838    albuterol (VENTOLIN HFA) 108 (90 Base) MCG/ACT inhaler  Every 4 hours PRN        09/16/20 0841             Pertinent labs &  imaging results that were available during my care of the patient were reviewed by me and considered in my medical decision making (see chart for details).    If controlled substance prescribed during this visit, 12 month history viewed on the [facility] prior to issuing an initial prescription for Schedule II or III opiod. ____________________________________________   FINAL CLINICAL IMPRESSION(S) / ED DIAGNOSES  Final diagnoses:  Acute viral syndrome    Note:  This document was prepared using Dragon voice recognition software and may include unintentional dictation errors.     11/17/20, FNP 09/16/20 1311    11/17/20, MD 09/17/20 1314

## 2020-09-18 ENCOUNTER — Telehealth: Payer: Self-pay

## 2020-09-18 NOTE — Telephone Encounter (Signed)
Transition Care Management Follow-up Telephone Call Date of discharge and from where: 09/16/2020 from [facility] How have you been since you were released from the hospital? Pt stated that she is feeling better and has no questions or concerns at this time. Any questions or concerns? No  Items Reviewed: Did the pt receive and understand the discharge instructions provided? Yes  Medications obtained and verified? No  Other? No  Any new allergies since your discharge? No  Dietary orders reviewed? No Do you have support at home? Yes   Functional Questionnaire: (I = Independent and D = Dependent) ADLs: I  Bathing/Dressing- I  Meal Prep- I  Eating- I  Maintaining continence- I  Transferring/Ambulation- I  Managing Meds- I   Follow up appointments reviewed:  PCP Hospital f/u appt confirmed? No   Specialist Hospital f/u appt confirmed? No  . Are transportation arrangements needed? No  If their condition worsens, is the pt aware to call PCP or go to the Emergency Dept.? Yes Was the patient provided with contact information for the PCP's office or ED? Yes Was to pt encouraged to call back with questions or concerns? Yes

## 2020-10-15 DIAGNOSIS — Z419 Encounter for procedure for purposes other than remedying health state, unspecified: Secondary | ICD-10-CM | POA: Diagnosis not present

## 2020-11-15 DIAGNOSIS — Z419 Encounter for procedure for purposes other than remedying health state, unspecified: Secondary | ICD-10-CM | POA: Diagnosis not present

## 2020-12-15 DIAGNOSIS — Z419 Encounter for procedure for purposes other than remedying health state, unspecified: Secondary | ICD-10-CM | POA: Diagnosis not present

## 2020-12-22 IMAGING — CT CT HEAD WITHOUT CONTRAST
5 of 7 series · 15 of 47 positions shown, 16 images · non-contrast
Comparison: None.

CLINICAL DATA: Pain following trauma

EXAM:
CT HEAD WITHOUT CONTRAST
CT CERVICAL SPINE WITHOUT CONTRAST
TECHNIQUE: Multidetector CT imaging of the head and cervical spine was
performed following the standard protocol without intravenous
contrast. Multiplanar CT image reconstructions of the cervical spine
were also generated.

[Series 2: head wo · axial · 0.39mm/px · z∈[+317,+362]mm · 2 of 27 slices shown, 3 images]
[im 9/27  brain]
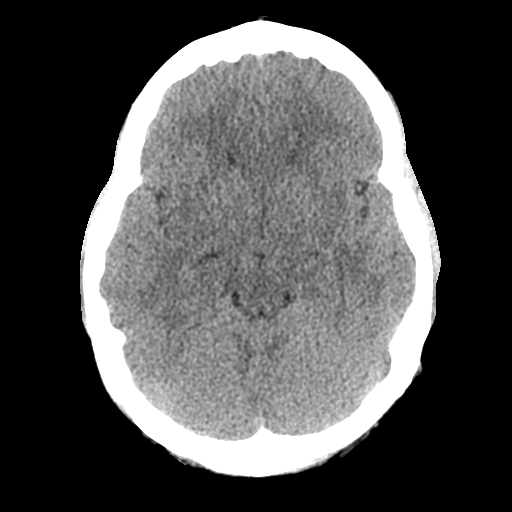
[im 9/27  bone]
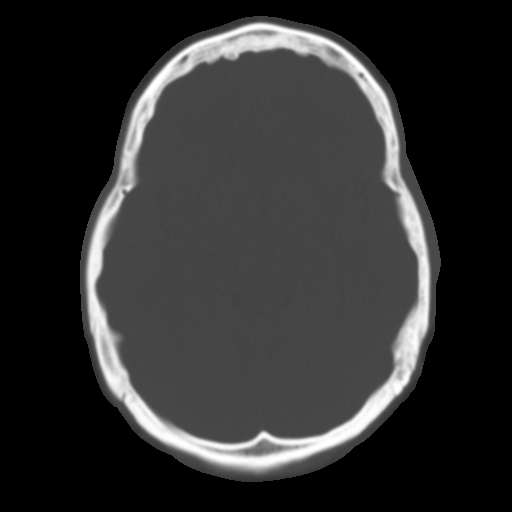
[im 18/27  brain]
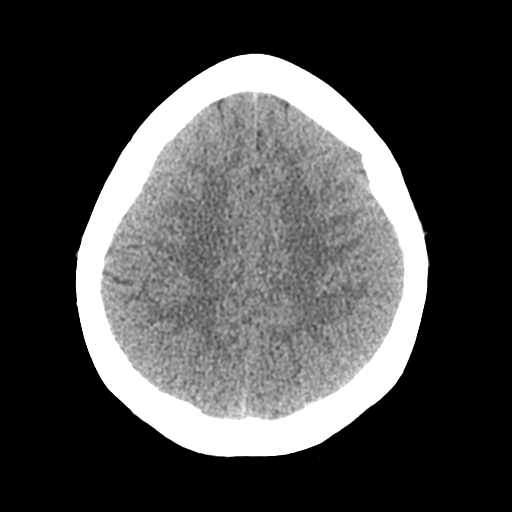

[Series 5: coronal soft tissue · coronal · 0.27mm/px · 3 of 63 slices shown]
[im 13/63  brain]
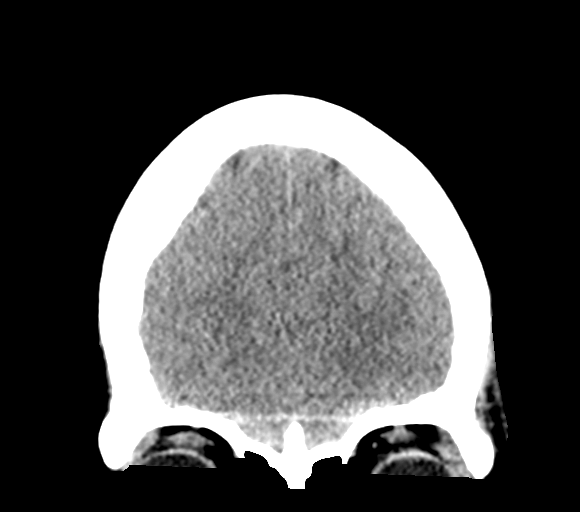
[im 25/63  brain]
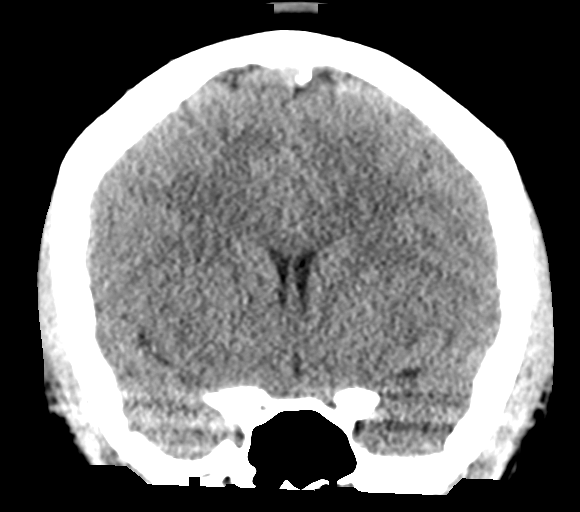
[im 38/63  brain]
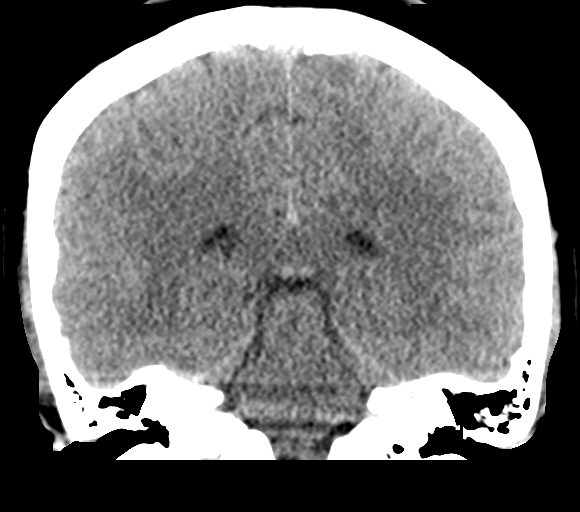

[Series 6: sagittal soft tissue · sagittal · 0.30mm/px · 1 of 56 slices shown]
[im 28/56  brain]
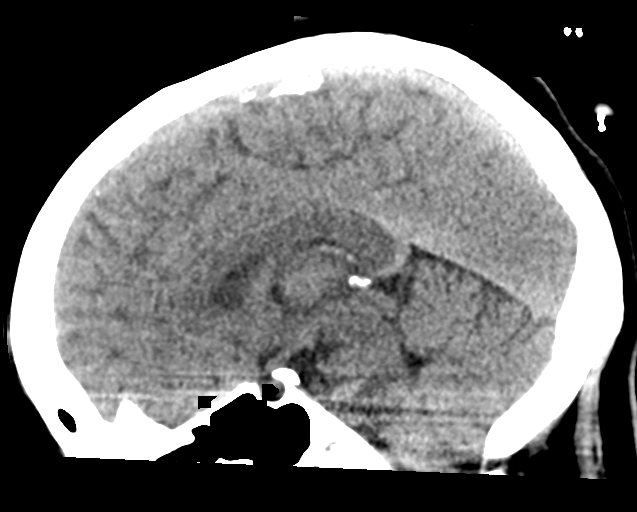

[Series 7: c spine soft · axial · 0.25mm/px · 1 of 81 slices shown]
[im 8/81  brain]
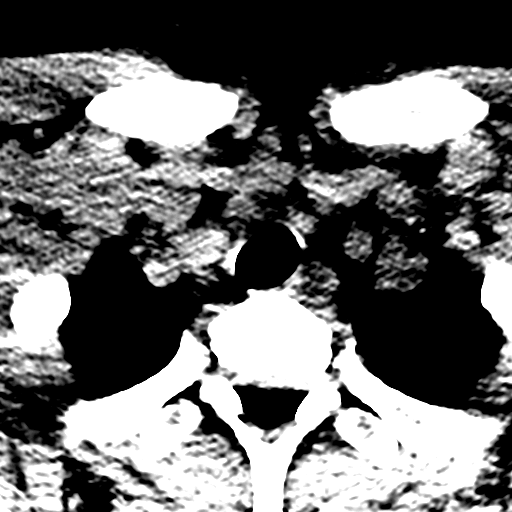

[Series 12: orthogonal bone · axial · 0.29mm/px · z∈[+110,+251]mm · 8 of 95 slices shown]
[im 8/95  bone]
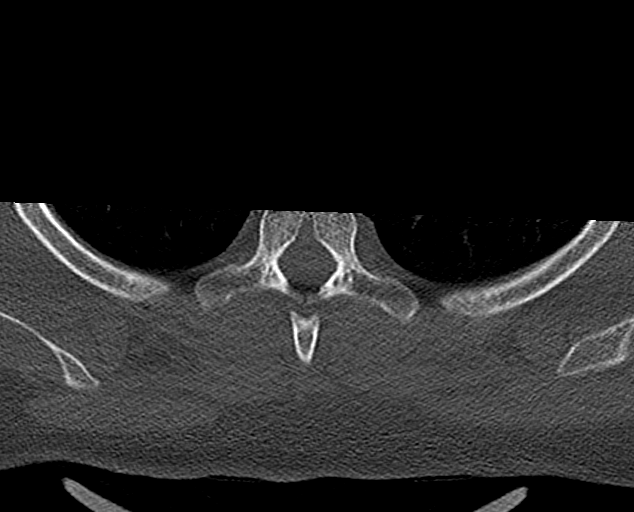
[im 22/95  bone]
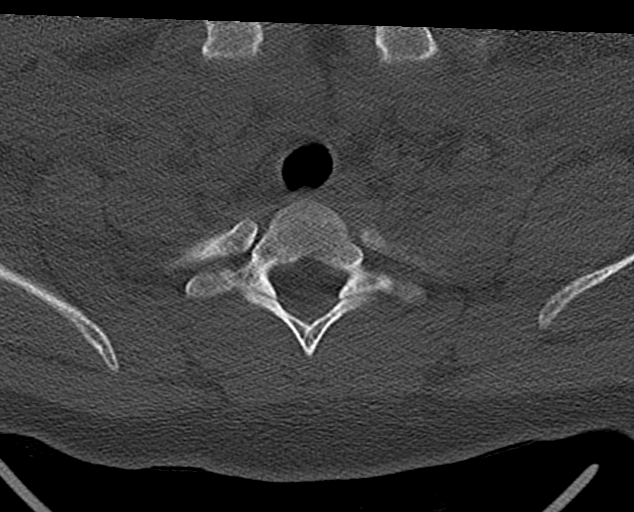
[im 29/95  bone]
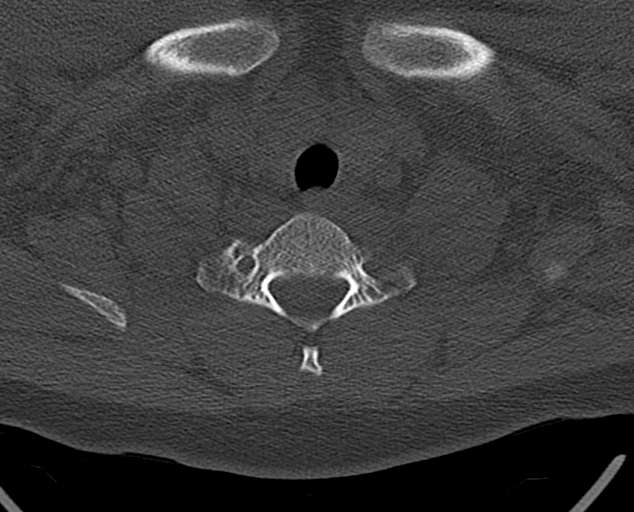
[im 44/95  bone]
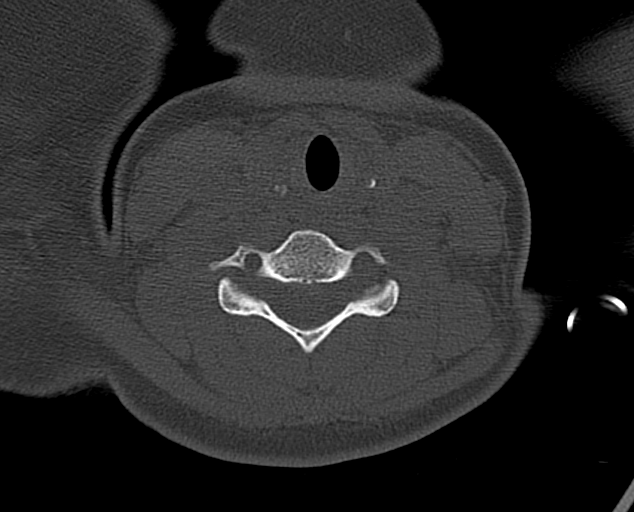
[im 51/95  bone]
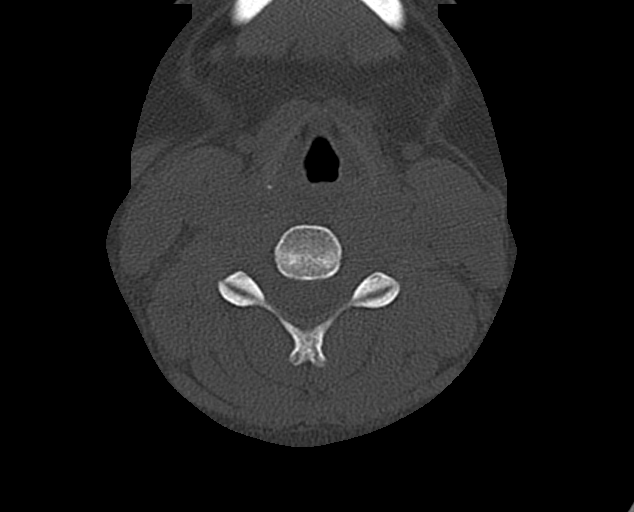
[im 66/95  bone]
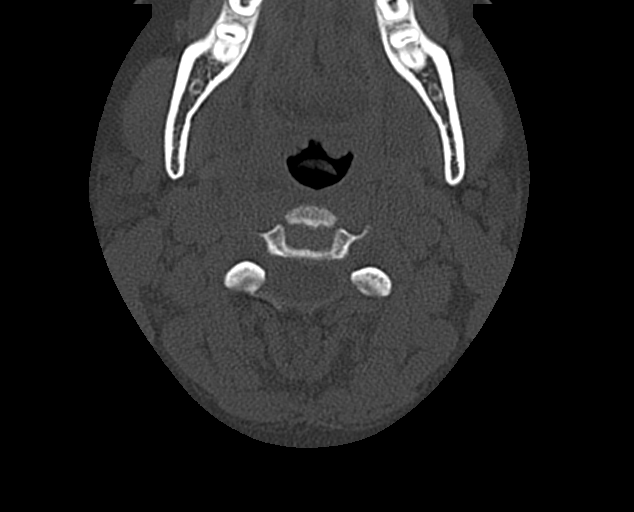
[im 73/95  bone]
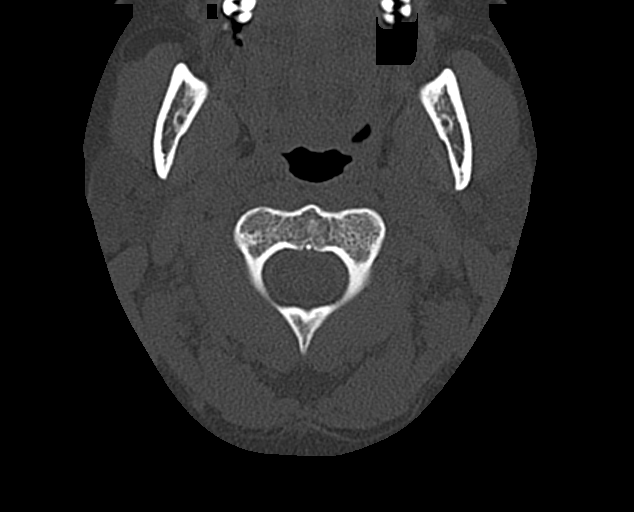
[im 87/95  bone]
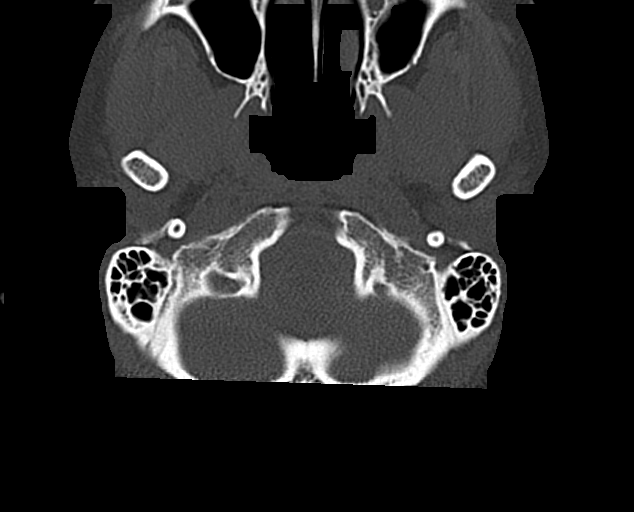

[15 of 47 positions shown; findings below may reference images not displayed]

FINDINGS: CT HEAD FINDINGS

Brain: The ventricles are normal in size and configuration. There is
no intracranial mass, hemorrhage, extra-axial fluid collection, or
midline shift. The brain parenchyma appears unremarkable. No acute
infarct is evident.

Vascular: There is no hyperdense vessel. There is no evident
vascular calcification.

Skull: The bony calvarium appears intact.

Sinuses/Orbits: There is mild mucosal thickening in visualized
ethmoid air cells on the left. Other visualized paranasal sinuses
are clear. Visualized orbits appear symmetric bilaterally.

Other: Mastoid air cells are clear.

CT CERVICAL SPINE FINDINGS

Alignment: There is no demonstrable spondylolisthesis.

Skull base and vertebrae: Skull base and craniocervical junction
regions appear normal. No evident fracture. No blastic or lytic bone
lesions.

Soft tissues and spinal canal: Prevertebral soft tissues and
predental space regions are normal. No cord or canal hematoma
evident. No paraspinous lesions.

Disc levels: Disc spaces appear normal. No nerve root edema or
effacement. No disc extrusion or stenosis. No appreciable facet
arthropathy.

Upper chest: Visualized upper lung regions are clear.

Other: None
IMPRESSION: CT head: Slight left ethmoid sinus disease. Study otherwise
unremarkable.

CT cervical spine: No fracture or spondylolisthesis. No appreciable
arthropathy. No nerve root edema or effacement. No disc extrusion or
stenosis.

## 2021-01-07 DIAGNOSIS — R591 Generalized enlarged lymph nodes: Secondary | ICD-10-CM | POA: Diagnosis not present

## 2021-01-15 DIAGNOSIS — Z419 Encounter for procedure for purposes other than remedying health state, unspecified: Secondary | ICD-10-CM | POA: Diagnosis not present

## 2021-02-14 DIAGNOSIS — Z419 Encounter for procedure for purposes other than remedying health state, unspecified: Secondary | ICD-10-CM | POA: Diagnosis not present

## 2021-03-03 ENCOUNTER — Encounter: Payer: Self-pay | Admitting: Emergency Medicine

## 2021-03-03 ENCOUNTER — Ambulatory Visit
Admission: EM | Admit: 2021-03-03 | Discharge: 2021-03-03 | Disposition: A | Discharge status: Home / Self Care | Payer: Medicaid Other | Attending: Emergency Medicine | Admitting: Emergency Medicine

## 2021-03-03 DIAGNOSIS — L03213 Periorbital cellulitis: Secondary | ICD-10-CM

## 2021-03-03 MED ORDER — AMOXICILLIN-POT CLAVULANATE 875-125 MG PO TABS: 1.0000 | ORAL_TABLET | Freq: Two times a day (BID) | ORAL | 0 refills | Status: AC

## 2021-03-03 NOTE — ED Provider Notes (Signed)
[name] [name]    CSN: [id number] Arrival date & time: 03/03/21  1118      History   Chief Complaint Chief Complaint  Patient presents with   Eye Problem    HPI [name] [name] is a 25 y.o. female.  Patient presents with 2-day history of right eye redness and swelling.  She reports mild eye drainage from both eyes this morning.  No trauma.  She also reports mild sore throat, No eye pain, changes in vision, eye itching, fever, cough, shortness of breath, or other symptoms.  No treatment attempted at home.    The history is provided by the patient and medical records.   Past Medical History:  Diagnosis Date   Abdominal pain    Anal fissure    resolved   Asthma    Constipation    COVID 03/10/2020   end of quarantine 03/10/20, HA and a little SOB/ cough . Without Post COVID Sx currently    H/O chlamydia infection 10/30/2015   Headache     Patient Active Problem List   Diagnosis Date Noted   Pelvic organ prolapse quantification stage 2 cystocele 09/20/2019   Marijuana user 01/17/2019   History of postpartum depression 08/24/2016   Constipation    Anal fissure     Past Surgical History:  Procedure Laterality Date   KNEE SURGERY Left 2014, 2015    OB History     Gravida  3   Para  3   Term  3   Preterm      AB      Living  3      SAB      IAB      Ectopic      Multiple  0   Live Births  3            Home Medications    Prior to Admission medications   Medication Sig Start Date End Date Taking? Authorizing Provider  amoxicillin-clavulanate (AUGMENTIN) 875-125 MG tablet Take 1 tablet by mouth every 12 (twelve) hours. 03/03/21  Yes Sharion Balloon, NP  acetaminophen (TYLENOL) 500 MG tablet Take 2 tablets (1,000 mg total) by mouth every 6 (six) hours. Patient not taking: Reported on 05/11/2020 07/01/19   Diona Fanti, CNM  albuterol (VENTOLIN HFA) 108 (90 Base) MCG/ACT inhaler Inhale 2 puffs into the lungs every 4 (four) hours  as needed for wheezing. 09/16/20   Triplett, Johnette Abraham B, FNP  brompheniramine-pseudoephedrine-DM 30-2-10 MG/5ML syrup Take 10 mLs by mouth 4 (four) times daily as needed. 09/16/20   Triplett, Johnette Abraham B, FNP  medroxyPROGESTERone (DEPO-PROVERA) 150 MG/ML injection Inject 1 mL (150 mg total) into the muscle every 3 (three) months. Patient not taking: Reported on 05/11/2020 08/11/19   Diona Fanti, CNM  omeprazole (PRILOSEC OTC) 20 MG tablet Take 1 tablet (20 mg total) by mouth daily. 06/11/20 06/11/21  Blake Divine, MD  predniSONE (DELTASONE) 10 MG tablet Take 5 tablets (50 mg total) by mouth daily. 09/16/20   Victorino Dike, FNP    Family History Family History  Problem Relation Age of Onset   Cancer Sister    Seizures Sister    Migraines Mother    Diabetes Paternal Grandmother    Celiac disease Neg Hx    Cholelithiasis Neg Hx    Ulcers Neg Hx    Breast cancer Neg Hx    Ovarian cancer Neg Hx    Colon cancer Neg Hx     Social  History Social History   Tobacco Use   Smoking status: Former   Smokeless tobacco: Never  Scientific laboratory technician Use: Never used  Substance Use Topics   Alcohol use: Not Currently   Drug use: No     Allergies   Ibuprofen and Naproxen   Review of Systems Review of Systems  Constitutional:  Negative for chills and fever.  HENT:  Positive for sore throat. Negative for ear pain.   Eyes:  Positive for discharge and redness. Negative for pain, itching and visual disturbance.  Respiratory:  Negative for cough and shortness of breath.   Cardiovascular:  Negative for chest pain and palpitations.  Gastrointestinal:  Negative for diarrhea and vomiting.  Skin:  Negative for color change and rash.  All other systems reviewed and are negative.   Physical Exam Triage Vital Signs ED Triage Vitals  Enc Vitals Group     BP 03/03/21 1245 132/82     Pulse Rate 03/03/21 1245 77     Resp 03/03/21 1245 18     Temp 03/03/21 1245 98 F (36.7 C)     Temp Source  03/03/21 1245 Oral     SpO2 03/03/21 1245 98 %     Weight --      Height --      Head Circumference --      Peak Flow --      Pain Score 03/03/21 1252 3     Pain Loc --      Pain Edu? --      Excl. in [place]? --    No data found.  Updated Vital Signs BP 132/82 (BP Location: Left Arm)    Pulse 77    Temp 98 F (36.7 C) (Oral)    Resp 18    SpO2 98%   Visual Acuity Right Eye Distance: 20/25 Left Eye Distance: 20/20 Bilateral Distance: 20/20  Right Eye Near:   Left Eye Near:    Bilateral Near:     Physical Exam Vitals and nursing note reviewed.  Constitutional:      General: She is not in acute distress.    Appearance: She is well-developed.  HENT:     Right Ear: Tympanic membrane normal.     Left Ear: Tympanic membrane normal.     Nose: Nose normal.     Mouth/Throat:     Mouth: Mucous membranes are moist.     Pharynx: Oropharynx is clear.  Eyes:     Extraocular Movements: Extraocular movements intact.     Conjunctiva/sclera:     Right eye: Right conjunctiva is injected.     Pupils: Pupils are equal, round, and reactive to light.     Comments: Right eyelids erythematous with mild edema.  Right conjunctiva injected.  No eye drainage.   Cardiovascular:     Rate and Rhythm: Normal rate and regular rhythm.     Heart sounds: Normal heart sounds.  Pulmonary:     Effort: Pulmonary effort is normal. No respiratory distress.     Breath sounds: Normal breath sounds.  Musculoskeletal:     Cervical back: Neck supple.  Skin:    General: Skin is warm and dry.  Neurological:     Mental Status: She is alert.  Psychiatric:        Mood and Affect: Mood normal.        Behavior: Behavior normal.     UC Treatments / Results  Labs (all labs ordered are listed, but only abnormal  results are displayed) Labs Reviewed - No data to display  EKG   Radiology No results found.  Procedures Procedures (including critical care time)  Medications Ordered in UC Medications - No  data to display  Initial Impression / Assessment and Plan / UC Course  I have reviewed the triage vital signs and the nursing notes.  Pertinent labs & imaging results that were available during my care of the patient were reviewed by me and considered in my medical decision making (see chart for details).   Right eye preseptal cellulitis.  Treating with Augmentin.  Education provided on preseptal cellulitis.  Instructed patient to follow-up with her eye care provider if her symptoms are not improving.  ED precautions discussed.  Patient agrees to plan of care.   Final Clinical Impressions(s) / UC Diagnoses   Final diagnoses:  Preseptal cellulitis of right eye     Discharge Instructions      Take the antibiotic as prescribed.    Follow-up with your eye doctor for a recheck in 1 to 2 days if your symptoms are not improving.    Go to the emergency department if you have acute eye pain, changes in your vision, or other concerning symptoms.        ED Prescriptions     Medication Sig Dispense Auth. Provider   amoxicillin-clavulanate (AUGMENTIN) 875-125 MG tablet Take 1 tablet by mouth every 12 (twelve) hours. 14 tablet Mickie Bail, NP      PDMP not reviewed this encounter.   Mickie Bail, NP 03/03/21 1327

## 2021-03-03 NOTE — ED Triage Notes (Signed)
Pt here with swollen, red, and irritated right eye x 2 days. Endorses drainage and denies blurred vision.

## 2021-03-03 NOTE — Discharge Instructions (Addendum)
Take the antibiotic as prescribed.    Follow-up with your eye doctor for a recheck in 1 to 2 days if your symptoms are not improving.    Go to the emergency department if you have acute eye pain, changes in your vision, or other concerning symptoms.

## 2021-03-12 ENCOUNTER — Ambulatory Visit: Payer: Medicaid Other | Admitting: Adult Health

## 2021-03-17 DIAGNOSIS — Z419 Encounter for procedure for purposes other than remedying health state, unspecified: Secondary | ICD-10-CM | POA: Diagnosis not present

## 2021-03-27 ENCOUNTER — Ambulatory Visit: Payer: Medicaid Other | Admitting: Adult Health

## 2021-04-17 DIAGNOSIS — Z419 Encounter for procedure for purposes other than remedying health state, unspecified: Secondary | ICD-10-CM | POA: Diagnosis not present

## 2021-05-15 DIAGNOSIS — Z419 Encounter for procedure for purposes other than remedying health state, unspecified: Secondary | ICD-10-CM | POA: Diagnosis not present

## 2021-06-15 DIAGNOSIS — Z419 Encounter for procedure for purposes other than remedying health state, unspecified: Secondary | ICD-10-CM | POA: Diagnosis not present

## 2021-07-15 DIAGNOSIS — Z419 Encounter for procedure for purposes other than remedying health state, unspecified: Secondary | ICD-10-CM | POA: Diagnosis not present

## 2021-08-15 DIAGNOSIS — Z419 Encounter for procedure for purposes other than remedying health state, unspecified: Secondary | ICD-10-CM | POA: Diagnosis not present

## 2021-09-14 DIAGNOSIS — Z419 Encounter for procedure for purposes other than remedying health state, unspecified: Secondary | ICD-10-CM | POA: Diagnosis not present

## 2021-09-24 ENCOUNTER — Ambulatory Visit
Admission: RE | Admit: 2021-09-24 | Discharge: 2021-09-24 | Disposition: A | Discharge status: Home / Self Care | Payer: Medicaid Other | Source: Ambulatory Visit | Attending: Emergency Medicine | Admitting: Emergency Medicine

## 2021-09-24 VITALS — BP 121/81 | HR 75 | Temp 97.7°F | Resp 18

## 2021-09-24 DIAGNOSIS — M25531 Pain in right wrist: Secondary | ICD-10-CM | POA: Diagnosis not present

## 2021-09-24 MED ORDER — PREDNISONE 10 MG (21) PO TBPK: ORAL_TABLET | Freq: Every day | ORAL | 0 refills | Status: AC

## 2021-09-24 NOTE — Discharge Instructions (Addendum)
Take the prednisone as prescribed.  Rest and elevate your wrist.  Apply ice packs 2-3 times a day for up to 20 minutes each.  Wear the wrist splint as needed for comfort.  Follow up with an orthopedist.

## 2021-09-24 NOTE — ED Triage Notes (Signed)
Patient presents to Urgent Care with complaints of wrist pain x 1 week. States this happened a month ago as well. Job requires a lot of typing. Has been using a wrist brace for support.

## 2021-09-24 NOTE — ED Provider Notes (Signed)
Renaldo Fiddler    CSN: [id number] Arrival date & time: 09/24/21  1058      History   Chief Complaint Chief Complaint  Patient presents with   Wrist Pain    Entered by patient    HPI [name] [name] is a 26 y.o. female.  Patient presents with pain in her right wrist x1 week.  The pain is worse with movement of her wrist.  No falls or injury.  She denies numbness, weakness, paresthesias, wounds, redness, bruising, rash, or other symptoms.  She has been wearing a wrist splint and taking Tylenol.  She attributes her symptoms to typing at work.  She had similar episode of right wrist pain about a month ago which resolved spontaneously.  Patient is allergic to NSAIDs. She denies current pregnancy or breastfeeding.     The history is provided by the patient and medical records.    Past Medical History:  Diagnosis Date   Abdominal pain    Anal fissure    resolved   Asthma    Constipation    COVID 03/10/2020   end of quarantine 03/10/20, HA and a little SOB/ cough . Without Post COVID Sx currently    H/O chlamydia infection 10/30/2015   Headache     Patient Active Problem List   Diagnosis Date Noted   Pelvic organ prolapse quantification stage 2 cystocele 09/20/2019   Marijuana user 01/17/2019   History of postpartum depression 08/24/2016   Constipation    Anal fissure     Past Surgical History:  Procedure Laterality Date   KNEE SURGERY Left 2014, 2015    OB History     Gravida  3   Para  3   Term  3   Preterm      AB      Living  3      SAB      IAB      Ectopic      Multiple  0   Live Births  3            Home Medications    Prior to Admission medications   Medication Sig Start Date End Date Taking? Authorizing Provider  predniSONE (STERAPRED UNI-PAK 21 TAB) 10 MG (21) TBPK tablet Take by mouth daily. As directed 09/24/21  Yes Mickie Bail, NP  acetaminophen (TYLENOL) 500 MG tablet Take 2 tablets (1,000 mg total) by mouth every 6  (six) hours. Patient not taking: Reported on 05/11/2020 07/01/19   [name], CNM  albuterol (VENTOLIN HFA) 108 (90 Base) MCG/ACT inhaler Inhale 2 puffs into the lungs every 4 (four) hours as needed for wheezing. 09/16/20   Triplett, Rulon Eisenmenger B, FNP  amoxicillin-clavulanate (AUGMENTIN) 875-125 MG tablet Take 1 tablet by mouth every 12 (twelve) hours. 03/03/21   Mickie Bail, NP  brompheniramine-pseudoephedrine-DM 30-2-10 MG/5ML syrup Take 10 mLs by mouth 4 (four) times daily as needed. 09/16/20   Triplett, Rulon Eisenmenger B, FNP  medroxyPROGESTERone (DEPO-PROVERA) 150 MG/ML injection Inject 1 mL (150 mg total) into the muscle every 3 (three) months. Patient not taking: Reported on 05/11/2020 08/11/19   [name], CNM  omeprazole (PRILOSEC OTC) 20 MG tablet Take 1 tablet (20 mg total) by mouth daily. 06/11/20 06/11/21  Chesley Noon, MD    Family History Family History  Problem Relation Age of Onset   Cancer Sister    Seizures Sister    Migraines Mother    Diabetes Paternal Grandmother  Celiac disease Neg Hx    Cholelithiasis Neg Hx    Ulcers Neg Hx    Breast cancer Neg Hx    Ovarian cancer Neg Hx    Colon cancer Neg Hx     Social History Social History   Tobacco Use   Smoking status: Former   Smokeless tobacco: Never  Building services engineer Use: Never used  Substance Use Topics   Alcohol use: Not Currently   Drug use: No     Allergies   Ibuprofen and Naproxen   Review of Systems Review of Systems  Constitutional:  Negative for chills and fever.  Musculoskeletal:  Positive for arthralgias. Negative for joint swelling.  Skin:  Negative for color change, rash and wound.  Neurological:  Negative for weakness and numbness.  All other systems reviewed and are negative.    Physical Exam Triage Vital Signs ED Triage Vitals [09/24/21 1124]  Enc Vitals Group     BP 121/81     Pulse Rate 75     Resp 18     Temp 97.7 F (36.5 C)     Temp src      SpO2 98 %      Weight      Height      Head Circumference      Peak Flow      Pain Score      Pain Loc      Pain Edu?      Excl. in GC?    No data found.  Updated Vital Signs BP 121/81   Pulse 75   Temp 97.7 F (36.5 C)   Resp 18   LMP 08/29/2021   SpO2 98%   Visual Acuity Right Eye Distance:   Left Eye Distance:   Bilateral Distance:    Right Eye Near:   Left Eye Near:    Bilateral Near:     Physical Exam Vitals and nursing note reviewed.  Constitutional:      General: She is not in acute distress.    Appearance: Normal appearance. She is well-developed. She is not ill-appearing.  HENT:     Mouth/Throat:     Mouth: Mucous membranes are moist.  Cardiovascular:     Rate and Rhythm: Normal rate and regular rhythm.     Heart sounds: Normal heart sounds.  Pulmonary:     Effort: Pulmonary effort is normal. No respiratory distress.     Breath sounds: Normal breath sounds.  Musculoskeletal:        General: No swelling, tenderness or deformity. Normal range of motion.     Cervical back: Neck supple.     Comments: Right wrist nontender to palpation.  FROM, sensation intact, 2+ pulse, strength 5/5.  Skin:    General: Skin is warm and dry.     Capillary Refill: Capillary refill takes less than 2 seconds.     Findings: No bruising, erythema, lesion or rash.  Neurological:     General: No focal deficit present.     Mental Status: She is alert and oriented to person, place, and time.     Sensory: No sensory deficit.     Motor: No weakness.  Psychiatric:        Mood and Affect: Mood normal.        Behavior: Behavior normal.      UC Treatments / Results  Labs (all labs ordered are listed, but only abnormal results are displayed) Labs Reviewed - No data to  display  EKG   Radiology No results found.  Procedures Procedures (including critical care time)  Medications Ordered in UC Medications - No data to display  Initial Impression / Assessment and Plan / UC Course   I have reviewed the triage vital signs and the nursing notes.  Pertinent labs & imaging results that were available during my care of the patient were reviewed by me and considered in my medical decision making (see chart for details).  Right wrist pain.  No falls or injury.  Patient is wearing a wrist splint.  She is allergic to NSAIDs.  Treating today with prednisone.  Discussed rest, elevation, ice packs, continued use of the splint.  Instructed patient to follow-up in orthopedics.  Contact information for on-call Ortho provided.  Work note provided for today.  Patient agrees to plan of care.   Final Clinical Impressions(s) / UC Diagnoses   Final diagnoses:  Right wrist pain     Discharge Instructions      Take the prednisone as prescribed.  Rest and elevate your wrist.  Apply ice packs 2-3 times a day for up to 20 minutes each.  Wear the wrist splint as needed for comfort.  Follow up with an orthopedist.        ED Prescriptions     Medication Sig Dispense Auth. Provider   predniSONE (STERAPRED UNI-PAK 21 TAB) 10 MG (21) TBPK tablet Take by mouth daily. As directed 21 tablet Mickie Bail, NP      PDMP not reviewed this encounter.   Mickie Bail, NP 09/24/21 [phone]

## 2021-10-15 DIAGNOSIS — Z419 Encounter for procedure for purposes other than remedying health state, unspecified: Secondary | ICD-10-CM | POA: Diagnosis not present

## 2021-10-16 ENCOUNTER — Ambulatory Visit: Payer: Medicaid Other | Admitting: Nurse Practitioner

## 2021-11-14 ENCOUNTER — Ambulatory Visit: Payer: Medicaid Other | Admitting: Nurse Practitioner

## 2021-11-15 DIAGNOSIS — Z419 Encounter for procedure for purposes other than remedying health state, unspecified: Secondary | ICD-10-CM | POA: Diagnosis not present

## 2021-12-15 DIAGNOSIS — Z419 Encounter for procedure for purposes other than remedying health state, unspecified: Secondary | ICD-10-CM | POA: Diagnosis not present

## 2022-01-15 DIAGNOSIS — Z419 Encounter for procedure for purposes other than remedying health state, unspecified: Secondary | ICD-10-CM | POA: Diagnosis not present

## 2022-02-14 DIAGNOSIS — Z419 Encounter for procedure for purposes other than remedying health state, unspecified: Secondary | ICD-10-CM | POA: Diagnosis not present

## 2022-03-17 DIAGNOSIS — Z419 Encounter for procedure for purposes other than remedying health state, unspecified: Secondary | ICD-10-CM | POA: Diagnosis not present

## 2022-04-17 DIAGNOSIS — Z419 Encounter for procedure for purposes other than remedying health state, unspecified: Secondary | ICD-10-CM | POA: Diagnosis not present

## 2022-05-16 DIAGNOSIS — Z419 Encounter for procedure for purposes other than remedying health state, unspecified: Secondary | ICD-10-CM | POA: Diagnosis not present

## 2022-06-16 DIAGNOSIS — Z419 Encounter for procedure for purposes other than remedying health state, unspecified: Secondary | ICD-10-CM | POA: Diagnosis not present

## 2022-07-16 DIAGNOSIS — Z419 Encounter for procedure for purposes other than remedying health state, unspecified: Secondary | ICD-10-CM | POA: Diagnosis not present

## 2022-07-17 DIAGNOSIS — M5441 Lumbago with sciatica, right side: Secondary | ICD-10-CM | POA: Diagnosis not present

## 2022-07-17 DIAGNOSIS — Z975 Presence of (intrauterine) contraceptive device: Secondary | ICD-10-CM | POA: Diagnosis not present

## 2022-07-17 DIAGNOSIS — J4520 Mild intermittent asthma, uncomplicated: Secondary | ICD-10-CM | POA: Diagnosis not present

## 2022-07-17 DIAGNOSIS — M5442 Lumbago with sciatica, left side: Secondary | ICD-10-CM | POA: Diagnosis not present

## 2022-07-17 DIAGNOSIS — Z0000 Encounter for general adult medical examination without abnormal findings: Secondary | ICD-10-CM | POA: Diagnosis not present

## 2022-07-17 DIAGNOSIS — G8929 Other chronic pain: Secondary | ICD-10-CM | POA: Diagnosis not present

## 2022-07-17 DIAGNOSIS — Z1331 Encounter for screening for depression: Secondary | ICD-10-CM | POA: Diagnosis not present

## 2022-07-17 NOTE — Progress Notes (Signed)
 Chief Complaint  Patient presents with  . Establish Care  . Annual Exam    [name] [name] is a 27 y.o. female who is here for an annual physical and to establish care.  1. Anxiety/depression: She was put on medication in post-partum period after her first child. She stopped medication because she didn't tolerate it. She notes that her anxiety will fluctuate, but she does not think she requires medication at this time. Exercise does help to mitigate stress.   2. Allergies/asthma: She uses an albuterol  inhaler as needed with flare-ups, particularly with weather changes and URIs. She needs a refill of albuterol  inhaler.   3. Back pain: She has been experiencing bilateral low back pain with sciatica in bilateral legs. She has been experiencing pain for the past 3 years since delivering her child. She does stretching and uses a heating pad which helps. She has increased pain with certain exercises.   Preventive Care:  - Dentist: no regular visits, brushes teeth twice daily  - Ophthalmology: does not wear contacts or glasses, no annual exams, no difficulties with vision  - Pap smear: 01/05/2018 (NILM), followed previously by OB/GYN. Has IUD in place (04/2020), defers today  - Mammogram: never. No family history of breast cancer.  - Colonoscopy: never. No family history of colon cancer.   - Exercise: recently started going to the gym 3 weeks ago for 5 days for 45 minutes (cardio + weight lifting)  - Healthy food choices: Fairly well. Good balance of fruits and vegetables. Eats fast food 2-3x per week. Drinks soda 0-1 daily, mostly water during the day. 1 cup of coffee daily  - Tdap: 04/22/2019  - COVID: declines  - HPV: unsure   Patient Active Problem List  Diagnosis  . Mild intermittent asthma without complication (HHS-HCC)    Past Medical History:  Diagnosis Date  . Asthma without status asthmaticus (HHS-HCC)   . Bell's palsy   . Depression   . Eczema, unspecified   . Environmental  allergies   . Headache, unspecified   . History of chickenpox   . Migraines   . Patellar dislocation 09/29/2009   left knee  . Patellar subluxation    lateral, recurrent, left knee    Past Surgical History:  Procedure Laterality Date  . TONSILLECTOMY  2002  . RECONSTRUCTION MEDIAL PATELLOFEMORAL LIGAMENT KNEE Left 11/02/13  . Arthroscopic lateral release  12/06/2009 by Dr. Kathlynn  . Tubes in ears      Family History  Problem Relation Name Age of Onset  . Asthma Mother    . Allergies Mother    . High blood pressure (Hypertension) Father    . Hyperlipidemia (Elevated cholesterol) Father    . Anemia Sister    . Seizures Sister    . ADD / ADHD Brother    . Allergic rhinitis Daughter Malani   . Autism Son Leonce   . Dementia Maternal Grandmother    . Diabetes type II Paternal Grandmother    . Colon polyps Neg Hx    . Colon cancer Neg Hx      Social History   Socioeconomic History  . Marital status: Single  Tobacco Use  . Smoking status: Never  . Smokeless tobacco: Never  Vaping Use  . Vaping status: Never Used  Substance and Sexual Activity  . Alcohol use: Yes    Comment: once weekly  . Drug use: Yes    Comment: marijuana  . Sexual activity: Yes    Partners: Male  Birth control/protection: I.U.D.   Social Determinants of Health   Financial Resource Strain: Low Risk  (07/17/2022)   Overall Financial Resource Strain (CARDIA)   . Difficulty of Paying Living Expenses: Not very hard  Food Insecurity: No Food Insecurity (07/17/2022)   Hunger Vital Sign   . Worried About Programme researcher, broadcasting/film/video in the Last Year: Never true   . Ran Out of Food in the Last Year: Never true  Transportation Needs: No Transportation Needs (07/17/2022)   PRAPARE - Transportation   . Lack of Transportation (Medical): No   . Lack of Transportation (Non-Medical): No   [facility] from [facility]   Social Network  Housing Stability: Low Risk  (07/17/2022)   Housing Stability Vital  Sign   . Unable to Pay for Housing in the Last Year: No   . Number of Times Moved in the Last Year: 1   . Homeless in the Last Year: No    Current Outpatient Medications  Medication Sig Dispense Refill  . albuterol  MDI, PROVENTIL , VENTOLIN , PROAIR , HFA (PROAIR  HFA) 90 mcg/actuation inhaler Inhale 2 inhalations into the lungs every 6 (six) hours as needed for Wheezing 8.5 each 1  . levonorgestreL  (MIRENA  52 MG) IUD Insert 1 each into the uterus once Follow package directions.     No current facility-administered medications for this visit.    Allergies  Allergen Reactions  . Ibuprofen Other (See Comments)    ibuprofen 800 mg - Vomiting and Shaking  . Naproxen  Itching and Nausea    Results for orders placed or performed in visit on 10/14/16  CBC w/auto Differential (5 Part)  Result Value Ref Range   WBC (White Blood Cell Count) 9.9 4.1 - 10.2 10^3/uL   RBC (Red Blood Cell Count) 4.81 4.04 - 5.48 10^6/uL   Hemoglobin 12.1 12.0 - 15.0 gm/dL   Hematocrit 62.0 64.9 - 47.0 %   MCV (Mean Corpuscular Volume) 78.8 (L) 80.0 - 100.0 fl   MCH (Mean Corpuscular Hemoglobin) 25.2 (L) 27.0 - 31.2 pg   MCHC (Mean Corpuscular Hemoglobin Concentration) 31.9 (L) 32.0 - 36.0 gm/dL   Platelet Count 710 849 - 450 10^3/uL   RDW-CV (Red Cell Distribution Width) 14.4 11.6 - 14.8 %   MPV (Mean Platelet Volume) 12.0 9.4 - 12.4 fl   Neutrophils 7.10 1.50 - 7.80 10^3/uL   Lymphocytes 2.08 1.00 - 3.60 10^3/uL   Monocytes 0.47 0.00 - 1.50 10^3/uL   Eosinophils 0.14 0.00 - 0.55 10^3/uL   Basophils 0.08 0.00 - 0.09 10^3/uL   Neutrophil % 71.8 (H) 32.0 - 70.0 %   Lymphocyte % 21.0 10.0 - 50.0 %   Monocyte % 4.8 4.0 - 13.0 %   Eosinophil % 1.4 1.0 - 5.0 %   Basophil% 0.8 0.0 - 2.0 %   Immature Granulocyte % 0.2 <=0.7 %   Immature Granulocyte Count 0.02 <=0.06 10^3/L  Comprehensive Metabolic Panel (CMP)  Result Value Ref Range   Glucose 102 70 - 110 mg/dL   Sodium 860 863 - 854 mmol/L   Potassium 3.9  3.6 - 5.1 mmol/L   Chloride 106 97 - 109 mmol/L   Carbon Dioxide (CO2) 27.1 22.0 - 32.0 mmol/L   Urea Nitrogen (BUN) 8 7 - 25 mg/dL   Creatinine 0.7 0.6 - 1.1 mg/dL   Glomerular Filtration Rate (eGFR) 129 >60 mL/min/1.73sq m   Calcium 9.0 8.7 - 10.3 mg/dL   AST  10 8 - 39 U/L   ALT  10 5 -  38 U/L   Alk Phos (alkaline Phosphatase) 51 34 - 104 U/L   Albumin 3.8 3.5 - 4.8 g/dL   Bilirubin, Total 0.4 0.3 - 1.2 mg/dL   Protein, Total 6.8 6.1 - 7.9 g/dL   A/G Ratio 1.3 1.0 - 5.0 gm/dL  Pregnancy Test (HCG), Urine Qualitative  Result Value Ref Range   Pregnancy Test (HCG), Qual Urine Negative Negative  Lipase  Result Value Ref Range   Lipase 6 (L) 11 - 82 U/L    BP Readings from Last 3 Encounters:  07/17/22 132/84  08/01/20 131/74  10/12/18 140/86    Wt Readings from Last 3 Encounters:  07/17/22 90.4 kg (199 lb 6.4 oz)  08/01/20 81.6 kg (180 lb)  10/14/16 77.7 kg (171 lb 3.2 oz)     PHQ 2/9 from today's flowsheet  PHQ-2 PHQ-2 Over the last 2 weeks, how often have you been bothered by any of the following problems? Little interest or pleasure in doing things: More than half the days Feeling down, depressed, or hopeless: Several days Patient Health Questionnaire-2 Score: 3  PHQ-9 (if PHQ >=3) PHQ-9 Over the last 2 weeks, how often have you been bothered by any of the following problems? Trouble falling or staying asleep, or sleeping too much: Not at all Feeling tired or having little energy: Nearly every day Poor appetite or overeating: Not at all Feeling bad about yourself - or that you are a failure or have let yourself or your family down: Not at all Trouble concentrating on things, such as reading the newspaper or watching television: Not at all Moving or speaking so slowly that other people could have noticed? Or the opposite - being so fidgety or restless that you have been moving around a lot more than usual.: Not at all Thoughts that you would be better off dead or  hurting yourself in some way: Not at all How difficult have these problems made it for you to do your work, take care of things at home, or get along with other people?: Somewhat difficult Patient Health Questionnaire-9 Score: 6  Depression Severity and Treatment Recommendations:  0-4= None  5-9= Mild / Treatment: Support, educate to call if worse; return in one month  10-14= Moderate / Treatment: Support, watchful waiting; Antidepressant or Psychotherapy  15-19= Moderately severe / Treatment: Antidepressant OR Psychotherapy  >= 20 = Major depression, severe / Antidepressant AND Psychotherapy   Goals Addressed             This Visit's Progress   . Lose Weight            Review of Systems:    Constitutional: No fever, No fatigue, No night sweats, No weakness, No significant weight change.  Skin: No rash, No suspicious or changing lesions.  HEENT: No change in vision, No headaches, No mouth sores, No difficulty swallowing.  Cardiovascular: No chest pain, No palpitations, No lower extremity edema, No dyspnea on exertion.  Respiratory: No shortness of breath, No cough, No hemoptysis.  GI: No blood in stool, No significant change in bowel habits, No constipation, No diarrhea, No heartburn, No vomiting.  GU: No difficulty urinating, No dysuria, No hematuria, No urinary urgency, No urinary frequency.  MSK: Yes joint pain, No joint stiffness, No joint swelling.  Neurologic: No numbness or tingling, No memory loss, No seizures, No tremor.  Psychologic: No depression, No anxiety, No sleep disturbance.    Physical Exam:    Vitals:   07/17/22 1353  BP: 132/84  Pulse: 87  SpO2: 98%  Weight: 90.4 kg (199 lb 6.4 oz)  Height: 162.5 cm (5' 3.98)    Body mass index is 34.25 kg/m.  General: Well-appearing, well-developed, well-nourished, alert and oriented, appears stated age, no acute distress.  Skin: No obvious or visible rash or concerning appearing skin lesions on  extremities.   HEENT: Head is normocephalic and atraumatic. Ear canals clear bilaterally, TM's clear without abnormality, neck supple with no enlarged cervical lymph nodes, thyroid  non-tender without nodules. Eyes clear without injection, PERRL.  Cardiovascular: Normal S1 and S2 with regular rhythm and normal rate with no murmur, No JVD. Extremities warm without edema. Pedal pulses intact bilaterally.   Respiratory: Clear to auscultation bilaterally with no wheezing, rhonchi, or crackles. No increased work of breathing. Good air movement.  GI: Abdomen soft, non-tender, non-distended, normal bowel sounds present. No masses or organomegaly appreciated.  GU: Deferred based on lack of symptoms.  MSK: No joint erythema, normal range of motion without pain in upper and lower extremities. Tenderness to palpation of bilateral SI joint and lumbar paraspinal muscles  Neurologic: Alert and oriented x 3, cranial nerves grossly intact without focal abnormalities.   Psych: Affect appropriate, mood normal, insight normal, judgment normal.   Assessment/Plan:   1. Annual physical exam [name] is here for annual exam and to establish care. She defers pap smear and blood work today and will return at a later time.   2. Mild intermittent asthma without complication (HHS-HCC) She uses albuterol  inhaler infrequently as needed, particularly with weather changes. Refill sent to pharmacy.   3. Chronic bilateral low back pain with bilateral sciatica She has been experiencing bilateral low back pain for the past 3 years since pregnancy. She has been going to the gym recently without significant improvement in pain. She occasionally has intermittent and alternating sciatica. Will refer to physical therapy for formal evaluation and treatment.  -     Ambulatory Referral to Physical Therapy  4. IUD (intrauterine device) in place Stable. IUD placed 04/2020. Menses remain irregular.   5. Screening for depression PHQ-9  = 6. She denies need for medication.   Other orders -     albuterol  MDI, PROVENTIL , VENTOLIN , PROAIR , HFA (PROAIR  HFA) 90 mcg/actuation inhaler; Inhale 2 inhalations into the lungs every 6 (six) hours as needed for Wheezing    Wellness exam:  Diet and Exercise: I discussed the importance of a healthy lifestyle including appropriate food choices and regular exercise. I recommended a diet that is moderate in fiber with fresh fruits and vegetables and low in saturated fats and simple carbohydrates. I recommended getting some form of exercise daily and moderate-to-high intensity exercise 5-7 days per week to include a total of at least 150 minutes per week.   Vaccines/Immunizations: The patient is up to date on age-appropriate screening exams and vaccines.       Follow-up in 1 year for annual exam, sooner as needed.    Edsel Pepper, PA-C

## 2022-08-16 DIAGNOSIS — Z419 Encounter for procedure for purposes other than remedying health state, unspecified: Secondary | ICD-10-CM | POA: Diagnosis not present

## 2022-09-15 DIAGNOSIS — Z419 Encounter for procedure for purposes other than remedying health state, unspecified: Secondary | ICD-10-CM | POA: Diagnosis not present

## 2022-10-16 DIAGNOSIS — Z419 Encounter for procedure for purposes other than remedying health state, unspecified: Secondary | ICD-10-CM | POA: Diagnosis not present

## 2022-11-16 DIAGNOSIS — Z419 Encounter for procedure for purposes other than remedying health state, unspecified: Secondary | ICD-10-CM | POA: Diagnosis not present

## 2022-12-16 DIAGNOSIS — Z419 Encounter for procedure for purposes other than remedying health state, unspecified: Secondary | ICD-10-CM | POA: Diagnosis not present

## 2023-01-16 DIAGNOSIS — Z419 Encounter for procedure for purposes other than remedying health state, unspecified: Secondary | ICD-10-CM | POA: Diagnosis not present

## 2023-02-15 DIAGNOSIS — Z419 Encounter for procedure for purposes other than remedying health state, unspecified: Secondary | ICD-10-CM | POA: Diagnosis not present

## 2023-02-19 ENCOUNTER — Other Ambulatory Visit: Payer: Self-pay

## 2023-02-19 ENCOUNTER — Emergency Department: Payer: Medicaid Other

## 2023-02-19 DIAGNOSIS — T8332XD Displacement of intrauterine contraceptive device, subsequent encounter: Secondary | ICD-10-CM | POA: Diagnosis not present

## 2023-02-19 DIAGNOSIS — R1030 Lower abdominal pain, unspecified: Secondary | ICD-10-CM | POA: Insufficient documentation

## 2023-02-19 DIAGNOSIS — R110 Nausea: Secondary | ICD-10-CM | POA: Diagnosis not present

## 2023-02-19 DIAGNOSIS — K429 Umbilical hernia without obstruction or gangrene: Secondary | ICD-10-CM | POA: Diagnosis not present

## 2023-02-19 DIAGNOSIS — T8332XA Displacement of intrauterine contraceptive device, initial encounter: Secondary | ICD-10-CM | POA: Diagnosis not present

## 2023-02-19 DIAGNOSIS — R109 Unspecified abdominal pain: Secondary | ICD-10-CM | POA: Diagnosis not present

## 2023-02-19 LAB — CBC WITH DIFFERENTIAL/PLATELET
Abs Immature Granulocytes: 0.05 10*3/uL (ref 0.00–0.07)
Basophils Absolute: 0.1 10*3/uL (ref 0.0–0.1)
Basophils Relative: 1 %
Eosinophils Absolute: 0.4 10*3/uL (ref 0.0–0.5)
Eosinophils Relative: 3 %
HCT: 42.1 % (ref 36.0–46.0)
Hemoglobin: 13.9 g/dL (ref 12.0–15.0)
Immature Granulocytes: 0 %
Lymphocytes Relative: 32 %
Lymphs Abs: 5.1 10*3/uL — ABNORMAL HIGH (ref 0.7–4.0)
MCH: 27.5 pg (ref 26.0–34.0)
MCHC: 33 g/dL (ref 30.0–36.0)
MCV: 83.2 fL (ref 80.0–100.0)
Monocytes Absolute: 0.8 10*3/uL (ref 0.1–1.0)
Monocytes Relative: 5 %
Neutro Abs: 9.3 10*3/uL — ABNORMAL HIGH (ref 1.7–7.7)
Neutrophils Relative %: 59 %
Platelets: 290 10*3/uL (ref 150–400)
RBC: 5.06 MIL/uL (ref 3.87–5.11)
RDW: 13 % (ref 11.5–15.5)
WBC: 15.8 10*3/uL — ABNORMAL HIGH (ref 4.0–10.5)
nRBC: 0 % (ref 0.0–0.2)

## 2023-02-19 LAB — COMPREHENSIVE METABOLIC PANEL
ALT: 24 U/L (ref 0–44)
AST: 19 U/L (ref 15–41)
Albumin: 4.1 g/dL (ref 3.5–5.0)
Alkaline Phosphatase: 59 U/L (ref 38–126)
Anion gap: 8 (ref 5–15)
BUN: 13 mg/dL (ref 6–20)
CO2: 24 mmol/L (ref 22–32)
Calcium: 8.7 mg/dL — ABNORMAL LOW (ref 8.9–10.3)
Chloride: 104 mmol/L (ref 98–111)
Creatinine, Ser: 0.71 mg/dL (ref 0.44–1.00)
GFR, Estimated: >60 mL/min (ref >60)
Glucose, Bld: 115 mg/dL — ABNORMAL HIGH (ref 70–99)
Potassium: 3.7 mmol/L (ref 3.5–5.1)
Sodium: 136 mmol/L (ref 135–145)
Total Bilirubin: 0.4 mg/dL (ref <1.2)
Total Protein: 7.6 g/dL (ref 6.5–8.1)

## 2023-02-19 LAB — URINALYSIS, ROUTINE W REFLEX MICROSCOPIC
Bilirubin Urine: NEGATIVE
Glucose, UA: NEGATIVE mg/dL
Hgb urine dipstick: NEGATIVE
Ketones, ur: NEGATIVE mg/dL
Leukocytes,Ua: NEGATIVE
Nitrite: NEGATIVE
Protein, ur: NEGATIVE mg/dL
Specific Gravity, Urine: 1.02 (ref 1.005–1.030)
pH: 5 (ref 5.0–8.0)

## 2023-02-19 LAB — POC URINE PREG, ED: Preg Test, Ur: NEGATIVE

## 2023-02-19 LAB — LIPASE, BLOOD: Lipase: 23 U/L (ref 11–51)

## 2023-02-19 MED ORDER — ONDANSETRON HCL 4 MG PO TABS
8.0000 mg | ORAL_TABLET | Freq: Once | ORAL | Status: AC
  Administered 2023-02-19: 8 mg via ORAL
  Filled 2023-02-19: qty 2

## 2023-02-19 MED ORDER — ACETAMINOPHEN 500 MG PO TABS
ORAL_TABLET | ORAL | Status: AC
  Filled 2023-02-19: qty 2

## 2023-02-19 MED ORDER — ACETAMINOPHEN 500 MG PO TABS
1000.0000 mg | ORAL_TABLET | Freq: Once | ORAL | Status: AC
  Administered 2023-02-19: 1000 mg via ORAL

## 2023-02-19 NOTE — ED Triage Notes (Signed)
Acute onset lower/suprapubic abd pain. Denies v/d. Reports nausea. . Denies vaginal bleeding or discharge. Denies urinary symptoms. Ambulatory to triage. Alert and oriented. Breathing unlabored speaking in full sentences.

## 2023-02-19 NOTE — ED Provider Triage Note (Signed)
Emergency Medicine Provider Triage Evaluation Note  Lavren R Skeels , a 27 y.o. female  was evaluated in triage.  Pt complains of abdominal pain in the lower abdomen.  Review of Systems  Positive: Abdominal pain, nausea Negative: Diarrhea, constipation, vomiting, vaginal bleeding, vaginal discharge, urinary symptoms  Physical Exam  There were no vitals taken for this visit. Gen:   Awake, no distress   Resp:  Normal effort  MSK:   Moves extremities without difficulty  Other:    Medical Decision Making  Medically screening exam initiated at 10:55 PM.  Appropriate orders placed.  Audriana R Buffin was informed that the remainder of the evaluation will be completed by another provider, this initial triage assessment does not replace that evaluation, and the importance of remaining in the ED until their evaluation is complete.     Cameron Ali, PA-C 02/19/23 2259

## 2023-02-20 ENCOUNTER — Encounter: Payer: Self-pay | Admitting: Emergency Medicine

## 2023-02-20 ENCOUNTER — Emergency Department
Admission: EM | Admit: 2023-02-20 | Discharge: 2023-02-20 | Disposition: A | Discharge status: Home / Self Care | Payer: Medicaid Other | Source: Home / Self Care | Attending: Emergency Medicine | Admitting: Emergency Medicine

## 2023-02-20 ENCOUNTER — Emergency Department
Admission: EM | Admit: 2023-02-20 | Discharge: 2023-02-20 | Disposition: A | Discharge status: Home / Self Care | Payer: Medicaid Other | Attending: Emergency Medicine | Admitting: Emergency Medicine

## 2023-02-20 DIAGNOSIS — T8332XD Displacement of intrauterine contraceptive device, subsequent encounter: Secondary | ICD-10-CM | POA: Diagnosis not present

## 2023-02-20 DIAGNOSIS — T8332XA Displacement of intrauterine contraceptive device, initial encounter: Secondary | ICD-10-CM

## 2023-02-20 DIAGNOSIS — T839XXD Unspecified complication of genitourinary prosthetic device, implant and graft, subsequent encounter: Secondary | ICD-10-CM | POA: Insufficient documentation

## 2023-02-20 DIAGNOSIS — R1030 Lower abdominal pain, unspecified: Secondary | ICD-10-CM

## 2023-02-20 DIAGNOSIS — Y732 Prosthetic and other implants, materials and accessory gastroenterology and urology devices associated with adverse incidents: Secondary | ICD-10-CM | POA: Insufficient documentation

## 2023-02-20 LAB — URINALYSIS, ROUTINE W REFLEX MICROSCOPIC
Bilirubin Urine: NEGATIVE
Glucose, UA: NEGATIVE mg/dL
Hgb urine dipstick: NEGATIVE
Ketones, ur: NEGATIVE mg/dL
Nitrite: NEGATIVE
Protein, ur: NEGATIVE mg/dL
RBC / HPF: 0 RBC/hpf (ref 0–5)
Specific Gravity, Urine: 1.014 (ref 1.005–1.030)
pH: 6 (ref 5.0–8.0)

## 2023-02-20 LAB — POC URINE PREG, ED: Preg Test, Ur: NEGATIVE

## 2023-02-20 MED ORDER — ONDANSETRON 4 MG PO TBDP: 4.0000 mg | ORAL_TABLET | Freq: Three times a day (TID) | ORAL | 0 refills | Status: DC | PRN

## 2023-02-20 MED ORDER — OXYCODONE-ACETAMINOPHEN 5-325 MG PO TABS: 1.0000 | ORAL_TABLET | ORAL | 0 refills | Status: AC | PRN

## 2023-02-20 MED ORDER — ONDANSETRON 4 MG PO TBDP
4.0000 mg | ORAL_TABLET | Freq: Once | ORAL | Status: AC
  Administered 2023-02-20: 4 mg via ORAL
  Filled 2023-02-20: qty 1

## 2023-02-20 NOTE — Discharge Instructions (Signed)
You are seen in the emergency department for lower abdominal pain.  You had a CT scan done yesterday that showed that your IUD was in correct location.  It is importantly follow-up closely with gynecology, we are unable to remove your IUD in the emergency department.  Return to the emergency department if you have any fever, worsening pain or severe nausea and vomiting.  Stay hydrated and drink plenty of fluids.  You can utilize a heat pad.  You can use Tylenol 650 mg every 6 hours for pain control.  You were given a prescription for an opioid pain medication.  Only take it if you have severe pain.  You were given a prescription for narcotic pain medications.  Take only if in severe pain.  These are very addictive medications.  These medications can make you constipated.  If you need to take more than 1-2 doses, start a stool softner.  If you become constipated, take 1 capfull of MiraLAX, can repeat untill having regular bowel movements.  Keep this medication out of reach of any children.  Thank you for choosing [place] for your health care, it was my pleasure to care for you today!  Corena Herter, MD

## 2023-02-20 NOTE — ED Provider Triage Note (Signed)
Emergency Medicine Provider Triage Evaluation Note  Haven R Njie , a 27 y.o. female  was evaluated in triage.  Pt complains of low abdominal pain since last night. Came last night and was told that her IUD is malpositioned and to follow up with OB but can't see them until 12/16. No vaginal discharge or bleeding. Reports that IUD was placed in April. No dyspareunia. No epigastric pain. Pain is suprapubic. No fever/chills  Review of Systems  Positive: Low abd pain Negative: Bleeding, discharge, fever  Physical Exam  There were no vitals taken for this visit. Gen:   Awake, no distress   Resp:  Normal effort  MSK:   Moves extremities without difficulty  Other:    Medical Decision Making  Medically screening exam initiated at 9:59 AM.  Appropriate orders placed.  Lil R Nathanson was informed that the remainder of the evaluation will be completed by another provider, this initial triage assessment does not replace that evaluation, and the importance of remaining in the ED until their evaluation is complete.     Jackelyn Hoehn, PA-C 02/20/23 1002

## 2023-02-20 NOTE — ED Triage Notes (Signed)
Patient to ED via POV for abd pain. Seen last PM in ED and dc'd. Told her IUD was out of place and needs to f/u with OBGYN. PT states she is unable to get in with them until the 16th.

## 2023-02-20 NOTE — ED Provider Notes (Signed)
[facility] Provider Note    Event Date/Time   First MD Initiated Contact with Patient 02/20/23 1112     (approximate)   History   Abdominal Pain   HPI  [name] [name] is a 27 y.o. female presents to the emergency department lower abdominal pain.  Patient was seen yesterday in the emergency department for lower abdominal pain.  States that she had a CT scan and was told that her IUD was inverted and needed to be removed or repositioned.  States that she has called multiple gynecologist office and is unable to get an appointment.  Complaining of nausea but no episodes of vomiting.  Denies any dysuria, urinary urgency or frequency.  Denies any upper abdominal pain.  States that she has an allergy to ibuprofen.  No change in her pain since yesterday.  Denies concern for pregnancy.  No vaginal discharge.     Physical Exam   Triage Vital Signs: ED Triage Vitals [02/20/23 1000]  Encounter Vitals Group     BP (!) 144/98     Systolic BP Percentile      Diastolic BP Percentile      Pulse Rate 91     Resp 18     Temp 97.9 F (36.6 C)     Temp Source Oral     SpO2 100 %     Weight 190 lb (86.2 kg)     Height 5\' 3"  (1.6 m)     Head Circumference      Peak Flow      Pain Score 9     Pain Loc      Pain Education      Exclude from Growth Chart     Most recent vital signs: Vitals:   02/20/23 1000  BP: (!) 144/98  Pulse: 91  Resp: 18  Temp: 97.9 F (36.6 C)  SpO2: 100%    Physical Exam Constitutional:      Appearance: She is well-developed.  HENT:     Head: Atraumatic.  Eyes:     Conjunctiva/sclera: Conjunctivae normal.  Cardiovascular:     Rate and Rhythm: Regular rhythm.  Pulmonary:     Effort: No respiratory distress.  Abdominal:     General: There is no distension.     Tenderness: There is abdominal tenderness in the suprapubic area. There is no right CVA tenderness, left CVA tenderness, guarding or rebound. Negative signs include  Murphy's sign and McBurney's sign.  Musculoskeletal:        General: Normal range of motion.     Cervical back: Normal range of motion.  Skin:    General: Skin is warm.  Neurological:     Mental Status: She is alert. Mental status is at baseline.      IMPRESSION / MDM / ASSESSMENT AND PLAN / ED COURSE  I reviewed the triage vital signs and the nursing notes.  On chart review patient had a CT scan done yesterday that showed an inverted IUD.  No other acute findings with normal appendix on CT scan.  No adnexal fullness or tenderness.  Patient's pregnancy test is negative.  Labs (all labs ordered are listed, but only abnormal results are displayed) Labs interpreted as -    Labs Reviewed  URINALYSIS, ROUTINE W REFLEX MICROSCOPIC - Abnormal; Notable for the following components:      Result Value   Color, Urine YELLOW (*)    APPearance CLEAR (*)    Leukocytes,Ua TRACE (*)  Bacteria, UA RARE (*)    All other components within normal limits  POC URINE PREG, ED      No signs of urinary tract infection.  Patient without any symptoms.  Pregnancy test is negative.  On review of labs that were done yesterday had some mild leukocytosis but otherwise lab work was overall reassuring.  CT scan had some questionable findings of pancreatitis however lipase was within normal limits and patient without any other symptoms of acute pancreatitis.  Given Zofran in the emergency department.  Patient able to tolerate p.o.  Given information for close outpatient gynecologic follow-up.  Will do a short course of pain medication given her allergy to ibuprofen and ongoing pain.  Given return precautions for any ongoing or worsening symptoms.   PROCEDURES:  Critical Care performed: No  Procedures  Patient's presentation is most consistent with acute complicated illness / injury requiring diagnostic workup.   MEDICATIONS ORDERED IN ED: Medications  ondansetron (ZOFRAN-ODT) disintegrating tablet 4  mg (4 mg Oral Given 02/20/23 1154)    FINAL CLINICAL IMPRESSION(S) / ED DIAGNOSES   Final diagnoses:  Complication of intrauterine device (IUD), unspecified complication, subsequent encounter     Rx / DC Orders   ED Discharge Orders          Ordered    oxyCODONE-acetaminophen (PERCOCET) 5-325 MG tablet  Every 4 hours PRN        02/20/23 1148             Note:  This document was prepared using Dragon voice recognition software and may include unintentional dictation errors.   Corena Herter, MD 02/20/23 1202

## 2023-02-20 NOTE — ED Notes (Signed)
See triage note  Presents with some abd pain   Was not able to get in to see her PCP  States she was told that she needed to have IUD removed

## 2023-02-20 NOTE — Discharge Instructions (Signed)
 Please take Tylenol and ibuprofen/Advil for your pain.  It is safe to take them together, or to alternate them every few hours.  Take up to 1000mg  of Tylenol at a time, up to 4 times per day.  Do not take more than 4000 mg of Tylenol in 24 hours.  For ibuprofen, take 400-600 mg, 3 - 4 times per day.  Zofran as needed for nausea and vomiting

## 2023-02-25 NOTE — Progress Notes (Unsigned)
    GYNECOLOGY OFFICE PROCEDURE NOTE  [name] [name] is a 27 y.o. [phone] here for Mirena IUD removal. Pelvic pain since last Thursday, went to ED had a CT abd on 12/5 that showed "Inverted positioning of the intrauterine device with arms in the lower uterus with possible myometrial penetration." Pt states she can feel the device and she desires in-office removal, would like to start Nuvaring. Has taken COCs before without difficulty or significant side effects, denies migraines with aura, does not smoke, and no hx of VTE. Last pap smear was on 01/05/2018 and was normal.  IUD Removal  Patient identified, informed consent performed, consent signed.  Patient was in the dorsal lithotomy position, normal external genitalia was noted.  A speculum was placed in the patient's vagina, normal discharge was noted, no lesions. The cervix was visualized, no lesions, no abnormal discharge.  The strings of the IUD were grasped and pulled using ring forceps. The straight portion of IUD rotated in AP plane and was then visible within os. Firmer traction required to remove the arms, offered patient to discontinue due to her discomfort, she elected to proceed. With a firm tug, arms came free. The IUD was removed in its entirety. What appeared to be a small amount of endometrial tissue on base of straight portion.  Patient tolerated the procedure well.    Patient will use Nuvaring for contraception. Discussed and sent Rx for 12 mos. Routine preventative health maintenance measures emphasized.   Julieanne Manson, DO  [facility]

## 2023-02-26 ENCOUNTER — Encounter: Payer: Self-pay | Admitting: Obstetrics

## 2023-02-26 ENCOUNTER — Ambulatory Visit: Payer: Medicaid Other | Admitting: Obstetrics

## 2023-02-26 VITALS — BP 117/78 | HR 103 | Ht 63.0 in | Wt 215.0 lb

## 2023-02-26 DIAGNOSIS — Z30432 Encounter for removal of intrauterine contraceptive device: Secondary | ICD-10-CM | POA: Diagnosis not present

## 2023-02-26 DIAGNOSIS — Z30015 Encounter for initial prescription of vaginal ring hormonal contraceptive: Secondary | ICD-10-CM

## 2023-02-26 DIAGNOSIS — T8332XD Displacement of intrauterine contraceptive device, subsequent encounter: Secondary | ICD-10-CM | POA: Diagnosis not present

## 2023-02-26 DIAGNOSIS — T8332XA Displacement of intrauterine contraceptive device, initial encounter: Secondary | ICD-10-CM

## 2023-02-26 MED ORDER — ETONOGESTREL-ETHINYL ESTRADIOL 0.12-0.015 MG/24HR VA RING: VAGINAL_RING | VAGINAL | 12 refills | Status: AC

## 2023-02-26 NOTE — Patient Instructions (Signed)
Ring Fact Sheet and Reminders: https://www.reproductiveaccess.org/wp-content/uploads/2024/07/2022-10-Ring-User-Guide_Final.pdf

## 2023-03-18 DIAGNOSIS — Z419 Encounter for procedure for purposes other than remedying health state, unspecified: Secondary | ICD-10-CM | POA: Diagnosis not present

## 2023-04-13 ENCOUNTER — Other Ambulatory Visit: Payer: Self-pay

## 2023-04-13 ENCOUNTER — Emergency Department
Admission: EM | Admit: 2023-04-13 | Discharge: 2023-04-13 | Disposition: A | Discharge status: Home / Self Care | Payer: No Typology Code available for payment source | Attending: Emergency Medicine | Admitting: Emergency Medicine

## 2023-04-13 DIAGNOSIS — M545 Low back pain, unspecified: Secondary | ICD-10-CM | POA: Diagnosis not present

## 2023-04-13 DIAGNOSIS — Y92410 Unspecified street and highway as the place of occurrence of the external cause: Secondary | ICD-10-CM | POA: Insufficient documentation

## 2023-04-13 DIAGNOSIS — M542 Cervicalgia: Secondary | ICD-10-CM | POA: Diagnosis present

## 2023-04-13 MED ORDER — CYCLOBENZAPRINE HCL 5 MG PO TABS: 5.0000 mg | ORAL_TABLET | Freq: Three times a day (TID) | ORAL | 0 refills | Status: AC | PRN

## 2023-04-13 MED ORDER — ONDANSETRON 4 MG PO TBDP: 4.0000 mg | ORAL_TABLET | Freq: Three times a day (TID) | ORAL | 0 refills | Status: DC | PRN

## 2023-04-13 MED ORDER — ACETAMINOPHEN 500 MG PO TABS
1000.0000 mg | ORAL_TABLET | Freq: Once | ORAL | Status: AC
  Administered 2023-04-13: 1000 mg via ORAL
  Filled 2023-04-13: qty 2

## 2023-04-13 MED ORDER — ONDANSETRON 4 MG PO TBDP
4.0000 mg | ORAL_TABLET | Freq: Once | ORAL | Status: AC
  Administered 2023-04-13: 4 mg via ORAL
  Filled 2023-04-13: qty 1
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
## 2023-04-18 DIAGNOSIS — Z419 Encounter for procedure for purposes other than remedying health state, unspecified: Secondary | ICD-10-CM | POA: Diagnosis not present

## 2023-05-16 DIAGNOSIS — Z419 Encounter for procedure for purposes other than remedying health state, unspecified: Secondary | ICD-10-CM | POA: Diagnosis not present

## 2023-06-27 DIAGNOSIS — Z419 Encounter for procedure for purposes other than remedying health state, unspecified: Secondary | ICD-10-CM | POA: Diagnosis not present

## 2023-07-27 DIAGNOSIS — Z419 Encounter for procedure for purposes other than remedying health state, unspecified: Secondary | ICD-10-CM | POA: Diagnosis not present

## 2023-08-27 DIAGNOSIS — Z419 Encounter for procedure for purposes other than remedying health state, unspecified: Secondary | ICD-10-CM | POA: Diagnosis not present

## 2023-09-26 DIAGNOSIS — Z419 Encounter for procedure for purposes other than remedying health state, unspecified: Secondary | ICD-10-CM | POA: Diagnosis not present

## 2023-10-27 DIAGNOSIS — Z419 Encounter for procedure for purposes other than remedying health state, unspecified: Secondary | ICD-10-CM | POA: Diagnosis not present

## 2023-11-06 ENCOUNTER — Emergency Department
Admission: EM | Admit: 2023-11-06 | Discharge: 2023-11-07 | Disposition: A | Discharge status: Home / Self Care | Attending: Emergency Medicine | Admitting: Emergency Medicine

## 2023-11-06 ENCOUNTER — Emergency Department

## 2023-11-06 ENCOUNTER — Other Ambulatory Visit: Payer: Self-pay

## 2023-11-06 DIAGNOSIS — D72829 Elevated white blood cell count, unspecified: Secondary | ICD-10-CM | POA: Diagnosis not present

## 2023-11-06 DIAGNOSIS — O219 Vomiting of pregnancy, unspecified: Secondary | ICD-10-CM | POA: Diagnosis present

## 2023-11-06 DIAGNOSIS — Z3A01 Less than 8 weeks gestation of pregnancy: Secondary | ICD-10-CM | POA: Insufficient documentation

## 2023-11-06 LAB — CBC
HCT: 42.6 % (ref 36.0–46.0)
Hemoglobin: 14.1 g/dL (ref 12.0–15.0)
MCH: 26.5 pg (ref 26.0–34.0)
MCHC: 33.1 g/dL (ref 30.0–36.0)
MCV: 79.9 fL — ABNORMAL LOW (ref 80.0–100.0)
Platelets: 327 K/uL (ref 150–400)
RBC: 5.33 MIL/uL — ABNORMAL HIGH (ref 3.87–5.11)
RDW: 13.1 % (ref 11.5–15.5)
WBC: 15.9 K/uL — ABNORMAL HIGH (ref 4.0–10.5)
nRBC: 0 % (ref 0.0–0.2)

## 2023-11-06 LAB — POC URINE PREG, ED: Preg Test, Ur: POSITIVE — AB

## 2023-11-06 LAB — URINALYSIS, ROUTINE W REFLEX MICROSCOPIC
Bilirubin Urine: NEGATIVE
Glucose, UA: NEGATIVE mg/dL
Hgb urine dipstick: NEGATIVE
Ketones, ur: 80 mg/dL — AB
Nitrite: NEGATIVE
Protein, ur: 100 mg/dL — AB
Specific Gravity, Urine: 1.03 (ref 1.005–1.030)
pH: 5 (ref 5.0–8.0)

## 2023-11-06 LAB — COMPREHENSIVE METABOLIC PANEL WITH GFR
ALT: 17 U/L (ref 0–44)
AST: 18 U/L (ref 15–41)
Albumin: 3.9 g/dL (ref 3.5–5.0)
Alkaline Phosphatase: 57 U/L (ref 38–126)
Anion gap: 11 (ref 5–15)
BUN: 6 mg/dL (ref 6–20)
CO2: 24 mmol/L (ref 22–32)
Calcium: 9.5 mg/dL (ref 8.9–10.3)
Chloride: 103 mmol/L (ref 98–111)
Creatinine, Ser: 0.57 mg/dL (ref 0.44–1.00)
GFR, Estimated: >60 mL/min (ref >60)
Glucose, Bld: 112 mg/dL — ABNORMAL HIGH (ref 70–99)
Potassium: 4.6 mmol/L (ref 3.5–5.1)
Sodium: 138 mmol/L (ref 135–145)
Total Bilirubin: 0.5 mg/dL (ref 0.0–1.2)
Total Protein: 7.5 g/dL (ref 6.5–8.1)

## 2023-11-06 LAB — LIPASE, BLOOD: Lipase: 23 U/L (ref 11–51)

## 2023-11-06 LAB — HCG, QUANTITATIVE, PREGNANCY: hCG, Beta Chain, Quant, S: 152949 m[IU]/mL — ABNORMAL HIGH (ref <5)

## 2023-11-06 MED ORDER — ONDANSETRON 4 MG PO TBDP
4.0000 mg | ORAL_TABLET | Freq: Once | ORAL | Status: AC
  Administered 2023-11-06: 4 mg via ORAL
  Filled 2023-11-06: qty 1

## 2023-11-06 NOTE — ED Triage Notes (Signed)
 Patient ambulatory to triage with complaints of vomiting since this morning and cannot keep anything down. States her abdomen hurts now from vomiting so much. Patient states pregnancy is possible.

## 2023-11-06 NOTE — ED Provider Notes (Incomplete)
 [facility] Provider Note    Event Date/Time   First MD Initiated Contact with Patient 11/06/23 2300     (approximate)   History   Emesis   HPI  [name] [name] is a 28 y.o. female   Past medical history of no significant past medical history presents emerged part with nausea and vomiting over the last 2 weeks, worsening today.  No abdominal pain.  Feels that her abs are sore after vomiting so many times.  She thinks she may be pregnant.  This has not been confirmed by testing yet.  She denies any urinary symptoms like frequency or dysuria.  She denies any GI bleeding.  She denies any vaginal bleeding.  She has been pregnant in the past, 3 times, and has had nausea and vomiting associated with her pregnancy and this feels similar.  It has not been this severe before.  She has been on Zofran  in the past and this does help significantly.   External Medical Documents Reviewed: Prior outpatient notes including an obstetrics/gynecology note in December 2020 for for Mirena  IUD removal      Physical Exam   Triage Vital Signs: ED Triage Vitals [11/06/23 1950]  Encounter Vitals Group     BP (!) 143/87     Girls Systolic BP Percentile      Girls Diastolic BP Percentile      Boys Systolic BP Percentile      Boys Diastolic BP Percentile      Pulse Rate 84     Resp 18     Temp 98.9 F (37.2 C)     Temp Source Oral     SpO2 100 %     Weight 215 lb (97.5 kg)     Height 5' 3 (1.6 m)     Head Circumference      Peak Flow      Pain Score 4     Pain Loc      Pain Education      Exclude from Growth Chart     Most recent vital signs: Vitals:   11/06/23 1950 11/06/23 2321  BP: (!) 143/87 119/67  Pulse: 84 87  Resp: 18 18  Temp: 98.9 F (37.2 C) 98.9 F (37.2 C)  SpO2: 100% 100%    General: Awake, no distress.  CV:  Good peripheral perfusion.  Resp:  Normal effort.  Abd:  No distention.  Other:  Nonfocal nonperitoneal abdominal exam.  Normal  vital signs despite initial slightly elevated blood pressure on recheck is normalized.  Afebrile.  Skin appears warm well-perfused.   ED Results / Procedures / Treatments   Labs (all labs ordered are listed, but only abnormal results are displayed) Labs Reviewed  COMPREHENSIVE METABOLIC PANEL WITH GFR - Abnormal; Notable for the following components:      Result Value   Glucose, Bld 112 (*)    All other components within normal limits  CBC - Abnormal; Notable for the following components:   WBC 15.9 (*)    RBC 5.33 (*)    MCV 79.9 (*)    All other components within normal limits  URINALYSIS, ROUTINE W REFLEX MICROSCOPIC - Abnormal; Notable for the following components:   Color, Urine YELLOW (*)    APPearance CLOUDY (*)    Ketones, ur 80 (*)    Protein, ur 100 (*)    Leukocytes,Ua SMALL (*)    Bacteria, UA RARE (*)    All other components within normal  limits  HCG, QUANTITATIVE, PREGNANCY - Abnormal; Notable for the following components:   hCG, Beta Chain, Quant, S 152,949 (*)    All other components within normal limits  POC URINE PREG, ED - Abnormal; Notable for the following components:   Preg Test, Ur Positive (*)    All other components within normal limits  LIPASE, BLOOD     I ordered and reviewed the above labs they are notable for ketones in the urine.  Contaminated urine sample with 6 squamous epithelial cells, mild leukocytosis 15.9, and positive pregnancy test.  RADIOLOGY I independently reviewed and interpreted pelvic ultrasound and see intrauterine pregnancy I also reviewed radiologist's formal read.   PROCEDURES:  Critical Care performed: No  Procedures   MEDICATIONS ORDERED IN ED: Medications  ondansetron  (ZOFRAN -ODT) disintegrating tablet 4 mg (4 mg Oral Given 11/06/23 2320)    IMPRESSION / MDM / ASSESSMENT AND PLAN / ED COURSE  I reviewed the triage vital signs and the nursing notes.                                Patient's presentation is  most consistent with acute presentation with potential threat to life or bodily function.  Differential diagnosis includes, but is not limited to, pregnancy or pregnancy related complication, ectopic pregnancy, hyperemesis gravidarum, electrolyte derangement dehydration, considered obstruction or intra-abdominal infection   The patient is on the cardiac monitor to evaluate for evidence of arrhythmia and/or significant heart rate changes.  MDM:    First trimester pregnancy with live intrauterine pregnancy identified on ultrasound with nausea and vomiting similar to her episodes of nausea and vomiting associated with early pregnancy in the past.  Fortunately with a nonfocal nonperitoneal exam of her abdomen I doubt intra-abdominal infection obstruction or other surgical pathologies.  Vital signs are normal, and electrolytes and labs unremarkable aside from a mild leukocytosis which is nonspecific especially in the setting of vomiting.  No reports of vaginal bleeding, Rh+, defer RhoGAM.  Urinalysis shows ketones consistent with her nausea and vomiting over the last 2 weeks worsening today, as well as a contaminated urine sample with squamous epithelial cells and bacteruria will defer antibiotics given no symptoms low suspicion for UTI.  I will give her oral Zofran  as this has worked markedly well in the past and trialed p.o. intake.  If tolerating will give a prescription for the same and have her follow-up with OB/Gyn.  I considered hospitalization for admission or observation however given her well appearance, unremarkable labs, and ability to tolerate p.o. after antiemetic, I think she can be managed as outpatient follow-up with obstetrics for pregnancy.        FINAL CLINICAL IMPRESSION(S) / ED DIAGNOSES   Final diagnoses:  None     Rx / DC Orders   ED Discharge Orders     None        Note:  This document was prepared using Dragon voice recognition software and may include  unintentional dictation errors.

## 2023-11-06 NOTE — ED Provider Notes (Signed)
 [facility] Provider Note    Event Date/Time   First MD Initiated Contact with Patient 11/06/23 2300     (approximate)   History   Emesis   HPI  [name] [name] is a 28 y.o. female   Past medical history of no significant past medical history presents emerged part with nausea and vomiting over the last 2 weeks, worsening today.  No abdominal pain.  Feels that her abs are sore after vomiting so many times.  She thinks she may be pregnant.  This has not been confirmed by testing yet.  She denies any urinary symptoms like frequency or dysuria.  She denies any GI bleeding.  She denies any vaginal bleeding.  She has been pregnant in the past, 3 times, and has had nausea and vomiting associated with her pregnancy and this feels similar.  It has not been this severe before.  She has been on Zofran  in the past and this does help significantly.   External Medical Documents Reviewed: Prior outpatient notes including an obstetrics/gynecology note in December 2020 for for Mirena  IUD removal      Physical Exam   Triage Vital Signs: ED Triage Vitals [11/06/23 1950]  Encounter Vitals Group     BP (!) 143/87     Girls Systolic BP Percentile      Girls Diastolic BP Percentile      Boys Systolic BP Percentile      Boys Diastolic BP Percentile      Pulse Rate 84     Resp 18     Temp 98.9 F (37.2 C)     Temp Source Oral     SpO2 100 %     Weight 215 lb (97.5 kg)     Height 5' 3 (1.6 m)     Head Circumference      Peak Flow      Pain Score 4     Pain Loc      Pain Education      Exclude from Growth Chart     Most recent vital signs: Vitals:   11/06/23 1950 11/06/23 2321  BP: (!) 143/87 119/67  Pulse: 84 87  Resp: 18 18  Temp: 98.9 F (37.2 C) 98.9 F (37.2 C)  SpO2: 100% 100%    General: Awake, no distress.  CV:  Good peripheral perfusion.  Resp:  Normal effort.  Abd:  No distention.  Other:  Nonfocal nonperitoneal abdominal exam.  Normal  vital signs despite initial slightly elevated blood pressure on recheck is normalized.  Afebrile.  Skin appears warm well-perfused.   ED Results / Procedures / Treatments   Labs (all labs ordered are listed, but only abnormal results are displayed) Labs Reviewed  COMPREHENSIVE METABOLIC PANEL WITH GFR - Abnormal; Notable for the following components:      Result Value   Glucose, Bld 112 (*)    All other components within normal limits  CBC - Abnormal; Notable for the following components:   WBC 15.9 (*)    RBC 5.33 (*)    MCV 79.9 (*)    All other components within normal limits  URINALYSIS, ROUTINE W REFLEX MICROSCOPIC - Abnormal; Notable for the following components:   Color, Urine YELLOW (*)    APPearance CLOUDY (*)    Ketones, ur 80 (*)    Protein, ur 100 (*)    Leukocytes,Ua SMALL (*)    Bacteria, UA RARE (*)    All other components within normal  limits  HCG, QUANTITATIVE, PREGNANCY - Abnormal; Notable for the following components:   hCG, Beta Chain, Quant, S 152,949 (*)    All other components within normal limits  POC URINE PREG, ED - Abnormal; Notable for the following components:   Preg Test, Ur Positive (*)    All other components within normal limits  LIPASE, BLOOD     I ordered and reviewed the above labs they are notable for ketones in the urine.  Contaminated urine sample with 6 squamous epithelial cells, mild leukocytosis 15.9, and positive pregnancy test.  RADIOLOGY I independently reviewed and interpreted pelvic ultrasound and see intrauterine pregnancy I also reviewed radiologist's formal read.   PROCEDURES:  Critical Care performed: No  Procedures   MEDICATIONS ORDERED IN ED: Medications  ondansetron  (ZOFRAN -ODT) disintegrating tablet 4 mg (4 mg Oral Given 11/06/23 2320)    IMPRESSION / MDM / ASSESSMENT AND PLAN / ED COURSE  I reviewed the triage vital signs and the nursing notes.                                Patient's presentation is  most consistent with acute presentation with potential threat to life or bodily function.  Differential diagnosis includes, but is not limited to, pregnancy or pregnancy related complication, ectopic pregnancy, hyperemesis gravidarum, electrolyte derangement dehydration, considered obstruction or intra-abdominal infection   The patient is on the cardiac monitor to evaluate for evidence of arrhythmia and/or significant heart rate changes.  MDM:    First trimester pregnancy with live intrauterine pregnancy identified on ultrasound with nausea and vomiting similar to her episodes of nausea and vomiting associated with early pregnancy in the past.  Fortunately with a nonfocal nonperitoneal exam of her abdomen I doubt intra-abdominal infection obstruction or other surgical pathologies.  Vital signs are normal, and electrolytes and labs unremarkable aside from a mild leukocytosis which is nonspecific especially in the setting of vomiting.  No reports of vaginal bleeding, Rh+, defer RhoGAM.  Urinalysis shows ketones consistent with her nausea and vomiting over the last 2 weeks worsening today, as well as a contaminated urine sample with squamous epithelial cells and bacteruria will defer antibiotics given no symptoms low suspicion for UTI.  I will give her oral Zofran  as this has worked markedly well in the past and trialed p.o. intake.  If tolerating will give a prescription for the same and have her follow-up with OB/Gyn.  I considered hospitalization for admission or observation however given her well appearance, unremarkable labs, and ability to tolerate p.o. after antiemetic, I think she can be managed as outpatient follow-up with obstetrics for pregnancy.  Able to tolerate p.o. intake, I will have her discharged with a prescription for Zofran  and follow-up with obstetrics.  She understands to return with any new or worsening symptoms.        FINAL CLINICAL IMPRESSION(S) / ED DIAGNOSES   Final  diagnoses:  Nausea/vomiting in pregnancy     Rx / DC Orders   ED Discharge Orders          Ordered    ondansetron  (ZOFRAN -ODT) 4 MG disintegrating tablet  Every 8 hours PRN        11/07/23 0022             Note:  This document was prepared using Dragon voice recognition software and may include unintentional dictation errors.    Cyrena Mylar, MD 11/07/23 [phone]

## 2023-11-07 MED ORDER — ONDANSETRON 4 MG PO TBDP: 4.0000 mg | ORAL_TABLET | Freq: Three times a day (TID) | ORAL | 0 refills | Status: AC | PRN

## 2023-11-07 NOTE — ED Notes (Signed)
 Pt given DC instructions. Pt verbalized understanding of medication and follow up care. Pt ambulatory from ED without difficulty. NAD noted at time of departure.

## 2023-11-07 NOTE — Discharge Instructions (Addendum)
 You are approximately [redacted] weeks pregnant.  Take nausea medication as prescribed.  Drink plenty of fluids to stay well-hydrated.  Find Pedialyte or similar electrolyte rehydration formulas at your local pharmacy.  Call Dr. Verdon at [facility] gynecology for follow-up appointment.  Thank you for choosing us  for your health care today!  Please see your primary doctor this week for a follow up appointment.   If you have any new, worsening, or unexpected symptoms call your doctor right away or come back to the emergency department for reevaluation.  It was my pleasure to care for you today.   Ginnie EDISON Cyrena, MD

## 2023-11-27 DIAGNOSIS — Z419 Encounter for procedure for purposes other than remedying health state, unspecified: Secondary | ICD-10-CM | POA: Diagnosis not present

## 2023-12-27 DIAGNOSIS — Z419 Encounter for procedure for purposes other than remedying health state, unspecified: Secondary | ICD-10-CM | POA: Diagnosis not present

## 2024-01-27 DIAGNOSIS — G43709 Chronic migraine without aura, not intractable, without status migrainosus: Secondary | ICD-10-CM | POA: Diagnosis not present

## 2024-01-27 DIAGNOSIS — F411 Generalized anxiety disorder: Secondary | ICD-10-CM | POA: Diagnosis not present

## 2024-01-27 DIAGNOSIS — F410 Panic disorder [episodic paroxysmal anxiety] without agoraphobia: Secondary | ICD-10-CM | POA: Diagnosis not present

## 2024-01-27 DIAGNOSIS — G44209 Tension-type headache, unspecified, not intractable: Secondary | ICD-10-CM | POA: Diagnosis not present
